# Patient Record
Sex: Male | Born: 1960
Health system: Southern US, Community
[De-identification: ages and names within clinical notes are randomized; demographics above are authoritative.]

## PROBLEM LIST (undated history)

## (undated) DIAGNOSIS — K2270 Barrett's esophagus without dysplasia: Secondary | ICD-10-CM

## (undated) DIAGNOSIS — K219 Gastro-esophageal reflux disease without esophagitis: Secondary | ICD-10-CM

## (undated) DIAGNOSIS — M1990 Unspecified osteoarthritis, unspecified site: Secondary | ICD-10-CM

## (undated) DIAGNOSIS — F329 Major depressive disorder, single episode, unspecified: Secondary | ICD-10-CM

## (undated) DIAGNOSIS — E039 Hypothyroidism, unspecified: Secondary | ICD-10-CM

## (undated) DIAGNOSIS — F32A Depression, unspecified: Secondary | ICD-10-CM

## (undated) DIAGNOSIS — I712 Thoracic aortic aneurysm, without rupture: Secondary | ICD-10-CM

## (undated) DIAGNOSIS — I10 Essential (primary) hypertension: Secondary | ICD-10-CM

## (undated) DIAGNOSIS — J449 Chronic obstructive pulmonary disease, unspecified: Secondary | ICD-10-CM

## (undated) HISTORY — DX: Barrett's esophagus without dysplasia: K22.70

## (undated) HISTORY — DX: Thoracic aortic aneurysm, without rupture: I71.2

## (undated) HISTORY — PX: HEMORRHOID SURGERY: SHX153

---

## 1999-02-03 ENCOUNTER — Encounter: Payer: Self-pay | Admitting: Gastroenterology

## 1999-02-03 ENCOUNTER — Ambulatory Visit (HOSPITAL_COMMUNITY): Admission: RE | Admit: 1999-02-03 | Discharge: 1999-02-03 | Payer: Self-pay | Admitting: Gastroenterology

## 1999-02-19 ENCOUNTER — Ambulatory Visit: Admission: RE | Admit: 1999-02-19 | Discharge: 1999-02-19 | Payer: Self-pay | Admitting: Gastroenterology

## 1999-05-01 ENCOUNTER — Emergency Department (HOSPITAL_COMMUNITY): Admission: EM | Admit: 1999-05-01 | Discharge: 1999-05-01 | Payer: Self-pay | Admitting: Emergency Medicine

## 1999-05-02 ENCOUNTER — Encounter: Payer: Self-pay | Admitting: Emergency Medicine

## 1999-09-29 DIAGNOSIS — K2270 Barrett's esophagus without dysplasia: Secondary | ICD-10-CM | POA: Insufficient documentation

## 1999-09-29 HISTORY — DX: Barrett's esophagus without dysplasia: K22.70

## 1999-09-29 HISTORY — PX: ESOPHAGOGASTRODUODENOSCOPY: SHX1529

## 1999-12-01 ENCOUNTER — Other Ambulatory Visit: Admission: RE | Admit: 1999-12-01 | Discharge: 1999-12-01 | Payer: Self-pay | Admitting: Gastroenterology

## 1999-12-01 ENCOUNTER — Encounter (INDEPENDENT_AMBULATORY_CARE_PROVIDER_SITE_OTHER): Payer: Self-pay | Admitting: Specialist

## 2000-12-10 ENCOUNTER — Emergency Department (HOSPITAL_COMMUNITY): Admission: EM | Admit: 2000-12-10 | Discharge: 2000-12-10 | Payer: Self-pay | Admitting: Emergency Medicine

## 2000-12-10 ENCOUNTER — Encounter: Payer: Self-pay | Admitting: Emergency Medicine

## 2002-06-26 ENCOUNTER — Observation Stay (HOSPITAL_COMMUNITY): Admission: EM | Admit: 2002-06-26 | Discharge: 2002-06-27 | Payer: Self-pay | Admitting: *Deleted

## 2002-06-26 ENCOUNTER — Encounter: Payer: Self-pay | Admitting: Internal Medicine

## 2002-12-17 ENCOUNTER — Emergency Department (HOSPITAL_COMMUNITY): Admission: EM | Admit: 2002-12-17 | Discharge: 2002-12-17 | Payer: Self-pay | Admitting: Emergency Medicine

## 2004-10-16 ENCOUNTER — Ambulatory Visit: Payer: Self-pay | Admitting: Gastroenterology

## 2005-02-13 ENCOUNTER — Emergency Department (HOSPITAL_COMMUNITY): Admission: EM | Admit: 2005-02-13 | Discharge: 2005-02-13 | Payer: Self-pay | Admitting: Family Medicine

## 2005-03-19 ENCOUNTER — Emergency Department (HOSPITAL_COMMUNITY): Admission: EM | Admit: 2005-03-19 | Discharge: 2005-03-19 | Payer: Self-pay | Admitting: Emergency Medicine

## 2005-11-09 ENCOUNTER — Emergency Department (HOSPITAL_COMMUNITY): Admission: EM | Admit: 2005-11-09 | Discharge: 2005-11-09 | Payer: Self-pay | Admitting: Emergency Medicine

## 2010-02-18 ENCOUNTER — Emergency Department (HOSPITAL_COMMUNITY): Admission: EM | Admit: 2010-02-18 | Discharge: 2010-02-18 | Payer: Self-pay | Admitting: Emergency Medicine

## 2010-10-28 ENCOUNTER — Emergency Department (HOSPITAL_COMMUNITY)
Admission: EM | Admit: 2010-10-28 | Discharge: 2010-10-28 | Payer: Self-pay | Source: Home / Self Care | Admitting: Emergency Medicine

## 2010-12-15 LAB — CBC
HCT: 40.5 % (ref 39.0–52.0)
Hemoglobin: 13.9 g/dL (ref 13.0–17.0)
MCHC: 34.3 g/dL (ref 30.0–36.0)
MCV: 83.4 fL (ref 78.0–100.0)
RBC: 4.85 MIL/uL (ref 4.22–5.81)
RDW: 12.9 % (ref 11.5–15.5)

## 2010-12-15 LAB — BASIC METABOLIC PANEL
BUN: 14 mg/dL (ref 6–23)
CO2: 26 mEq/L (ref 19–32)
Calcium: 9 mg/dL (ref 8.4–10.5)
Chloride: 104 mEq/L (ref 96–112)
Creatinine, Ser: 0.66 mg/dL (ref 0.4–1.5)
GFR calc Af Amer: >60 mL/min (ref >60)
GFR calc non Af Amer: >60 mL/min (ref >60)
Glucose, Bld: 122 mg/dL — ABNORMAL HIGH (ref 70–99)
Potassium: 3.8 mEq/L (ref 3.5–5.1)
Sodium: 137 mEq/L (ref 135–145)

## 2010-12-15 LAB — DIFFERENTIAL
Basophils Absolute: 0 10*3/uL (ref 0.0–0.1)
Basophils Relative: 1 % (ref 0–1)
Eosinophils Relative: 4 % (ref 0–5)
Monocytes Absolute: 0.5 10*3/uL (ref 0.1–1.0)
Monocytes Relative: 7 % (ref 3–12)

## 2011-11-16 ENCOUNTER — Encounter (HOSPITAL_COMMUNITY): Payer: Self-pay | Admitting: Surgery

## 2011-11-16 ENCOUNTER — Encounter (HOSPITAL_COMMUNITY): Payer: Self-pay | Admitting: Pharmacy Technician

## 2011-11-17 ENCOUNTER — Other Ambulatory Visit: Payer: Self-pay

## 2011-11-17 ENCOUNTER — Other Ambulatory Visit (HOSPITAL_COMMUNITY): Payer: Self-pay

## 2011-11-17 ENCOUNTER — Encounter (HOSPITAL_COMMUNITY): Payer: Self-pay

## 2011-11-17 ENCOUNTER — Ambulatory Visit (HOSPITAL_COMMUNITY)
Admission: RE | Admit: 2011-11-17 | Discharge: 2011-11-17 | Disposition: A | Discharge status: Home / Self Care | Payer: PRIVATE HEALTH INSURANCE | Source: Ambulatory Visit | Attending: Anesthesiology | Admitting: Anesthesiology

## 2011-11-17 ENCOUNTER — Encounter (HOSPITAL_COMMUNITY)
Admission: RE | Admit: 2011-11-17 | Discharge: 2011-11-17 | Disposition: A | Discharge status: Home / Self Care | Payer: PRIVATE HEALTH INSURANCE | Source: Ambulatory Visit | Attending: Oral Surgery | Admitting: Oral Surgery

## 2011-11-17 DIAGNOSIS — J4489 Other specified chronic obstructive pulmonary disease: Secondary | ICD-10-CM | POA: Insufficient documentation

## 2011-11-17 DIAGNOSIS — Z01812 Encounter for preprocedural laboratory examination: Secondary | ICD-10-CM | POA: Insufficient documentation

## 2011-11-17 DIAGNOSIS — Z01810 Encounter for preprocedural cardiovascular examination: Secondary | ICD-10-CM | POA: Insufficient documentation

## 2011-11-17 DIAGNOSIS — Z01818 Encounter for other preprocedural examination: Secondary | ICD-10-CM | POA: Insufficient documentation

## 2011-11-17 DIAGNOSIS — J449 Chronic obstructive pulmonary disease, unspecified: Secondary | ICD-10-CM | POA: Insufficient documentation

## 2011-11-17 DIAGNOSIS — I10 Essential (primary) hypertension: Secondary | ICD-10-CM | POA: Insufficient documentation

## 2011-11-17 LAB — BASIC METABOLIC PANEL
BUN: 17 mg/dL (ref 6–23)
Calcium: 10 mg/dL (ref 8.4–10.5)
Creatinine, Ser: 0.77 mg/dL (ref 0.50–1.35)
GFR calc Af Amer: >90 mL/min (ref >90)

## 2011-11-17 LAB — CBC
HCT: 41.1 % (ref 39.0–52.0)
MCHC: 33.1 g/dL (ref 30.0–36.0)
MCV: 83.4 fL (ref 78.0–100.0)
Platelets: 186 10*3/uL (ref 150–400)
RDW: 12.7 % (ref 11.5–15.5)
WBC: 7 10*3/uL (ref 4.0–10.5)

## 2011-11-17 NOTE — Consult Note (Addendum)
Anesthesia:  Patient is a 51 year old male scheduled for multiple teeth extractions (15) and alveoloplasty on 11/30/11.  History includes hypothyroidism, GERD, former smoker (quit in 2005), COPD, SOB, depression, and questionable for HTN (listed on Dr. Randa Evens notes but patient is not on any meds and his BP is 114/75 today).  His PCP is Dr. Fleet Contras at the [facility] [phone]).   During his PAT appointment today he mentioned an episode of chest pain that occurred 4 days ago while working on the [employer].  He is doing temporary work at [employer] and that particular evening he was having to lift and move heavy boxes.  He developed substernal pain that lasted a few minutes.  He could not characterize his pain for me except to say it was non-radiating and was a 2-3/10.  It went away without specific treatment.  He did not have any associated symptoms such as nausea, palpitations, presyncope, or jaw or arm pain.  He was sweating, but says that was related to work.  He had prior CP in 2003, but reportedly ruled out for MI.  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  He does have a family history (father) of MI X 2, first around age 14.  He denies SOB at rest, but has some DOE with moderate exertion.   He denies LE edema.   Labs are acceptable.  Glucose is 100.  CXR shows no active disease.  EKG shows SB, LAD.  It does not appear significantly changed since May 2011.  Exam shows his heart with a RRR, no murmur noted, lungs clear, no carotid bruits noted.    He initially was medically cleared by Dr. Concepcion Elk, but this was prior to his episode of chest pain.  Due to his recent chest pain with family history of MI before age 19 would recommend further evaluation pre-operatively.  This could either be a Cardiology evaluation with additional testing per their orders or an out-patient stress test arranged by his PCP.  An appointment has been made for him to  see Dr. Antoine Poche on 11/30/11 at 11:15 AM; however this would require him to reschedule his dental procedure.  He is also concerned about the cost.  His primary insurance is [employer] [phone], rep. Olivia Canter).  I contacted Dr. Albertina Parr office and have been told that any appointments need pre-approval, and that Dr. Concepcion Elk could not even see him or order a stress test unless it was approved.  I have left Olivia Canter a message to contact me to clarify what needs to be done for him to get his exertional CP further evaluated.  In the meantime, Mr. Salmons is aware that he should contact EMS for recurrent CP symptoms.  I have updated Anesthesiologist Dr. Ivin Booty and Lyla Son at Dr. Randa Evens office.    I'll follow-up once I hear back from [facility].  Addendum: 11/18/11 1300  I spoke with Olivia Canter earlier today.  They will approve a pre-operative Cardiology evaluation.  She was able to reschedule Mr. Seales appointment to 11/25/11 @ 2:30PM with Dr. Charlton Haws.  I've asked the Short Stay staff to follow-up with his recommendations.  I have notified Mr. Delman and updated Lyla Son at Dr. Randa Evens office.

## 2011-11-17 NOTE — Pre-Procedure Instructions (Signed)
20 Darren [name]  11/17/2011   Your procedure is scheduled on:  Monday  11/30/11   Report to [facility] at 530 AM.  Call this number if you have problems the morning of surgery: [phone]   Remember:   Do not eat food:After Midnight.  May have clear liquids: up to 4 Hours before arrival.  Clear liquids include soda, tea, black coffee, apple or grape juice, broth.  Take these medicines the morning of surgery with A SIP OF WATER:  Albuterol, symbicort, lexapro, pepcid, synthroid   Do not wear jewelry, make-up or nail polish.  Do not wear lotions, powders, or perfumes. You may wear deodorant.  Do not shave 48 hours prior to surgery.  Do not bring valuables to the hospital.  Contacts, dentures or bridgework may not be worn into surgery.  Leave suitcase in the car. After surgery it may be brought to your room.  For patients admitted to the hospital, checkout time is 11:00 AM the day of discharge.   Patients discharged the day of surgery will not be allowed to drive home.  Name and phone number of your driver:   Special Instructions: CHG Shower Use Special Wash: 1/2 bottle night before surgery and 1/2 bottle morning of surgery.   Please read over the following fact sheets that you were given: Pain Booklet, MRSA Information and Surgical Site Infection Prevention

## 2011-11-17 NOTE — Progress Notes (Signed)
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 PATIENT STATES  HE HAS ONLY HAD STRESS TEST X1 YEARS AGO.  PATIENT [facility].

## 2011-11-18 ENCOUNTER — Telehealth: Payer: Self-pay | Admitting: *Deleted

## 2011-11-18 NOTE — Telephone Encounter (Signed)
Pt rescheduled to 2/27 at 2:30 pm with Dr Eden Emms.  Left message for Beth on her voicemail and for pt at home number (per Glynda Jaeger)

## 2011-11-18 NOTE — Telephone Encounter (Signed)
New msg [facility] wanted to talk to you about surgery he has scheduled. Please call

## 2011-11-18 NOTE — Telephone Encounter (Signed)
Per Beth ext [phone] - pt needs surgical clearance before 11/30/2011 which is when he is scheduled to see Dr Antoine Poche.  Glynda Jaeger aware and is working on re-scheduling the pt.

## 2011-11-25 ENCOUNTER — Encounter: Payer: Self-pay | Admitting: Cardiovascular Disease

## 2011-11-25 ENCOUNTER — Ambulatory Visit (INDEPENDENT_AMBULATORY_CARE_PROVIDER_SITE_OTHER): Payer: PRIVATE HEALTH INSURANCE | Admitting: Cardiovascular Disease

## 2011-11-25 DIAGNOSIS — R079 Chest pain, unspecified: Secondary | ICD-10-CM | POA: Insufficient documentation

## 2011-11-25 DIAGNOSIS — J45909 Unspecified asthma, uncomplicated: Secondary | ICD-10-CM | POA: Insufficient documentation

## 2011-11-25 DIAGNOSIS — Z01818 Encounter for other preprocedural examination: Secondary | ICD-10-CM

## 2011-11-25 DIAGNOSIS — K219 Gastro-esophageal reflux disease without esophagitis: Secondary | ICD-10-CM | POA: Insufficient documentation

## 2011-11-25 NOTE — Progress Notes (Signed)
Patient ID: Darren [name], male   DOB: 06-Aug-1961, 51 y.o.   MRN: [id number] 51 yo needs oral surgery.  Had some SSCP when pulling a heavy cart at work.  Had stress test many years ago  Nonsmoker.  Previous hemmoroid surgery in 80's with no complications.  Will have ? General anesthesia for procedure because of the extensive tooth decay.  Mild exertional dyspnea.  Does get occasional pressure when walking his  American bulldog Chance.  No palpitations syncope or edema.    ROS: Denies fever, malais, weight loss, blurry vision, decreased visual acuity, cough, sputum, SOB, hemoptysis, pleuritic pain, palpitaitons, heartburn, abdominal pain, melena, lower extremity edema, claudication, or rash.  All other systems reviewed and negative   General: Affect appropriate Healthy:  appears stated age HEENT: normal Neck supple with no adenopathy JVP normal no bruits no thyromegaly Lungs clear with no wheezing and good diaphragmatic motion Heart:  S1/S2 no murmur,rub, gallop or click PMI normal Abdomen: benighn, BS positve, no tenderness, no AAA no bruit.  No HSM or HJR Distal pulses intact with no bruits No edema Neuro non-focal Skin warm and dry No muscular weakness  Medications Current Outpatient Prescriptions  Medication Sig Dispense Refill  . albuterol (PROVENTIL HFA;VENTOLIN HFA) 108 (90 BASE) MCG/ACT inhaler Inhale 2 puffs into the lungs every 4 (four) hours as needed. For shortness of breath      . budesonide-formoterol (SYMBICORT) 160-4.5 MCG/ACT inhaler Inhale 2 puffs into the lungs 2 (two) times daily.      Marland Kitchen escitalopram (LEXAPRO) 10 MG tablet Take 20 mg by mouth daily.      . famotidine (PEPCID) 20 MG tablet Take 20 mg by mouth daily. For reflux       . levothyroxine (SYNTHROID, LEVOTHROID) 175 MCG tablet Take 175 mcg by mouth daily.      . naproxen sodium (ANAPROX) 220 MG tablet Take 220 mg by mouth 2 (two) times daily with a meal.        Allergies Review of patient's allergies  indicates no known allergies.  Family History: No family history on file.  Social History: History   Social History  . Marital Status: Single    Spouse Name: N/A    Number of Children: N/A  . Years of Education: N/A   Occupational History  . Not on file.   Social History Main Topics  . Smoking status: Former Games developer  . Smokeless tobacco: Not on file  . Alcohol Use: Yes     ON WEEKENDS  . Drug Use: No  . Sexually Active:    Other Topics Concern  . Not on file   Social History Narrative  . No narrative on file    Electrocardiogram:  11/17/11  Normal sinus rhythm  Normal ecg  Assessment and Plan

## 2011-11-25 NOTE — Assessment & Plan Note (Signed)
Continue H2 blocker and low carb diet 

## 2011-11-25 NOTE — Patient Instructions (Signed)
Your physician recommends that you schedule a follow-up appointment in: AS NEEDED Your physician recommends that you continue on your current medications as directed. Please refer to the Current Medication list given to you today Your physician has requested that you have an exercise tolerance test. For further information please visit [email or web address] Please also follow instruction sheet, as given. DX PRE OP  CHEST PAIN

## 2011-11-25 NOTE — Assessment & Plan Note (Signed)
PRN inhaler stopped smoking 8 years ago.  F/U primary

## 2011-11-25 NOTE — Assessment & Plan Note (Signed)
Atypical  F/U exercise treadmill

## 2011-11-26 ENCOUNTER — Encounter: Payer: PRIVATE HEALTH INSURANCE | Admitting: Cardiovascular Disease

## 2011-11-26 ENCOUNTER — Ambulatory Visit (INDEPENDENT_AMBULATORY_CARE_PROVIDER_SITE_OTHER): Payer: PRIVATE HEALTH INSURANCE | Admitting: Cardiovascular Disease

## 2011-11-26 ENCOUNTER — Encounter: Payer: Self-pay | Admitting: *Deleted

## 2011-11-26 DIAGNOSIS — R079 Chest pain, unspecified: Secondary | ICD-10-CM

## 2011-11-26 DIAGNOSIS — Z01818 Encounter for other preprocedural examination: Secondary | ICD-10-CM

## 2011-11-26 NOTE — Progress Notes (Signed)
Exercise Treadmill Test  Pre-Exercise Testing Evaluation Rhythm: normal sinus  Rate: 72   PR:  20 QRS:  .04  QT:  .35 QTc: .36   P axis: +45 degrees  QRS axis:  +60 degrees  ST Segments:  no significant ST changes at rest     Test  Exercise Tolerance Test Ordering MD: Charlton Haws, MD  Interpreting MD:  Charlton Haws, MD  Unique Test No: 1 Treadmill:  1  Indication for ETT: chest pain - rule out ischemia Contraindication to ETT: No   Stress Modality: exercise - treadmill  Cardiac Imaging Performed: non   Protocol: standard [name] - maximal  Max BP:  178/88   Max MPHR (bpm):  170 85% MPR (bpm):  145  MPHR obtained (bpm):  158 % MPHR obtained:  93  Reached 85% MPHR (min:sec):  5:00 Total Exercise Time (min-sec):  6:00  Workload in METS:  7.0 Borg Scale: 16  Reason ETT Terminated:  fatigue    ST Segment Analysis At Rest: normal ST segments - no evidence of significant ST depression With Exercise: no evidence of significant ST depression  Other Information Arrhythmia:  No Angina during ETT:  absent (0) Quality of ETT:  diagnostic  ETT Interpretation:  normal - no evidence of ischemia by ST analysis  Comments: Patient had difficulty negotiating treadmill  Recommendations: Normal ETT with no evidence of ischemia

## 2011-11-26 NOTE — H&P (Signed)
HISTORY AND PHYSICAL  Darren [name] is a 51 y.o. male patient with [id number] teeth.  No diagnosis found.  Past Medical History  Diagnosis Date  . Hypothyroidism     per Medical Clearance form.  Marland Kitchen GERD (gastroesophageal reflux disease)     per Medical clearance form.  . Arthritis     per Medical clearance form.  Marland Kitchen COPD (chronic obstructive pulmonary disease)     per medical clearance form.  . Depression     per Medical Clearance form(09/18/11)  . Hypertension     Per Medical Clearance form.  . Shortness of breath     WITH EXERTION   . Frequency of urination   . Angina     PATIENT STATES HE HAD EPISODE OF PAIN IN CHEST WHEN PULLING AND STRAINING AT WORK    No current facility-administered medications for this encounter.   Current Outpatient Prescriptions  Medication Sig Dispense Refill  . albuterol (PROVENTIL HFA;VENTOLIN HFA) 108 (90 BASE) MCG/ACT inhaler Inhale 2 puffs into the lungs every 4 (four) hours as needed. For shortness of breath      . budesonide-formoterol (SYMBICORT) 160-4.5 MCG/ACT inhaler Inhale 2 puffs into the lungs 2 (two) times daily.      Marland Kitchen escitalopram (LEXAPRO) 10 MG tablet Take 20 mg by mouth daily.      . famotidine (PEPCID) 20 MG tablet Take 20 mg by mouth daily. For reflux       . levothyroxine (SYNTHROID, LEVOTHROID) 175 MCG tablet Take 175 mcg by mouth daily.      . naproxen sodium (ANAPROX) 220 MG tablet Take 220 mg by mouth 2 (two) times daily with a meal.       No Known Allergies Active Problems:  * No active hospital problems. *   Vitals: There were no vitals taken for this visit. Lab results:No results found for this or any previous visit (from the past 24 hour(s)). Radiology Results: No results found. General appearance: alert, cooperative and mildly obese Head: Normocephalic, without obvious abnormality, atraumatic Eyes: conjunctivae/corneas clear. PERRL, EOM's intact. Fundi benign. Ears: normal TM's and external ear canals both  ears Nose: Nares normal. Septum midline. Mucosa normal. No drainage or sinus tenderness. Throat: Gross dental caries teeth #'s 2, 3, 4, 5, 6, 7, 8, 9, 10, 11, 12, 13, 15, 18, 31 Neck: no adenopathy, no carotid bruit, no JVD, supple, symmetrical, trachea midline and thyroid not enlarged, symmetric, no tenderness/mass/nodules Resp: clear to auscultation bilaterally Cardio: regular rate and rhythm, S1, S2 normal, no murmur, click, rub or gallop  Assessment: 50 WM HTN, Depression GERD, Angina with nonrestorable teeth  #'s 2, 3, 4, 5, 6, 7, 8, 9, 10, 11, 12, 13, 15, 18, 31. Plan: Extraction teeth #'s  2, 3, 4, 5, 6, 7, 8, 9, 10, 11, 12, 13, 15, 18, 31 alveoloplasty. General anesthesia. Day surgery.   Georgia Lopes 11/26/2011

## 2011-11-30 ENCOUNTER — Ambulatory Visit (HOSPITAL_COMMUNITY)
Admission: RE | Admit: 2011-11-30 | Discharge: 2011-11-30 | Disposition: A | Discharge status: Home / Self Care | Payer: PRIVATE HEALTH INSURANCE | Source: Ambulatory Visit | Attending: Oral Surgery | Admitting: Oral Surgery

## 2011-11-30 ENCOUNTER — Encounter (HOSPITAL_COMMUNITY): Payer: Self-pay | Admitting: Vascular Surgery

## 2011-11-30 ENCOUNTER — Ambulatory Visit: Payer: PRIVATE HEALTH INSURANCE | Admitting: Cardiology

## 2011-11-30 ENCOUNTER — Encounter (HOSPITAL_COMMUNITY)
Admission: RE | Disposition: A | Discharge status: Home / Self Care | Payer: Self-pay | Source: Ambulatory Visit | Attending: Oral Surgery

## 2011-11-30 ENCOUNTER — Ambulatory Visit (HOSPITAL_COMMUNITY): Payer: PRIVATE HEALTH INSURANCE | Admitting: Vascular Surgery

## 2011-11-30 DIAGNOSIS — J449 Chronic obstructive pulmonary disease, unspecified: Secondary | ICD-10-CM | POA: Insufficient documentation

## 2011-11-30 DIAGNOSIS — F329 Major depressive disorder, single episode, unspecified: Secondary | ICD-10-CM | POA: Insufficient documentation

## 2011-11-30 DIAGNOSIS — Z79899 Other long term (current) drug therapy: Secondary | ICD-10-CM | POA: Insufficient documentation

## 2011-11-30 DIAGNOSIS — F3289 Other specified depressive episodes: Secondary | ICD-10-CM | POA: Insufficient documentation

## 2011-11-30 DIAGNOSIS — E039 Hypothyroidism, unspecified: Secondary | ICD-10-CM | POA: Insufficient documentation

## 2011-11-30 DIAGNOSIS — K029 Dental caries, unspecified: Secondary | ICD-10-CM

## 2011-11-30 DIAGNOSIS — M129 Arthropathy, unspecified: Secondary | ICD-10-CM | POA: Insufficient documentation

## 2011-11-30 DIAGNOSIS — I10 Essential (primary) hypertension: Secondary | ICD-10-CM | POA: Insufficient documentation

## 2011-11-30 DIAGNOSIS — K219 Gastro-esophageal reflux disease without esophagitis: Secondary | ICD-10-CM | POA: Insufficient documentation

## 2011-11-30 DIAGNOSIS — J4489 Other specified chronic obstructive pulmonary disease: Secondary | ICD-10-CM | POA: Insufficient documentation

## 2011-11-30 HISTORY — DX: Essential (primary) hypertension: I10

## 2011-11-30 HISTORY — DX: Unspecified osteoarthritis, unspecified site: M19.90

## 2011-11-30 HISTORY — DX: Chronic obstructive pulmonary disease, unspecified: J44.9

## 2011-11-30 HISTORY — DX: Depression, unspecified: F32.A

## 2011-11-30 HISTORY — DX: Major depressive disorder, single episode, unspecified: F32.9

## 2011-11-30 HISTORY — DX: Gastro-esophageal reflux disease without esophagitis: K21.9

## 2011-11-30 HISTORY — PX: MULTIPLE EXTRACTIONS WITH ALVEOLOPLASTY: SHX5342

## 2011-11-30 HISTORY — DX: Hypothyroidism, unspecified: E03.9

## 2011-11-30 SURGERY — MULTIPLE EXTRACTION WITH ALVEOLOPLASTY
Anesthesia: General | Site: Mouth | Laterality: Bilateral | Wound class: Clean Contaminated

## 2011-11-30 MED ORDER — PROPOFOL 10 MG/ML IV EMUL
INTRAVENOUS | Status: DC | PRN
  Administered 2011-11-30: 180 mg via INTRAVENOUS

## 2011-11-30 MED ORDER — ROCURONIUM BROMIDE 100 MG/10ML IV SOLN
INTRAVENOUS | Status: DC | PRN
  Administered 2011-11-30: 30 mg via INTRAVENOUS

## 2011-11-30 MED ORDER — OXYMETAZOLINE HCL 0.05 % NA SOLN
NASAL | Status: DC | PRN
  Administered 2011-11-30: 2 via NASAL

## 2011-11-30 MED ORDER — OXYCODONE-ACETAMINOPHEN 5-325 MG PO TABS: 1.0000 | ORAL_TABLET | ORAL | Status: AC | PRN

## 2011-11-30 MED ORDER — ONDANSETRON HCL 4 MG/2ML IJ SOLN
INTRAMUSCULAR | Status: DC | PRN
  Administered 2011-11-30: 4 mg via INTRAVENOUS

## 2011-11-30 MED ORDER — SODIUM CHLORIDE 0.9 % IR SOLN
Status: DC | PRN
  Administered 2011-11-30: 1000 mL

## 2011-11-30 MED ORDER — LIDOCAINE-EPINEPHRINE 2 %-1:100000 IJ SOLN
INTRAMUSCULAR | Status: DC | PRN
  Administered 2011-11-30: 16 mL

## 2011-11-30 MED ORDER — FENTANYL CITRATE 0.05 MG/ML IJ SOLN
INTRAMUSCULAR | Status: DC | PRN
  Administered 2011-11-30 (×2): 50 ug via INTRAVENOUS

## 2011-11-30 MED ORDER — LACTATED RINGERS IV SOLN
INTRAVENOUS | Status: DC | PRN
  Administered 2011-11-30 (×2): via INTRAVENOUS

## 2011-11-30 MED ORDER — MIDAZOLAM HCL 5 MG/5ML IJ SOLN
INTRAMUSCULAR | Status: DC | PRN
  Administered 2011-11-30: 1 mg via INTRAVENOUS

## 2011-11-30 MED ORDER — CEFAZOLIN SODIUM 1-5 GM-% IV SOLN
INTRAVENOUS | Status: DC | PRN
  Administered 2011-11-30: 1 g via INTRAVENOUS

## 2011-11-30 MED ORDER — CEFAZOLIN SODIUM 1-5 GM-% IV SOLN
INTRAVENOUS | Status: AC
  Filled 2011-11-30: qty 50

## 2011-11-30 SURGICAL SUPPLY — 28 items
BUR CROSS CUT FISSURE 1.6 (BURR) ×2 IMPLANT
BUR EGG ELITE 4.0 (BURR) ×2 IMPLANT
CANISTER SUCTION 2500CC (MISCELLANEOUS) ×2 IMPLANT
CLOTH BEACON ORANGE TIMEOUT ST (SAFETY) ×2 IMPLANT
COVER SURGICAL LIGHT HANDLE (MISCELLANEOUS) ×2 IMPLANT
CRADLE DONUT ADULT HEAD (MISCELLANEOUS) ×2 IMPLANT
DECANTER SPIKE VIAL GLASS SM (MISCELLANEOUS) ×2 IMPLANT
GAUZE PACKING FOLDED 2  STR (GAUZE/BANDAGES/DRESSINGS) ×1
GAUZE PACKING FOLDED 2 STR (GAUZE/BANDAGES/DRESSINGS) ×1 IMPLANT
GLOVE BIO SURGEON STRL SZ 6.5 (GLOVE) ×2 IMPLANT
GLOVE BIO SURGEON STRL SZ7 (GLOVE) ×1 IMPLANT
GLOVE BIO SURGEON STRL SZ7.5 (GLOVE) ×2 IMPLANT
GLOVE BIOGEL PI IND STRL 7.0 (GLOVE) ×1 IMPLANT
GLOVE BIOGEL PI INDICATOR 7.0 (GLOVE) ×1
GOWN STRL NON-REIN LRG LVL3 (GOWN DISPOSABLE) ×4 IMPLANT
GOWN STRL REIN XL XLG (GOWN DISPOSABLE) ×2 IMPLANT
KIT BASIN OR (CUSTOM PROCEDURE TRAY) ×2 IMPLANT
KIT ROOM TURNOVER OR (KITS) ×2 IMPLANT
NEEDLE 22X1 1/2 (OR ONLY) (NEEDLE) ×2 IMPLANT
NS IRRIG 1000ML POUR BTL (IV SOLUTION) ×2 IMPLANT
PAD ARMBOARD 7.5X6 YLW CONV (MISCELLANEOUS) ×4 IMPLANT
SUT CHROMIC 3 0 PS 2 (SUTURE) ×3 IMPLANT
SYR 50ML SLIP (SYRINGE) IMPLANT
TOWEL OR 17X26 10 PK STRL BLUE (TOWEL DISPOSABLE) ×2 IMPLANT
TRAY ENT MC OR (CUSTOM PROCEDURE TRAY) ×2 IMPLANT
TUBING IRRIGATION (MISCELLANEOUS) ×1 IMPLANT
WATER STERILE IRR 1000ML POUR (IV SOLUTION) IMPLANT
YANKAUER SUCT BULB TIP NO VENT (SUCTIONS) ×2 IMPLANT

## 2011-11-30 NOTE — Transfer of Care (Signed)
Immediate Anesthesia Transfer of Care Note  Patient: Darren [name]  Procedure(s) Performed: Procedure(s) (LRB): MULTIPLE EXTRACION WITH ALVEOLOPLASTY (Bilateral)  Patient Location: PACU  Anesthesia Type: General  Level of Consciousness: awake, alert  and oriented  Airway & Oxygen Therapy: Patient Spontanous Breathing and Patient connected to face mask oxygen  Post-op Assessment: Report given to PACU RN, Post -op Vital signs reviewed and stable and Patient moving all extremities X 4  Post vital signs: Reviewed and stable  Complications: No apparent anesthesia complications

## 2011-11-30 NOTE — H&P (Signed)
H&P documentation  -History and Physical Reviewed  -Patient has been re-examined  -No change in the plan of care  Darren [name]  

## 2011-11-30 NOTE — Anesthesia Postprocedure Evaluation (Signed)
Anesthesia Post Note  Patient: Darren [name]  Procedure(s) Performed: Procedure(s) (LRB): MULTIPLE EXTRACION WITH ALVEOLOPLASTY (Bilateral)  Anesthesia type: General  Patient location: PACU  Post pain: Pain level controlled and Adequate analgesia  Post assessment: Post-op Vital signs reviewed, Patient's Cardiovascular Status Stable, Respiratory Function Stable, Patent Airway and Pain level controlled  Last Vitals:  Filed Vitals:   11/30/11 1019  BP:   Pulse: 82  Temp:   Resp: 15    Post vital signs: Reviewed and stable  Level of consciousness: awake, alert  and oriented  Complications: No apparent anesthesia complications

## 2011-11-30 NOTE — Op Note (Signed)
11/30/2011  9:38 AM  PATIENT:  Darren [name]  51 y.o. male  PRE-OPERATIVE DIAGNOSIS:  NONRESTORABLE TEETH #'s 2, 3, 4, 5, 6, 7, 8, 9, 10, 11, 12, 13, 15, 18, 31  POST-OPERATIVE DIAGNOSIS:  SAME  PROCEDURE:  Procedure(s): MULTIPLE EXTRACTIONNONRESTORABLE TEETH #'s 2, 3, 4, 5, 6, 7, 8, 9, 10, 11, 12, 13, 15, 18, 31  WITH ALVEOLOPLASTY   SURGEON:  Surgeon(s): Georgia Lopes, DDS  ANESTHESIA:   local and general  EBL:  minimal  DRAINS: none   LOCAL MEDICATIONS USED:  LIDOCAINE 16 CC  SPECIMEN:  No Specimen  COUNTS:  YES  PLAN OF CARE: Discharge to home after PACU  PATIENT DISPOSITION:  PACU - hemodynamically stable.   PROCEDURE DETAILS: Dictation # [id number]  Georgia Lopes, DMD 11/30/2011 9:38 AM

## 2011-11-30 NOTE — Anesthesia Preprocedure Evaluation (Addendum)
Anesthesia Evaluation  Patient identified by MRN, date of birth, ID band Patient awake    Reviewed: Allergy & Precautions, H&P , NPO status , Patient's Chart, lab work & pertinent test results  Airway Mallampati: II TM Distance: >3 FB Neck ROM: full    Dental  (+) Poor Dentition, Chipped and Dental Advisory Given   Pulmonary shortness of breath, asthma , COPDformer smoker         Cardiovascular hypertension,     Neuro/Psych PSYCHIATRIC DISORDERS Depression    GI/Hepatic GERD-  ,  Endo/Other  Hypothyroidism   Renal/GU      Musculoskeletal   Abdominal   Peds  Hematology   Anesthesia Other Findings   Reproductive/Obstetrics                          Anesthesia Physical Anesthesia Plan  ASA: II  Anesthesia Plan: General   Post-op Pain Management:    Induction: Intravenous  Airway Management Planned: Oral ETT  Additional Equipment:   Intra-op Plan:   Post-operative Plan: Extubation in OR  Informed Consent: I have reviewed the patients History and Physical, chart, labs and discussed the procedure including the risks, benefits and alternatives for the proposed anesthesia with the patient or authorized representative who has indicated his/her understanding and acceptance.     Plan Discussed with: CRNA and Surgeon  Anesthesia Plan Comments:         Anesthesia Quick Evaluation

## 2011-11-30 NOTE — Op Note (Signed)
NAME:  Darren [name] NO.:  [id number]  MEDICAL RECORD NO.:  [id number]  LOCATION:  [facility]                         FACILITY:  [facility]  PHYSICIAN:  Georgia Lopes, M.D.  DATE OF BIRTH:  May 25, 1961  DATE OF PROCEDURE:  11/30/2011 DATE OF DISCHARGE:  11/30/2011                              OPERATIVE REPORT   PREOPERATIVE DIAGNOSIS:  Nonrestorable teeth numbers 2, 3, 4, 5, 6, 7, 8, 9, 10, 11, 12, 13, 15, 18, 31.  POSTOPERATIVE DIAGNOSIS:  Nonrestorable teeth numbers 2, 3, 4, 5, 6, 7, 8, 9, 10, 11, 12, 13, 15, 18, 31.  PROCEDURE:  Removal of nonrestorable teeth numbers 2, 3, 4, 5, 6, 7, 8, 9, 10, 11, 12, 13, 15, 18, 31.  Alveoplasty, right and left maxilla.  SURGEON:  Georgia Lopes, M.D.  ANESTHESIA:  General, nasal intubation.  INDICATIONS FOR PROCEDURE:  Darren [name] is a 51 year old male with past medical history significant for hypertension, GERD and depression, who is referred by his general dentist for removal of multiple teeth secondary to dental decay and multiple abscesses.  Because of the number of teeth to be removed and the amount of surgery, it was recommended that the patient undergo the procedure while under general anesthesia and with intubation for airway protection.  PROCEDURE IN DETAIL:  The patient was taken to the operating room and placed on the table in supine position.  General anesthesia was administered intravenously and then the nasal endotracheal tube was placed and secured.  The eyes were protected.  The patient was draped for the procedure.  A time-out was performed.  Posterior pharynx was suctioned with Yankauer suction and a throat-pack was placed.  Lidocaine 2% with 1:100,000 epinephrine was infiltrated in an inferior alveolar block on the right and left side and in the maxilla and in buccal and palatal infiltration around the teeth to be removed.  A total of 16 mL was utilized.  A bite-block was placed on the right side of the  mouth. A sweetheart retractor was used to retract the tongue.  A 15 blade was used make a full-thickness incision around tooth number 18 in the mandible on the buccal and lingual aspects and around teeth numbers 15, 13, 12, 11, 10, 9, 8, 7 in the maxilla on the buccal and palatal aspects.  The periosteum was reflected from around these teeth with a periosteal elevator.  Bone was removed with a Stryker handpiece around the posterior teeth in the maxilla and mandible, and then the teeth were elevated with a 301 elevator and removed from the mouth with the upper universal forceps.  The sockets were then irrigated and curetted, and the periosteum was reflected to expose the alveolar bone in the maxilla. An egg-shape bur and bone file were used to perform alveoplasty and then the area was irrigated and closed with 3-0 chromic.  The bite-block and sweetheart were repositioned to the other side of the mouth and a 15 blade was used to make a full-thickness incision around tooth number 31 on the buccal and lingual aspects, and around teeth numbers 2, 3, 4, 5 and 6 in the maxilla on the buccal and palatal aspects.  The periosteum was then reflected with a periosteal elevator and a proximal bone was removed around these teeth with the Stryker handpiece under irrigation. The 301 elevator was used to elevate the teeth and the teeth were removed with the upper universal forceps in the maxilla and the cow-horn forceps in the mandible.  The sockets were then curetted and periosteum was further reflected to expose the alveolar bone in the maxilla.  The egg-shape bur and the bone file were used to perform the alveoplasty in this area.  Then, the areas were irrigated and closed with 3-0 chromic and the oral cavity was inspected and found to have good contour and closure.  The oral cavity was irrigated and suctioned.  Throat pack was removed.  The patient was awakened and taken to the recovery  room, breathing spontaneously, in good condition.  ESTIMATED BLOOD LOSS:  Minimum.  COMPLICATIONS:  None.  SPECIMENS:  None.     Georgia Lopes, M.D.     SMJ/MEDQ  D:  11/30/2011  T:  11/30/2011  Job:  [id number]

## 2011-12-02 ENCOUNTER — Encounter (HOSPITAL_COMMUNITY): Payer: Self-pay | Admitting: Oral Surgery

## 2013-09-26 ENCOUNTER — Telehealth: Payer: Self-pay | Admitting: Emergency Medicine

## 2013-09-26 ENCOUNTER — Other Ambulatory Visit: Payer: Self-pay | Admitting: Emergency Medicine

## 2013-09-26 MED ORDER — LEVOTHYROXINE SODIUM 175 MCG PO TABS: 175.0000 ug | ORAL_TABLET | Freq: Every day | ORAL | Status: DC

## 2013-09-26 NOTE — Telephone Encounter (Signed)
New pt called requesting requesting Levothyroxine refill until first scheduled appt. Script e-scribed and sent to [facility]

## 2013-10-17 ENCOUNTER — Ambulatory Visit: Payer: PRIVATE HEALTH INSURANCE | Attending: Internal Medicine

## 2013-10-21 ENCOUNTER — Encounter (HOSPITAL_COMMUNITY): Payer: Self-pay | Admitting: Emergency Medicine

## 2013-10-21 ENCOUNTER — Emergency Department (INDEPENDENT_AMBULATORY_CARE_PROVIDER_SITE_OTHER)
Admission: EM | Admit: 2013-10-21 | Discharge: 2013-10-21 | Disposition: A | Discharge status: Home / Self Care | Payer: Self-pay | Source: Home / Self Care | Attending: Emergency Medicine | Admitting: Emergency Medicine

## 2013-10-21 DIAGNOSIS — M5412 Radiculopathy, cervical region: Secondary | ICD-10-CM

## 2013-10-21 DIAGNOSIS — H6693 Otitis media, unspecified, bilateral: Secondary | ICD-10-CM

## 2013-10-21 DIAGNOSIS — E039 Hypothyroidism, unspecified: Secondary | ICD-10-CM

## 2013-10-21 DIAGNOSIS — J069 Acute upper respiratory infection, unspecified: Secondary | ICD-10-CM

## 2013-10-21 MED ORDER — TRAMADOL HCL 50 MG PO TABS: 100.0000 mg | ORAL_TABLET | Freq: Three times a day (TID) | ORAL | Status: DC | PRN

## 2013-10-21 MED ORDER — BENZONATATE 200 MG PO CAPS: 200.0000 mg | ORAL_CAPSULE | Freq: Three times a day (TID) | ORAL | Status: DC | PRN

## 2013-10-21 MED ORDER — LEVOTHYROXINE SODIUM 175 MCG PO TABS: 175.0000 ug | ORAL_TABLET | Freq: Every day | ORAL | Status: DC

## 2013-10-21 MED ORDER — AMOXICILLIN 500 MG PO CAPS: 1000.0000 mg | ORAL_CAPSULE | Freq: Three times a day (TID) | ORAL | Status: DC

## 2013-10-21 NOTE — Discharge Instructions (Signed)
Cervical Radiculopathy Cervical radiculopathy happens when a nerve in the neck is pinched or bruised by a slipped (herniated) disk or by arthritic changes in the bones of the cervical spine. This can occur due to an injury or as part of the normal aging process. Pressure on the cervical nerves can cause pain or numbness that runs from your neck all the way down into your arm and fingers. CAUSES  There are many possible causes, including:  Injury.  Muscle tightness in the neck from overuse.  Swollen, painful joints (arthritis).  Breakdown or degeneration in the bones and joints of the spine (spondylosis) due to aging.  Bone spurs that may develop near the cervical nerves. SYMPTOMS  Symptoms include pain, weakness, or numbness in the affected arm and hand. Pain can be severe or irritating. Symptoms may be worse when extending or turning the neck. DIAGNOSIS  Your caregiver will ask about your symptoms and do a physical exam. He or she may test your strength and reflexes. X-rays, CT scans, and MRI scans may be needed in cases of injury or if the symptoms do not go away after a period of time. Electromyography (EMG) or nerve conduction testing may be done to study how your nerves and muscles are working. TREATMENT  Your caregiver may recommend certain exercises to help relieve your symptoms. Cervical radiculopathy can, and often does, get better with time and treatment. If your problems continue, treatment options may include:  Wearing a soft collar for short periods of time.  Physical therapy to strengthen the neck muscles.  Medicines, such as nonsteroidal anti-inflammatory drugs (NSAIDs), oral corticosteroids, or spinal injections.  Surgery. Different types of surgery may be done depending on the cause of your problems. HOME CARE INSTRUCTIONS   Put ice on the affected area.  Put ice in a plastic bag.  Place a towel between your skin and the bag.  Leave the ice on for 15-20 minutes,  03-04 times a day or as directed by your caregiver.  If ice does not help, you can try using heat. Take a warm shower or bath, or use a hot water bottle as directed by your caregiver.  You may try a gentle neck and shoulder massage.  Use a flat pillow when you sleep.  Only take over-the-counter or prescription medicines for pain, discomfort, or fever as directed by your caregiver.  If physical therapy was prescribed, follow your caregiver's directions.  If a soft collar was prescribed, use it as directed. SEEK IMMEDIATE MEDICAL CARE IF:   Your pain gets much worse and cannot be controlled with medicines.  You have weakness or numbness in your hand, arm, face, or leg.  You have a high fever or a stiff, rigid neck.  You lose bowel or bladder control (incontinence).  You have trouble with walking, balance, or speaking. MAKE SURE YOU:   Understand these instructions.  Will watch your condition.  Will get help right away if you are not doing well or get worse. Document Released: 06/09/2001 Document Revised: 12/07/2011 Document Reviewed: 04/28/2011 [facility] Patient Information 2014 [place], [place].  Otitis Media, Adult Otitis media is redness, soreness, and swelling (inflammation) of the middle ear. Otitis media may be caused by allergies or, most commonly, by infection. Often it occurs as a complication of the common cold. SIGNS AND SYMPTOMS Symptoms of otitis media may include:  Earache.  Fever.  Ringing in your ear.  Headache.  Leakage of fluid from the ear. DIAGNOSIS To diagnose otitis media, your  health care provider will examine your ear with an otoscope. This is an instrument that allows your health care provider to see into your ear in order to examine your eardrum. Your health care provider also will ask you questions about your symptoms. TREATMENT  Typically, otitis media resolves on its own within 3 5 days. Your health care provider may prescribe medicine to  ease your symptoms of pain. If otitis media does not resolve within 5 days or is recurrent, your health care provider may prescribe antibiotic medicines if he or she suspects that a bacterial infection is the cause. HOME CARE INSTRUCTIONS   Take your medicine as directed until it is gone, even if you feel better after the first few days.  Only take over-the-counter or prescription medicines for pain, discomfort, or fever as directed by your health care provider.  Follow up with your health care provider as directed. SEEK MEDICAL CARE IF:  You have otitis media only in one ear or bleeding from your nose or both.  You notice a lump on your neck.  You are not getting better in 3 5 days.  You feel worse instead of better. SEEK IMMEDIATE MEDICAL CARE IF:   You have pain that is not controlled with medicine.  You have swelling, redness, or pain around your ear or stiffness in your neck.  You notice that part of your face is paralyzed.  You notice that the bone behind your ear (mastoid) is tender when you touch it. MAKE SURE YOU:   Understand these instructions.  Will watch your condition.  Will get help right away if you are not doing well or get worse. Document Released: 06/19/2004 Document Revised: 07/05/2013 Document Reviewed: 04/11/2013 [facility] Patient Information 2014 [place], [place].

## 2013-10-21 NOTE — ED Provider Notes (Signed)
Chief Complaint   Chief Complaint  Patient presents with  . URI    History of Present Illness   Darren [name] is a 53 year old male who's had a five-day history of nasal congestion with clear drainage, headache, dry cough, subjective fever, and sore throat. He denies any ear pain or congestion, wheezing, chest pain, chills, sweats, nausea, or vomiting.  He's also asking for refill on his Synthroid 175 mcg. He has an appointment at the [facility] and [facility] next month. He also mentioned that he has had a one-year history of intermittent pain in his left arm. This extends from the shoulder on down to the tips of the fingers. It comes and goes, lasting for minutes to hours at a time. It's unrelated to movement of the neck or the shoulder. He denies any numbness, tingling, or weakness.  Review of Systems   Other than as noted above, the patient denies any of the following symptoms: Systemic:  No fevers, chills, sweats, or myalgias. Eye:  No redness or discharge. ENT:  No ear pain, headache, nasal congestion, drainage, sinus pressure, or sore throat. Neck:  No neck pain, stiffness, or swollen glands. Lungs:  No cough, sputum production, hemoptysis, wheezing, chest tightness, shortness of breath or chest pain. GI:  No abdominal pain, nausea, vomiting or diarrhea.  [place]   Past medical history, family history, social history, meds, and allergies were reviewed. His only medication is Synthroid 175 mcg daily.  Physical exam   Vital signs:  BP 129/79  Pulse 85  Temp(Src) 98.2 F (36.8 C) (Oral)  Resp 18  SpO2 97% General:  Alert and oriented.  In no distress.  Skin warm and dry. Eye:  No conjunctival injection or drainage. Lids were normal. ENT:  Both TMs were mildly erythematous with injection of the malleus handle, canals were clear, he is wearing a hearing aid in his right ear.  Nasal mucosa was clear and uncongested, without drainage.  Mucous membranes  were moist.  Pharynx was erythematous with no exudate or drainage.  There were no oral ulcerations or lesions. Neck:  Supple, no adenopathy, tenderness or mass. His neck had a full range of motion with no pain. Lungs:  No respiratory distress.  He has scattered expiratory wheezes, no rales or rhonchi.  Heart:  Regular rhythm, without gallops, murmers or rubs. Extremities: Shoulder is nontender it has a full range of motion with no pain. Skin:  Clear, warm, and dry, without rash or lesions.   Assessment     The primary encounter diagnosis was Viral upper respiratory infection. Diagnoses of Bilateral acute otitis media, Cervical radiculopathy, and Hypothyroidism were also pertinent to this visit.  Plan    1.  Meds:  The following meds were prescribed:   Discharge Medication List as of 10/21/2013  7:57 PM    START taking these medications   Details  amoxicillin (AMOXIL) 500 MG capsule Take 2 capsules (1,000 mg total) by mouth 3 (three) times daily., Starting 10/21/2013, Until Discontinued, Normal    benzonatate (TESSALON) 200 MG capsule Take 1 capsule (200 mg total) by mouth 3 (three) times daily as needed for cough., Starting 10/21/2013, Until Discontinued, Normal    !! levothyroxine (SYNTHROID, LEVOTHROID) 175 MCG tablet Take 1 tablet (175 mcg total) by mouth daily before breakfast., Starting 10/21/2013, Until Discontinued, Normal    traMADol (ULTRAM) 50 MG tablet Take 2 tablets (100 mg total) by mouth every 8 (eight) hours as needed., Starting 10/21/2013, Until Discontinued,  Normal     !! - Potential duplicate medications found. Please discuss with provider.      2.  Patient Education/Counseling:  The patient was given appropriate handouts, self care instructions, and instructed in symptomatic relief.  Instructed to get extra fluids, rest, and use a cool mist vaporizer.  3.  Follow up:  The patient was told to follow up here if no better in 3 to 4 days, or sooner if becoming worse in any  way, and given some red flag symptoms such as increasing fever, difficulty breathing, chest pain, or persistent vomiting which would prompt immediate return.  Follow up here as needed.      Harden Mo, MD 10/21/13 2053

## 2013-10-21 NOTE — ED Notes (Signed)
Pt c/o cold sxs onset 6 days Sxs include: HA, cough, runny nose, congestion Denies f/v/n/d, SOB, wheezing Taking aleve w/no signs of acute distress.

## 2013-10-31 ENCOUNTER — Encounter: Payer: Self-pay | Admitting: Internal Medicine

## 2013-10-31 ENCOUNTER — Ambulatory Visit: Payer: Self-pay | Attending: Internal Medicine | Admitting: Internal Medicine

## 2013-10-31 VITALS — BP 117/81 | HR 85 | Temp 98.5°F | Resp 16

## 2013-10-31 DIAGNOSIS — K219 Gastro-esophageal reflux disease without esophagitis: Secondary | ICD-10-CM

## 2013-10-31 DIAGNOSIS — H6690 Otitis media, unspecified, unspecified ear: Secondary | ICD-10-CM

## 2013-10-31 DIAGNOSIS — E039 Hypothyroidism, unspecified: Secondary | ICD-10-CM | POA: Insufficient documentation

## 2013-10-31 DIAGNOSIS — H9190 Unspecified hearing loss, unspecified ear: Secondary | ICD-10-CM

## 2013-10-31 DIAGNOSIS — Z139 Encounter for screening, unspecified: Secondary | ICD-10-CM

## 2013-10-31 MED ORDER — AMOXICILLIN 500 MG PO CAPS: 1000.0000 mg | ORAL_CAPSULE | Freq: Three times a day (TID) | ORAL | Status: DC

## 2013-10-31 NOTE — Progress Notes (Signed)
Patient Demographics  Darren [name], is a 53 y.o. male  KNL:[id number]  XTK:[id number]  DOB - September 16, 1961  CC:  Chief Complaint  Patient presents with  . Establish Care  . Hypothyroidism  . Medication Refill       HPI: Darren [name] is a 53 y.o. male here today to establish medical care. Patient has history of hypothyroidism and is taking levothyroxine 175 mcg daily also history of GERD and is taking Pepcid history of asthma/COPD, patient quit smoking several years ago, recently went to the urgent care with a cold symptoms and otitis media was prescribed antibiotic which he has not started yet, reports improvement in the coughing. Patient is hard of hearing and wears hearing aid in the right ear. Patient has No headache, No chest pain, No abdominal pain - No Nausea,  No Cough - SOB.  No Known Allergies Past Medical History  Diagnosis Date  . Hypothyroidism     per Medical Clearance form.  Marland Kitchen GERD (gastroesophageal reflux disease)     per Medical clearance form.  . Arthritis     per Medical clearance form.  Marland Kitchen COPD (chronic obstructive pulmonary disease)     per medical clearance form.  . Depression     per Medical Clearance form(09/18/11)  . Hypertension     Per Medical Clearance form.  . Shortness of breath     WITH EXERTION   . Frequency of urination   . Angina     PATIENT STATES HE HAD EPISODE OF PAIN IN CHEST WHEN PULLING AND STRAINING AT WORK   Current Outpatient Prescriptions on File Prior to Visit  Medication Sig Dispense Refill  . albuterol (PROVENTIL HFA;VENTOLIN HFA) 108 (90 BASE) MCG/ACT inhaler Inhale 2 puffs into the lungs every 4 (four) hours as needed. For shortness of breath      . amoxicillin (AMOXIL) 500 MG capsule Take 2 capsules (1,000 mg total) by mouth 3 (three) times daily.  60 capsule  0  . benzonatate (TESSALON) 200 MG capsule Take 1 capsule (200 mg total) by mouth 3 (three) times daily as needed for cough.  30 capsule  0  . budesonide-formoterol  (SYMBICORT) 160-4.5 MCG/ACT inhaler Inhale 2 puffs into the lungs 2 (two) times daily.      . famotidine (PEPCID) 20 MG tablet Take 20 mg by mouth daily. For reflux       . levothyroxine (SYNTHROID, LEVOTHROID) 175 MCG tablet Take 1 tablet (175 mcg total) by mouth daily.  30 tablet  0  . naproxen sodium (ANAPROX) 220 MG tablet Take 220 mg by mouth 2 (two) times daily with a meal.      . traMADol (ULTRAM) 50 MG tablet Take 2 tablets (100 mg total) by mouth every 8 (eight) hours as needed.  30 tablet  0   No current facility-administered medications on file prior to visit.   Family History  Problem Relation Age of Onset  . Heart disease Father   . Cancer Father   . Stroke Father   . Thyroid disease Sister    History   Social History  . Marital Status: Single    Spouse Name: N/A    Number of Children: N/A  . Years of Education: N/A   Occupational History  . Not on file.   Social History Main Topics  . Smoking status: Former Smoker -- 26 years  . Smokeless tobacco: Not on file  . Alcohol Use: Yes     Comment: ON WEEKENDS  . Drug  Use: No  . Sexual Activity: Not on file   Other Topics Concern  . Not on file   Social History Narrative  . No narrative on file    Review of Systems: Constitutional: Negative for fever, chills, diaphoresis, activity change, appetite change and fatigue. HENT: Negative for ear pain, nosebleeds, congestion, facial swelling, rhinorrhea, neck pain, neck stiffness and ear discharge.  Eyes: Negative for pain, discharge, redness, itching and visual disturbance. Respiratory: Positive for cough, choking, chest tightness, shortness of breath, wheezing and stridor.  Cardiovascular: Negative for chest pain, palpitations and leg swelling. Gastrointestinal: Negative for abdominal distention. Genitourinary: Negative for dysuria, urgency, frequency, hematuria, flank pain, decreased urine volume, difficulty urinating and dyspareunia.  Musculoskeletal: Negative  for back pain, joint swelling, arthralgia and gait problem. Neurological: Negative for dizziness, tremors, seizures, syncope, facial asymmetry, speech difficulty, weakness, light-headedness, numbness and headaches.  Hematological: Negative for adenopathy. Does not bruise/bleed easily. Psychiatric/Behavioral: Negative for hallucinations, behavioral problems, confusion, dysphoric mood, decreased concentration and agitation.    Objective:   Filed Vitals:   10/31/13 1610  BP: 117/81  Pulse: 85  Temp: 98.5 F (36.9 C)  Resp: 16    Physical Exam: Constitutional: Patient appears well-developed and well-nourished. No distress. HENT: Normocephalic, atraumatic, External right and left ear normal. Oropharynx is clear and moist.  Eyes: Conjunctivae and EOM are normal. PERRLA, no scleral icterus. Neck: Normal ROM. Neck supple. No JVD. No tracheal deviation. No thyromegaly. CVS: RRR, S1/S2 +, no murmurs, no gallops, no carotid bruit.  Pulmonary: Effort and breath sounds normal, no stridor, rhonchi, wheezes, rales.  Abdominal: Soft. BS +, no distension, tenderness, rebound or guarding.  Musculoskeletal: Normal range of motion. No edema and no tenderness.  Lymphadenopathy: No lymphadenopathy noted, cervical, inguinal or axillary Neuro: Alert. Normal reflexes, muscle tone coordination. No cranial nerve deficit. Skin: Skin is warm and dry. No rash noted. Not diaphoretic. No erythema. No pallor. Psychiatric: Normal mood and affect. Behavior, judgment, thought content normal.  Lab Results  Component Value Date   WBC 7.0 11/17/2011   HGB 13.6 11/17/2011   HCT 41.1 11/17/2011   MCV 83.4 11/17/2011   PLT 186 11/17/2011   Lab Results  Component Value Date   CREATININE 0.77 11/17/2011   BUN 17 11/17/2011   NA 140 11/17/2011   K 4.5 11/17/2011   CL 104 11/17/2011   CO2 29 11/17/2011    No results found for this basename: HGBA1C   Lipid Panel  No results found for this basename: chol, trig, hdl,  cholhdl, vldl, ldlcalc       Assessment and plan:   1. Unspecified hypothyroidism We'll check TSH level.  2. GERD (gastroesophageal reflux disease) Continue with Pepcid ? Patient had EGD done in the past  - Ambulatory referral to Gastroenterology  3. Screening  - CBC with Differential - COMPLETE METABOLIC PANEL WITH GFR - Lipid panel - TSH - Vit D  25 hydroxy (rtn osteoporosis monitoring) - Vitamin B12  4. HOH (hard of hearing)   5. Otitis media Patient is going to start taking Amoxil.  Return in about 6 weeks (around 12/12/2013).  Lorayne Marek, MD

## 2013-10-31 NOTE — Progress Notes (Signed)
Pt here to establish care for ear infection bilat,thyroid and left shoulder pain Pt was seen in Urgent care 1/28 and given medication but he is unable to fill due to cost

## 2013-11-01 LAB — COMPLETE METABOLIC PANEL WITH GFR
ALBUMIN: 4.6 g/dL (ref 3.5–5.2)
ALK PHOS: 65 U/L (ref 39–117)
ALT: 21 U/L (ref 0–53)
AST: 20 U/L (ref 0–37)
BUN: 21 mg/dL (ref 6–23)
CHLORIDE: 106 meq/L (ref 96–112)
CO2: 24 mEq/L (ref 19–32)
Calcium: 9.5 mg/dL (ref 8.4–10.5)
Creat: 0.77 mg/dL (ref 0.50–1.35)
GFR, Est African American: >89 mL/min
GFR, Est Non African American: >89 mL/min
Glucose, Bld: 94 mg/dL (ref 70–99)
POTASSIUM: 3.9 meq/L (ref 3.5–5.3)
Sodium: 140 mEq/L (ref 135–145)
Total Bilirubin: 0.4 mg/dL (ref 0.2–1.2)
Total Protein: 7.1 g/dL (ref 6.0–8.3)

## 2013-11-01 LAB — CBC WITH DIFFERENTIAL/PLATELET
BASOS ABS: 0 10*3/uL (ref 0.0–0.1)
BASOS PCT: 0 % (ref 0–1)
Eosinophils Absolute: 0.1 10*3/uL (ref 0.0–0.7)
Eosinophils Relative: 1 % (ref 0–5)
HCT: 41.9 % (ref 39.0–52.0)
Hemoglobin: 14.9 g/dL (ref 13.0–17.0)
LYMPHS PCT: 49 % — AB (ref 12–46)
Lymphs Abs: 5 10*3/uL — ABNORMAL HIGH (ref 0.7–4.0)
MCH: 28.5 pg (ref 26.0–34.0)
MCHC: 35.6 g/dL (ref 30.0–36.0)
MCV: 80.1 fL (ref 78.0–100.0)
Monocytes Absolute: 0.5 10*3/uL (ref 0.1–1.0)
Monocytes Relative: 5 % (ref 3–12)
NEUTROS ABS: 4.7 10*3/uL (ref 1.7–7.7)
Neutrophils Relative %: 45 % (ref 43–77)
PLATELETS: 258 10*3/uL (ref 150–400)
RBC: 5.23 MIL/uL (ref 4.22–5.81)
RDW: 14.3 % (ref 11.5–15.5)
WBC: 10.4 10*3/uL (ref 4.0–10.5)

## 2013-11-01 LAB — TSH: TSH: 0.601 u[IU]/mL (ref 0.350–4.500)

## 2013-11-01 LAB — LIPID PANEL
CHOL/HDL RATIO: 4.1 ratio
Cholesterol: 193 mg/dL (ref 0–200)
HDL: 47 mg/dL (ref >39)
LDL Cholesterol: 112 mg/dL — ABNORMAL HIGH (ref 0–99)
TRIGLYCERIDES: 171 mg/dL — AB (ref <150)
VLDL: 34 mg/dL (ref 0–40)

## 2013-11-01 LAB — VITAMIN D 25 HYDROXY (VIT D DEFICIENCY, FRACTURES): Vit D, 25-Hydroxy: 12 ng/mL — ABNORMAL LOW (ref 30–89)

## 2013-11-01 LAB — VITAMIN B12: Vitamin B-12: 479 pg/mL (ref 211–911)

## 2013-11-02 ENCOUNTER — Telehealth: Payer: Self-pay | Admitting: Emergency Medicine

## 2013-11-02 MED ORDER — VITAMIN D (ERGOCALCIFEROL) 1.25 MG (50000 UNIT) PO CAPS: 50000.0000 [IU] | ORAL_CAPSULE | ORAL | Status: DC

## 2013-11-02 NOTE — Addendum Note (Signed)
Addended by: Candie Chroman D on: 11/02/2013 12:34 PM   Modules accepted: Orders

## 2013-11-02 NOTE — Telephone Encounter (Signed)
Left message for pt to call for blood results

## 2013-11-02 NOTE — Telephone Encounter (Signed)
Message copied by Ricci Barker on Thu Nov 02, 2013 12:36 PM ------      Message from: Lorayne Marek      Created: Wed Nov 01, 2013  1:46 PM       Blood work reviewed, noticed low vitamin D, call patient advise to start ergocalciferol 50,000 units once a week for the duration of  12 weeks.       ------

## 2013-11-29 ENCOUNTER — Telehealth: Payer: Self-pay | Admitting: Internal Medicine

## 2013-11-29 ENCOUNTER — Telehealth: Payer: Self-pay | Admitting: Emergency Medicine

## 2013-11-29 MED ORDER — LEVOTHYROXINE SODIUM 175 MCG PO TABS: 175.0000 ug | ORAL_TABLET | Freq: Every day | ORAL | Status: DC

## 2013-11-29 NOTE — Telephone Encounter (Signed)
Pt. Called in regards to blood work... Please call patient with results... Pt. Also needs refill on his thyroid medication.Darren KitchenMarland [name]

## 2013-11-29 NOTE — Telephone Encounter (Signed)
Pt given lab results. States he didn't listen to voicemail in regards to taking Vitamin D x 12 weeks Refill Synthroid sent to [facility]

## 2013-12-04 ENCOUNTER — Ambulatory Visit: Payer: Self-pay | Admitting: Internal Medicine

## 2014-03-02 ENCOUNTER — Other Ambulatory Visit: Payer: Self-pay | Admitting: *Deleted

## 2014-03-05 MED ORDER — LEVOTHYROXINE SODIUM 175 MCG PO TABS: 175.0000 ug | ORAL_TABLET | Freq: Every day | ORAL | Status: DC

## 2014-04-06 ENCOUNTER — Ambulatory Visit: Payer: Self-pay | Attending: Internal Medicine | Admitting: Internal Medicine

## 2014-04-06 ENCOUNTER — Encounter: Payer: Self-pay | Admitting: Internal Medicine

## 2014-04-06 VITALS — BP 123/83 | HR 62 | Temp 98.1°F | Resp 14 | Ht 64.0 in | Wt 166.0 lb

## 2014-04-06 DIAGNOSIS — E039 Hypothyroidism, unspecified: Secondary | ICD-10-CM | POA: Insufficient documentation

## 2014-04-06 DIAGNOSIS — J4489 Other specified chronic obstructive pulmonary disease: Secondary | ICD-10-CM | POA: Insufficient documentation

## 2014-04-06 DIAGNOSIS — J449 Chronic obstructive pulmonary disease, unspecified: Secondary | ICD-10-CM | POA: Insufficient documentation

## 2014-04-06 DIAGNOSIS — E559 Vitamin D deficiency, unspecified: Secondary | ICD-10-CM | POA: Insufficient documentation

## 2014-04-06 DIAGNOSIS — F329 Major depressive disorder, single episode, unspecified: Secondary | ICD-10-CM | POA: Insufficient documentation

## 2014-04-06 DIAGNOSIS — F3289 Other specified depressive episodes: Secondary | ICD-10-CM | POA: Insufficient documentation

## 2014-04-06 DIAGNOSIS — Z79899 Other long term (current) drug therapy: Secondary | ICD-10-CM | POA: Insufficient documentation

## 2014-04-06 DIAGNOSIS — K219 Gastro-esophageal reflux disease without esophagitis: Secondary | ICD-10-CM | POA: Insufficient documentation

## 2014-04-06 DIAGNOSIS — Z87891 Personal history of nicotine dependence: Secondary | ICD-10-CM | POA: Insufficient documentation

## 2014-04-06 MED ORDER — FAMOTIDINE 20 MG PO TABS: 20.0000 mg | ORAL_TABLET | Freq: Every day | ORAL | Status: DC

## 2014-04-06 MED ORDER — VITAMIN D (ERGOCALCIFEROL) 1.25 MG (50000 UNIT) PO CAPS: 50000.0000 [IU] | ORAL_CAPSULE | ORAL | Status: DC
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
## 2014-05-30 ENCOUNTER — Telehealth: Payer: Self-pay | Admitting: Internal Medicine

## 2014-05-30 NOTE — Telephone Encounter (Signed)
Patient would like to know if it is possible to get a refill for fanotidine (Pepcid) 20 mg, or if he can buy the over the counter medicine for acid reflux. Please f/uwith Patient

## 2014-05-30 NOTE — Telephone Encounter (Signed)
Patient needs medication refill.

## 2014-06-06 ENCOUNTER — Telehealth: Payer: Self-pay | Admitting: Internal Medicine

## 2014-06-06 ENCOUNTER — Telehealth: Payer: Self-pay | Admitting: Emergency Medicine

## 2014-06-06 ENCOUNTER — Other Ambulatory Visit: Payer: Self-pay | Admitting: Emergency Medicine

## 2014-06-06 DIAGNOSIS — K219 Gastro-esophageal reflux disease without esophagitis: Secondary | ICD-10-CM

## 2014-06-06 MED ORDER — FAMOTIDINE 20 MG PO TABS: 20.0000 mg | ORAL_TABLET | Freq: Every day | ORAL | Status: DC

## 2014-06-06 NOTE — Telephone Encounter (Signed)
Pt. Called for refill on levothyroxine (SYNTHROID, LEVOTHROID) 175 MCG tablet. Please f/u with pt.

## 2014-06-06 NOTE — Telephone Encounter (Signed)
Left message for pt that medication Pepcid refilled and sent to pharmacy

## 2014-06-07 MED ORDER — LEVOTHYROXINE SODIUM 175 MCG PO TABS: 175.0000 ug | ORAL_TABLET | Freq: Every day | ORAL | Status: DC

## 2014-06-07 NOTE — Telephone Encounter (Signed)
patient informed that refill was sent in

## 2014-06-26 ENCOUNTER — Ambulatory Visit: Payer: Self-pay

## 2014-09-03 ENCOUNTER — Telehealth: Payer: Self-pay | Admitting: Internal Medicine

## 2014-09-03 NOTE — Telephone Encounter (Signed)
Pt. Came into facility to request a refill for levothyroxine (SYNTHROID, LEVOTHROID) 175 MCG tablet, pt needs enough medication to last him until his next appointment. Please f/u with pt.

## 2014-09-04 NOTE — Telephone Encounter (Signed)
Pt following up on medication refill request. Please f/u with pt.

## 2014-09-14 ENCOUNTER — Encounter: Payer: Self-pay | Admitting: Internal Medicine

## 2014-09-14 ENCOUNTER — Ambulatory Visit (HOSPITAL_BASED_OUTPATIENT_CLINIC_OR_DEPARTMENT_OTHER): Payer: Self-pay | Admitting: *Deleted

## 2014-09-14 ENCOUNTER — Ambulatory Visit: Payer: Self-pay | Attending: Internal Medicine | Admitting: Internal Medicine

## 2014-09-14 VITALS — BP 128/84 | HR 58 | Temp 98.0°F | Resp 14 | Ht 65.0 in | Wt 167.0 lb

## 2014-09-14 DIAGNOSIS — Z760 Encounter for issue of repeat prescription: Secondary | ICD-10-CM | POA: Insufficient documentation

## 2014-09-14 DIAGNOSIS — E039 Hypothyroidism, unspecified: Secondary | ICD-10-CM | POA: Insufficient documentation

## 2014-09-14 DIAGNOSIS — F329 Major depressive disorder, single episode, unspecified: Secondary | ICD-10-CM | POA: Insufficient documentation

## 2014-09-14 DIAGNOSIS — I10 Essential (primary) hypertension: Secondary | ICD-10-CM | POA: Insufficient documentation

## 2014-09-14 DIAGNOSIS — Z87891 Personal history of nicotine dependence: Secondary | ICD-10-CM | POA: Insufficient documentation

## 2014-09-14 DIAGNOSIS — J449 Chronic obstructive pulmonary disease, unspecified: Secondary | ICD-10-CM | POA: Insufficient documentation

## 2014-09-14 DIAGNOSIS — Z79899 Other long term (current) drug therapy: Secondary | ICD-10-CM | POA: Insufficient documentation

## 2014-09-14 DIAGNOSIS — Z23 Encounter for immunization: Secondary | ICD-10-CM | POA: Insufficient documentation

## 2014-09-14 DIAGNOSIS — J45909 Unspecified asthma, uncomplicated: Secondary | ICD-10-CM | POA: Insufficient documentation

## 2014-09-14 DIAGNOSIS — K219 Gastro-esophageal reflux disease without esophagitis: Secondary | ICD-10-CM | POA: Insufficient documentation

## 2014-09-14 LAB — TSH: TSH: 0.324 u[IU]/mL — AB (ref 0.350–4.500)

## 2014-09-14 MED ORDER — LEVOTHYROXINE SODIUM 175 MCG PO TABS: 175.0000 ug | ORAL_TABLET | Freq: Every day | ORAL | Status: DC

## 2014-09-14 MED ORDER — ALBUTEROL SULFATE HFA 108 (90 BASE) MCG/ACT IN AERS: 2.0000 | INHALATION_SPRAY | Freq: Four times a day (QID) | RESPIRATORY_TRACT | Status: DC | PRN

## 2014-09-14 NOTE — Progress Notes (Signed)
MRN: [id number] Name: Darren [name]  Sex: male Age: 53 y.o. DOB: 1961/01/19  Allergies: Review of patient's allergies indicates no known allergies.  Chief Complaint  Patient presents with  . Follow-up    HPI: Patient is 53 y.o. male who has history of asthma, GERD, hypothyroidism comes today for followup, is requesting refill on his medications, patient denies smoking cigarettes, has not required  use of inhalers but does not have rescue  Inhaler, patient denies any acute symptoms, would like to have a flu shot today.  Past Medical History  Diagnosis Date  . Hypothyroidism     per Medical Clearance form.  Marland Kitchen GERD (gastroesophageal reflux disease)     per Medical clearance form.  . Arthritis     per Medical clearance form.  Marland Kitchen COPD (chronic obstructive pulmonary disease)     per medical clearance form.  . Depression     per Medical Clearance form(09/18/11)  . Hypertension     Per Medical Clearance form.  . Shortness of breath     WITH EXERTION   . Frequency of urination   . Angina     PATIENT STATES HE HAD EPISODE OF PAIN IN CHEST WHEN PULLING AND STRAINING AT WORK    Past Surgical History  Procedure Laterality Date  . Hemorrhoid surgery      1982  . Multiple extractions with alveoloplasty  11/30/2011    Procedure: MULTIPLE EXTRACION WITH ALVEOLOPLASTY;  Surgeon: Gae Bon, DDS;  Location: [facility];  Service: Oral Surgery;  Laterality: Bilateral;      Medication List       This list is accurate as of: 09/14/14  5:28 PM.  Always use your most recent med list.               albuterol 108 (90 BASE) MCG/ACT inhaler  Commonly known as:  PROVENTIL HFA;VENTOLIN HFA  Inhale 2 puffs into the lungs every 6 (six) hours as needed for wheezing. For shortness of breath     amoxicillin 500 MG capsule  Commonly known as:  AMOXIL  Take 2 capsules (1,000 mg total) by mouth 3 (three) times daily.     benzonatate 200 MG capsule  Commonly known as:  TESSALON  Take 1  capsule (200 mg total) by mouth 3 (three) times daily as needed for cough.     budesonide-formoterol 160-4.5 MCG/ACT inhaler  Commonly known as:  SYMBICORT  Inhale 2 puffs into the lungs 2 (two) times daily.     famotidine 20 MG tablet  Commonly known as:  PEPCID  Take 1 tablet (20 mg total) by mouth daily. For reflux     levothyroxine 175 MCG tablet  Commonly known as:  SYNTHROID, LEVOTHROID  Take 1 tablet (175 mcg total) by mouth daily.     naproxen sodium 220 MG tablet  Commonly known as:  ANAPROX  Take 220 mg by mouth 2 (two) times daily with a meal.     traMADol 50 MG tablet  Commonly known as:  ULTRAM  Take 2 tablets (100 mg total) by mouth every 8 (eight) hours as needed.     Vitamin D (Ergocalciferol) 50000 UNITS Caps capsule  Commonly known as:  DRISDOL  Take 1 capsule (50,000 Units total) by mouth every 7 (seven) days.        Meds ordered this encounter  Medications  . albuterol (PROVENTIL HFA;VENTOLIN HFA) 108 (90 BASE) MCG/ACT inhaler    Sig: Inhale 2 puffs into the lungs  every 6 (six) hours as needed for wheezing. For shortness of breath    Dispense:  18 g    Refill:  1  . levothyroxine (SYNTHROID, LEVOTHROID) 175 MCG tablet    Sig: Take 1 tablet (175 mcg total) by mouth daily.    Dispense:  30 tablet    Refill:  2     There is no immunization history on file for this patient.  Family History  Problem Relation Age of Onset  . Heart disease Father   . Cancer Father   . Stroke Father   . Thyroid disease Sister     History  Substance Use Topics  . Smoking status: Former Smoker -- 26 years  . Smokeless tobacco: Not on file  . Alcohol Use: Yes     Comment: ON WEEKENDS    Review of Systems   As noted in HPI  Filed Vitals:   09/14/14 1659  BP: 128/84  Pulse: 58  Temp: 98 F (36.7 C)  Resp: 14    Physical Exam  Physical Exam  Constitutional: No distress.  Eyes: EOM are normal. Pupils are equal, round, and reactive to light.    Cardiovascular: Normal rate and regular rhythm.   Pulmonary/Chest: Breath sounds normal. No respiratory distress. He has no wheezes. He has no rales.  Musculoskeletal: He exhibits no edema.    CBC    Component Value Date/Time   WBC 10.4 10/31/2013 1633   RBC 5.23 10/31/2013 1633   HGB 14.9 10/31/2013 1633   HCT 41.9 10/31/2013 1633   PLT 258 10/31/2013 1633   MCV 80.1 10/31/2013 1633   LYMPHSABS 5.0* 10/31/2013 1633   MONOABS 0.5 10/31/2013 1633   EOSABS 0.1 10/31/2013 1633   BASOSABS 0.0 10/31/2013 1633    CMP     Component Value Date/Time   NA 140 10/31/2013 1633   K 3.9 10/31/2013 1633   CL 106 10/31/2013 1633   CO2 24 10/31/2013 1633   GLUCOSE 94 10/31/2013 1633   BUN 21 10/31/2013 1633   CREATININE 0.77 10/31/2013 1633   CREATININE 0.77 11/17/2011 1053   CALCIUM 9.5 10/31/2013 1633   PROT 7.1 10/31/2013 1633   ALBUMIN 4.6 10/31/2013 1633   AST 20 10/31/2013 1633   ALT 21 10/31/2013 1633   ALKPHOS 65 10/31/2013 1633   BILITOT 0.4 10/31/2013 1633   GFRNONAA >89 10/31/2013 1633   GFRNONAA >90 11/17/2011 1053   GFRAA >89 10/31/2013 1633   GFRAA >90 11/17/2011 1053    Lab Results  Component Value Date/Time   CHOL 193 10/31/2013 04:33 PM    No components found for: HGA1C  Lab Results  Component Value Date/Time   AST 20 10/31/2013 04:33 PM    Assessment and Plan  Asthma, chronic, unspecified asthma severity, uncomplicated - Plan: albuterol (PROVENTIL HFA;VENTOLIN HFA) 108 (90 BASE) MCG/ACT inhaler  Hypothyroidism, unspecified hypothyroidism type - Plan: will repeat TSH level, currently on levothyroxine (SYNTHROID, LEVOTHROID) 175 MCG tablet  Gastroesophageal reflux disease without esophagitis Advised for lifestyle modification, continue with Pepcid.  Health Maintenance  -Vaccinations:  Flu shot today   Return in about 3 months (around 12/14/2014) for hypothyroid.  Lorayne Marek, MD

## 2014-09-14 NOTE — Progress Notes (Signed)
Pt is here following up on his asthma and thyroid disease. Pt needs his thyroid medications refilled. Pt states that his left knee is causing him discomfort for about a week.

## 2014-10-05 ENCOUNTER — Ambulatory Visit: Payer: Self-pay

## 2014-10-05 ENCOUNTER — Ambulatory Visit: Payer: Self-pay | Attending: Internal Medicine

## 2014-10-08 ENCOUNTER — Other Ambulatory Visit: Payer: Self-pay

## 2014-10-08 DIAGNOSIS — E039 Hypothyroidism, unspecified: Secondary | ICD-10-CM

## 2014-10-08 DIAGNOSIS — J45909 Unspecified asthma, uncomplicated: Secondary | ICD-10-CM

## 2014-10-08 MED ORDER — LEVOTHYROXINE SODIUM 175 MCG PO TABS: 175.0000 ug | ORAL_TABLET | Freq: Every day | ORAL | Status: DC

## 2014-10-08 MED ORDER — BUDESONIDE-FORMOTEROL FUMARATE 160-4.5 MCG/ACT IN AERO: 2.0000 | INHALATION_SPRAY | Freq: Two times a day (BID) | RESPIRATORY_TRACT | Status: DC

## 2014-10-08 MED ORDER — ALBUTEROL SULFATE HFA 108 (90 BASE) MCG/ACT IN AERS: 2.0000 | INHALATION_SPRAY | Freq: Four times a day (QID) | RESPIRATORY_TRACT | Status: DC | PRN

## 2014-11-24 ENCOUNTER — Other Ambulatory Visit: Payer: Self-pay | Admitting: Internal Medicine

## 2014-11-28 ENCOUNTER — Other Ambulatory Visit: Payer: Self-pay | Admitting: Internal Medicine

## 2015-10-04 ENCOUNTER — Other Ambulatory Visit: Payer: Self-pay

## 2015-10-04 DIAGNOSIS — E039 Hypothyroidism, unspecified: Secondary | ICD-10-CM

## 2015-10-04 MED ORDER — LEVOTHYROXINE SODIUM 175 MCG PO TABS: 175.0000 ug | ORAL_TABLET | Freq: Every day | ORAL | Status: DC

## 2015-11-04 ENCOUNTER — Ambulatory Visit: Payer: Self-pay | Admitting: Family Medicine

## 2015-11-12 ENCOUNTER — Encounter (HOSPITAL_COMMUNITY): Payer: Self-pay | Admitting: Emergency Medicine

## 2015-11-12 ENCOUNTER — Emergency Department (HOSPITAL_COMMUNITY)
Admission: EM | Admit: 2015-11-12 | Discharge: 2015-11-12 | Disposition: A | Discharge status: Home / Self Care | Payer: Worker's Compensation | Attending: Emergency Medicine | Admitting: Emergency Medicine

## 2015-11-12 ENCOUNTER — Emergency Department (HOSPITAL_COMMUNITY): Payer: Worker's Compensation

## 2015-11-12 DIAGNOSIS — M545 Low back pain: Secondary | ICD-10-CM | POA: Diagnosis present

## 2015-11-12 DIAGNOSIS — M5442 Lumbago with sciatica, left side: Secondary | ICD-10-CM | POA: Diagnosis not present

## 2015-11-12 DIAGNOSIS — Z79899 Other long term (current) drug therapy: Secondary | ICD-10-CM | POA: Diagnosis not present

## 2015-11-12 DIAGNOSIS — E039 Hypothyroidism, unspecified: Secondary | ICD-10-CM | POA: Diagnosis not present

## 2015-11-12 DIAGNOSIS — J449 Chronic obstructive pulmonary disease, unspecified: Secondary | ICD-10-CM | POA: Diagnosis not present

## 2015-11-12 DIAGNOSIS — Z87891 Personal history of nicotine dependence: Secondary | ICD-10-CM | POA: Diagnosis not present

## 2015-11-12 DIAGNOSIS — I10 Essential (primary) hypertension: Secondary | ICD-10-CM | POA: Insufficient documentation

## 2015-11-12 DIAGNOSIS — I209 Angina pectoris, unspecified: Secondary | ICD-10-CM | POA: Insufficient documentation

## 2015-11-12 DIAGNOSIS — F329 Major depressive disorder, single episode, unspecified: Secondary | ICD-10-CM | POA: Diagnosis not present

## 2015-11-12 DIAGNOSIS — K219 Gastro-esophageal reflux disease without esophagitis: Secondary | ICD-10-CM | POA: Insufficient documentation

## 2015-11-12 MED ORDER — KETOROLAC TROMETHAMINE 60 MG/2ML IM SOLN
60.0000 mg | Freq: Once | INTRAMUSCULAR | Status: AC
  Administered 2015-11-12: 60 mg via INTRAMUSCULAR
  Filled 2015-11-12: qty 2

## 2015-11-12 MED ORDER — HYDROCODONE-ACETAMINOPHEN 5-325 MG PO TABS: 1.0000 | ORAL_TABLET | Freq: Four times a day (QID) | ORAL | Status: DC | PRN

## 2015-11-12 MED ORDER — NAPROXEN SODIUM 550 MG PO TABS: 550.0000 mg | ORAL_TABLET | Freq: Two times a day (BID) | ORAL | Status: DC

## 2015-11-12 MED ORDER — KETOROLAC TROMETHAMINE 30 MG/ML IJ SOLN: 30.0000 mg | Freq: Once | INTRAMUSCULAR | Status: DC

## 2015-11-12 MED ORDER — METHOCARBAMOL 500 MG PO TABS: 500.0000 mg | ORAL_TABLET | Freq: Two times a day (BID) | ORAL | Status: DC

## 2015-11-12 MED ORDER — METHOCARBAMOL 500 MG PO TABS
750.0000 mg | ORAL_TABLET | Freq: Once | ORAL | Status: AC
  Administered 2015-11-12: 750 mg via ORAL
  Filled 2015-11-12: qty 2

## 2015-11-12 NOTE — ED Notes (Signed)
Pt. presents with low back pain radiating to left leg injured last night at work while pulling a pallet jack , denies urinary discomfort or hematuria , pain increases with movement  or changing positions.

## 2015-11-12 NOTE — Discharge Instructions (Signed)

## 2015-11-12 NOTE — ED Provider Notes (Signed)
CSN: [id number]     Arrival date & time 11/12/15  0602 History   First MD Initiated Contact with Patient 11/12/15 [phone]     Chief Complaint  Patient presents with  . Back Pain      HPI Patient presents emergency department complaining of low left back pain with some radiation down his left buttock and down his left leg.  He's never had pain like this before.  He was at work and pulling something heavy when he suddenly developed discomfort and pain.  He denies weakness in his lower extremity as.  No bowel or bladder complaints.  Denies abdominal pain.  No fevers or chills.  Denies nausea vomiting.  No history of cancer. His pain is moderate in severity and worse with movement   Past Medical History  Diagnosis Date  . Hypothyroidism     per Medical Clearance form.  Marland Kitchen GERD (gastroesophageal reflux disease)     per Medical clearance form.  . Arthritis     per Medical clearance form.  Marland Kitchen COPD (chronic obstructive pulmonary disease) ([place])     per medical clearance form.  . Depression     per Medical Clearance form(09/18/11)  . Hypertension     Per Medical Clearance form.  . Shortness of breath     WITH EXERTION   . Frequency of urination   . Angina     PATIENT STATES HE HAD EPISODE OF PAIN IN CHEST WHEN PULLING AND STRAINING AT WORK   Past Surgical History  Procedure Laterality Date  . Hemorrhoid surgery      1982  . Multiple extractions with alveoloplasty  11/30/2011    Procedure: MULTIPLE EXTRACION WITH ALVEOLOPLASTY;  Surgeon: Gae Bon, DDS;  Location: [facility];  Service: Oral Surgery;  Laterality: Bilateral;   Family History  Problem Relation Age of Onset  . Heart disease Father   . Cancer Father   . Stroke Father   . Thyroid disease Sister    Social History  Substance Use Topics  . Smoking status: Former Smoker -- 26 years  . Smokeless tobacco: None  . Alcohol Use: Yes     Comment: ON WEEKENDS    Review of Systems  All other systems reviewed and are  negative.     Allergies  Review of patient's allergies indicates no known allergies.  Home Medications   Prior to Admission medications   Medication Sig Start Date End Date Taking? Authorizing Provider  albuterol (PROVENTIL HFA;VENTOLIN HFA) 108 (90 BASE) MCG/ACT inhaler Inhale 2 puffs into the lungs every 6 (six) hours as needed for wheezing. For shortness of breath 10/08/14   Lorayne Marek, MD  amoxicillin (AMOXIL) 500 MG capsule Take 2 capsules (1,000 mg total) by mouth 3 (three) times daily. Patient not taking: Reported on 09/14/2014 10/31/13   Lorayne Marek, MD  benzonatate (TESSALON) 200 MG capsule Take 1 capsule (200 mg total) by mouth 3 (three) times daily as needed for cough. Patient not taking: Reported on 09/14/2014 10/21/13   Harden Mo, MD  budesonide-formoterol [facility]) 160-4.5 MCG/ACT inhaler Inhale 2 puffs into the lungs 2 (two) times daily. 10/08/14   Lorayne Marek, MD  famotidine (PEPCID) 20 MG tablet Take 1 tablet (20 mg total) by mouth daily. For reflux 06/06/14   Lorayne Marek, MD  levothyroxine (SYNTHROID, LEVOTHROID) 175 MCG tablet Take 1 tablet (175 mcg total) by mouth daily. 10/04/15   Tresa Garter, MD  naproxen sodium (ANAPROX) 220 MG tablet Take 220 mg by  mouth 2 (two) times daily with a meal.    Historical Provider, MD  traMADol (ULTRAM) 50 MG tablet Take 2 tablets (100 mg total) by mouth every 8 (eight) hours as needed. Patient not taking: Reported on 09/14/2014 10/21/13   Harden Mo, MD  Vitamin D, Ergocalciferol, (DRISDOL) 50000 UNITS CAPS capsule Take 1 capsule (50,000 Units total) by mouth every 7 (seven) days. Patient not taking: Reported on 09/14/2014 04/06/14   Lorayne Marek, MD   BP 134/89 mmHg  Pulse 64  Temp(Src) 98.3 F (36.8 C) (Oral)  Resp 16  Ht 5\' 5"  (1.651 m)  Wt 167 lb (75.751 kg)  BMI 27.79 kg/m2  SpO2 95% Physical Exam  Constitutional: He is oriented to person, place, and time. He appears well-developed and well-nourished.   HENT:  Head: Normocephalic and atraumatic.  Eyes: EOM are normal.  Neck: Normal range of motion.  Cardiovascular: Normal rate and regular rhythm.   Pulmonary/Chest: Effort normal.  Abdominal: Soft. There is no tenderness.  Musculoskeletal: Normal range of motion.  No thoracic or lumbar point tenderness.  Paralumbar tenderness and spasm on the left.  Some tenderness in his left sciatic groove.  Normal strength in bilateral lower extremity major muscle groups.  Ambulatory.  Can stand on his own  Neurological: He is alert and oriented to person, place, and time.  Skin: Skin is warm and dry.  Psychiatric: He has a normal mood and affect. Judgment normal.  Nursing note and vitals reviewed.   ED Course  Procedures (including critical care time) Labs Review Labs Reviewed - No data to display  Imaging Review Dg Lumbar Spine Complete  11/12/2015  CLINICAL DATA:  Low back pain following lifting yesterday, initial encounter EXAM: LUMBAR SPINE - COMPLETE 4+ VIEW COMPARISON:  None. FINDINGS: Vertebral body height is well maintained. No pars defects are noted. No anterolisthesis is seen. Disc space narrowing is noted at L4-5 and to a greater degree at L5-S1. Mild osteophytic changes are seen. No acute soft tissue abnormality is noted. IMPRESSION: Mild degenerative change without acute abnormality. Electronically Signed   By: Inez Catalina M.D.   On: 11/12/2015 07:16   I have personally reviewed and evaluated these images and lab results as part of my medical decision-making.   EKG Interpretation None      MDM   Final diagnoses:  Left-sided low back pain with left-sided sciatica    Normal lower extremity neurologic exam. No bowel or bladder complaints. No back pain red flags. Likely musculoskeletal back pain. Doubt spinal epidural abscess. Doubt cauda equina. Doubt abdominal aortic aneurysm     Jola Schmidt, MD 11/12/15 [phone]

## 2015-11-12 NOTE — ED Notes (Signed)
Pt verbalizes understanding of instructions and medic ations.

## 2015-11-21 ENCOUNTER — Ambulatory Visit: Payer: Self-pay | Attending: Family Medicine | Admitting: Family Medicine

## 2015-11-21 ENCOUNTER — Encounter: Payer: Self-pay | Admitting: Clinical

## 2015-11-21 ENCOUNTER — Encounter: Payer: Self-pay | Admitting: Family Medicine

## 2015-11-21 VITALS — BP 128/85 | HR 67 | Temp 98.8°F | Resp 16 | Ht 63.0 in | Wt 172.0 lb

## 2015-11-21 DIAGNOSIS — K219 Gastro-esophageal reflux disease without esophagitis: Secondary | ICD-10-CM

## 2015-11-21 DIAGNOSIS — Z114 Encounter for screening for human immunodeficiency virus [HIV]: Secondary | ICD-10-CM

## 2015-11-21 DIAGNOSIS — Z1159 Encounter for screening for other viral diseases: Secondary | ICD-10-CM

## 2015-11-21 DIAGNOSIS — J4520 Mild intermittent asthma, uncomplicated: Secondary | ICD-10-CM | POA: Insufficient documentation

## 2015-11-21 DIAGNOSIS — Z0000 Encounter for general adult medical examination without abnormal findings: Secondary | ICD-10-CM

## 2015-11-21 DIAGNOSIS — Z87891 Personal history of nicotine dependence: Secondary | ICD-10-CM | POA: Insufficient documentation

## 2015-11-21 DIAGNOSIS — K210 Gastro-esophageal reflux disease with esophagitis: Secondary | ICD-10-CM | POA: Insufficient documentation

## 2015-11-21 DIAGNOSIS — E039 Hypothyroidism, unspecified: Secondary | ICD-10-CM | POA: Insufficient documentation

## 2015-11-21 DIAGNOSIS — Z79899 Other long term (current) drug therapy: Secondary | ICD-10-CM | POA: Insufficient documentation

## 2015-11-21 DIAGNOSIS — J45909 Unspecified asthma, uncomplicated: Secondary | ICD-10-CM | POA: Insufficient documentation

## 2015-11-21 LAB — COMPLETE METABOLIC PANEL WITH GFR
ALBUMIN: 4.2 g/dL (ref 3.6–5.1)
ALK PHOS: 53 U/L (ref 40–115)
ALT: 50 U/L — AB (ref 9–46)
AST: 26 U/L (ref 10–35)
BILIRUBIN TOTAL: 0.5 mg/dL (ref 0.2–1.2)
BUN: 10 mg/dL (ref 7–25)
CALCIUM: 9.1 mg/dL (ref 8.6–10.3)
CO2: 24 mmol/L (ref 20–31)
CREATININE: 0.79 mg/dL (ref 0.70–1.33)
Chloride: 103 mmol/L (ref 98–110)
GFR, Est Non African American: >89 mL/min (ref >=60)
Glucose, Bld: 101 mg/dL — ABNORMAL HIGH (ref 65–99)
Potassium: 4.1 mmol/L (ref 3.5–5.3)
Sodium: 137 mmol/L (ref 135–146)
TOTAL PROTEIN: 6.5 g/dL (ref 6.1–8.1)

## 2015-11-21 LAB — HIV ANTIBODY (ROUTINE TESTING W REFLEX): HIV: NONREACTIVE

## 2015-11-21 LAB — POCT GLYCOSYLATED HEMOGLOBIN (HGB A1C): Hemoglobin A1C: 5

## 2015-11-21 LAB — TSH: TSH: 1.95 mIU/L (ref 0.40–4.50)

## 2015-11-21 MED ORDER — OMEPRAZOLE 40 MG PO CPDR: 40.0000 mg | DELAYED_RELEASE_CAPSULE | Freq: Every day | ORAL | Status: DC

## 2015-11-21 MED FILL — OMEPRAZOLE DR 40 MG CAPSULE: 40 | 30 days supply | Qty: 30 | Fill #0

## 2015-11-21 NOTE — Assessment & Plan Note (Signed)
A: asthma in a former smoker, asymptomatic with rare albuterol use  P: Continue albuterol prn

## 2015-11-21 NOTE — Progress Notes (Signed)
Subjective:  Patient ID: Darren [name], male    DOB: [birth year]  Age: 55 y.o. MRN: [id number]  CC: Hypothyroidism and Gastroesophageal Reflux   HPI Darren [name] presents for    1. Hypothyroidism: taking synthroid. Has trouble swallowing at time. No swelling. No weight change. No fever or chills.   2. GERD: taking pepcid OTC. Has long standing GERD. No fever, chills, weight loss. Intermittent sharp CP. Non smoker. ETOH on weekends. There was a plan for EGD in the past this was never done.   3. Asthma: rare albuterol use. Former smoker, quit 10 years ago. No SOB or cough.    Social History  Substance Use Topics  . Smoking status: Former Smoker -- 26 years  . Smokeless tobacco: Not on file  . Alcohol Use: Yes     Comment: ON WEEKENDS    Outpatient Prescriptions Prior to Visit  Medication Sig Dispense Refill  . albuterol (PROVENTIL HFA;VENTOLIN HFA) 108 (90 BASE) MCG/ACT inhaler Inhale 2 puffs into the lungs every 6 (six) hours as needed for wheezing. For shortness of breath 3 Inhaler 3  . budesonide-formoterol (SYMBICORT) 160-4.5 MCG/ACT inhaler Inhale 2 puffs into the lungs 2 (two) times daily. 3 Inhaler 3  . famotidine (PEPCID) 20 MG tablet Take 1 tablet (20 mg total) by mouth daily. For reflux 30 tablet 3  . HYDROcodone-acetaminophen (NORCO/VICODIN) 5-325 MG tablet Take 1 tablet by mouth every 6 (six) hours as needed for moderate pain. 10 tablet 0  . levothyroxine (SYNTHROID, LEVOTHROID) 175 MCG tablet Take 1 tablet (175 mcg total) by mouth daily. 90 tablet 3  . methocarbamol (ROBAXIN) 500 MG tablet Take 1 tablet (500 mg total) by mouth 2 (two) times daily. 20 tablet 0  . naproxen sodium (ANAPROX) 550 MG tablet Take 1 tablet (550 mg total) by mouth 2 (two) times daily with a meal. 15 tablet 0   No facility-administered medications prior to visit.    ROS Review of Systems  Constitutional: Negative for fever, chills, fatigue and unexpected weight change.  HENT: Positive  for trouble swallowing.   Eyes: Negative for visual disturbance.  Respiratory: Negative for cough and shortness of breath.   Cardiovascular: Positive for chest pain. Negative for palpitations and leg swelling.  Gastrointestinal: Negative for nausea, vomiting, abdominal pain, diarrhea, constipation and blood in stool.  Endocrine: Negative for polydipsia, polyphagia and polyuria.  Musculoskeletal: Positive for back pain. Negative for myalgias, arthralgias, gait problem and neck pain.  Skin: Negative for rash.  Allergic/Immunologic: Negative for immunocompromised state.  Hematological: Negative for adenopathy. Does not bruise/bleed easily.  Psychiatric/Behavioral: Negative for suicidal ideas, sleep disturbance and dysphoric mood. The patient is not nervous/anxious.     Objective:  BP 128/85 mmHg  Pulse 67  Temp(Src) 98.8 F (37.1 C) (Oral)  Resp 16  Ht 5\' 3"  (1.6 m)  Wt 172 lb (78.019 kg)  BMI 30.48 kg/m2  SpO2 98%  BP/Weight 11/21/2015 11/12/2015 [id number]  Systolic BP 0000000 [id number] 0000000  Diastolic BP 85 [id number] 84  Wt. (Lbs) 172 167 167  BMI 30.48 27.79 27.79    Physical Exam  Constitutional: He appears well-developed and well-nourished. No distress.  HENT:  Head: Normocephalic and atraumatic.  Neck: Normal range of motion. Neck supple.  Cardiovascular: Normal rate, regular rhythm, normal heart sounds and intact distal pulses.   Pulmonary/Chest: Effort normal and breath sounds normal.  Musculoskeletal: He exhibits no edema.  Neurological: He is alert.  Skin: Skin is warm and dry. No rash noted.  No erythema.  Psychiatric: He has a normal mood and affect.    Lab Results  Component Value Date   HGBA1C 5 4 11/21/2015    Assessment & Plan:   Darren [name] was seen today for hypothyroidism and gastroesophageal reflux.  Diagnoses and all orders for this visit:  Healthcare maintenance -     POCT glycosylated hemoglobin (Hb A1C)  Hypothyroidism, unspecified hypothyroidism type -      TSH -     COMPLETE METABOLIC PANEL WITH GFR  Screening for HIV (human immunodeficiency virus) -     HIV antibody (with reflex)  Need for hepatitis C screening test -     Hepatitis C antibody, reflex  Gastroesophageal reflux disease, esophagitis presence not specified -     omeprazole (PRILOSEC) 40 MG capsule; Take 1 capsule (40 mg total) by mouth daily before supper.  Asthma, mild intermittent, uncomplicated    No orders of the defined types were placed in this encounter.    Follow-up: No Follow-up on file.   Boykin Nearing MD

## 2015-11-21 NOTE — Assessment & Plan Note (Signed)
A; hypothyroidism, euthyroid on exam  Med: compliant  P: TSH

## 2015-11-21 NOTE — Progress Notes (Signed)
Depression screen [facility] 2/9 11/21/2015  Decreased Interest 0  Down, Depressed, Hopeless 0  PHQ - 2 Score 0  Altered sleeping 2  Tired, decreased energy 1  Change in appetite 0  Feeling bad or failure about yourself  0  Trouble concentrating 0  Moving slowly or fidgety/restless 0  Suicidal thoughts 0  PHQ-9 Score 3    GAD 7 : Generalized Anxiety Score 11/21/2015  Nervous, Anxious, on Edge 0  Control/stop worrying 0  Worry too much - different things 1  Trouble relaxing 0  Restless 0  Easily annoyed or irritable 0  Afraid - awful might happen 0  Total GAD 7 Score 1

## 2015-11-21 NOTE — Assessment & Plan Note (Signed)
A; long standing GERD with intermittent sharp CP P: Change to omeprazole 40 mg 30 minutes before supper

## 2015-11-21 NOTE — Patient Instructions (Addendum)
Darren [name] was seen today for establish care.  Diagnoses and all orders for this visit:  Healthcare maintenance -     POCT glycosylated hemoglobin (Hb A1C)  Hypothyroidism, unspecified hypothyroidism type -     TSH -     COMPLETE METABOLIC PANEL WITH GFR  Screening for HIV (human immunodeficiency virus) -     HIV antibody (with reflex)  Need for hepatitis C screening test -     Hepatitis C antibody, reflex  Gastroesophageal reflux disease, esophagitis presence not specified -     omeprazole (PRILOSEC) 40 MG capsule; Take 1 capsule (40 mg total) by mouth daily before supper.   Take prilosec for GERD which is stronger than pepcid  F/u in 3 months for TSH check   Dr. Adrian Blackwater

## 2015-11-21 NOTE — Progress Notes (Signed)
Establish Care  No pain today  No tobacco user  No suicidal thoughts in the past two weeks

## 2015-11-22 LAB — HEPATITIS C ANTIBODY: HCV AB: NEGATIVE

## 2015-12-04 ENCOUNTER — Telehealth: Payer: Self-pay | Admitting: *Deleted

## 2015-12-04 NOTE — Telephone Encounter (Signed)
Pt stated sleeping now  Call back later

## 2015-12-04 NOTE — Telephone Encounter (Signed)
-----   Message from Boykin Nearing, MD sent at 11/22/2015  8:47 AM EST ----- All labs normal

## 2016-01-14 ENCOUNTER — Other Ambulatory Visit: Payer: Self-pay | Admitting: Internal Medicine

## 2016-01-16 ENCOUNTER — Other Ambulatory Visit: Payer: Self-pay | Admitting: *Deleted

## 2016-01-16 DIAGNOSIS — E039 Hypothyroidism, unspecified: Secondary | ICD-10-CM

## 2016-01-16 MED ORDER — LEVOTHYROXINE SODIUM 175 MCG PO TABS: 175.0000 ug | ORAL_TABLET | Freq: Every day | ORAL | Status: DC

## 2016-01-16 MED FILL — !SYNTHROID 175 MCG TABLET: 175 | 90 days supply | Qty: 90 | Fill #0

## 2016-03-30 DIAGNOSIS — H903 Sensorineural hearing loss, bilateral: Secondary | ICD-10-CM | POA: Insufficient documentation

## 2016-06-28 DIAGNOSIS — I7121 Aneurysm of the ascending aorta, without rupture: Secondary | ICD-10-CM

## 2016-06-28 DIAGNOSIS — I77810 Thoracic aortic ectasia: Secondary | ICD-10-CM

## 2016-06-28 DIAGNOSIS — I712 Thoracic aortic aneurysm, without rupture: Secondary | ICD-10-CM

## 2016-06-28 HISTORY — DX: Aneurysm of the ascending aorta, without rupture: I71.21

## 2016-06-28 HISTORY — DX: Thoracic aortic aneurysm, without rupture: I71.2

## 2016-06-28 HISTORY — DX: Thoracic aortic ectasia: I77.810

## 2016-07-06 ENCOUNTER — Ambulatory Visit: Payer: Self-pay | Attending: Internal Medicine

## 2016-07-09 ENCOUNTER — Ambulatory Visit: Payer: Self-pay | Attending: Family Medicine | Admitting: Family Medicine

## 2016-07-09 ENCOUNTER — Encounter: Payer: Self-pay | Admitting: Family Medicine

## 2016-07-09 VITALS — BP 136/86 | HR 69 | Temp 97.8°F | Ht 63.0 in | Wt 178.0 lb

## 2016-07-09 DIAGNOSIS — M545 Low back pain, unspecified: Secondary | ICD-10-CM | POA: Insufficient documentation

## 2016-07-09 DIAGNOSIS — K219 Gastro-esophageal reflux disease without esophagitis: Secondary | ICD-10-CM | POA: Insufficient documentation

## 2016-07-09 DIAGNOSIS — R1310 Dysphagia, unspecified: Secondary | ICD-10-CM | POA: Insufficient documentation

## 2016-07-09 DIAGNOSIS — Z23 Encounter for immunization: Secondary | ICD-10-CM

## 2016-07-09 DIAGNOSIS — G8929 Other chronic pain: Secondary | ICD-10-CM | POA: Insufficient documentation

## 2016-07-09 DIAGNOSIS — E039 Hypothyroidism, unspecified: Secondary | ICD-10-CM | POA: Insufficient documentation

## 2016-07-09 DIAGNOSIS — Z7951 Long term (current) use of inhaled steroids: Secondary | ICD-10-CM | POA: Insufficient documentation

## 2016-07-09 DIAGNOSIS — R1319 Other dysphagia: Secondary | ICD-10-CM

## 2016-07-09 DIAGNOSIS — J45909 Unspecified asthma, uncomplicated: Secondary | ICD-10-CM | POA: Insufficient documentation

## 2016-07-09 DIAGNOSIS — Z79899 Other long term (current) drug therapy: Secondary | ICD-10-CM | POA: Insufficient documentation

## 2016-07-09 LAB — CBC WITH DIFFERENTIAL/PLATELET
BASOS PCT: 0 %
Basophils Absolute: 0 cells/uL (ref 0–200)
EOS ABS: 336 {cells}/uL (ref 15–500)
Eosinophils Relative: 3 %
HEMATOCRIT: 42.6 % (ref 38.5–50.0)
Hemoglobin: 14.7 g/dL (ref 13.2–17.1)
LYMPHS PCT: 60 %
Lymphs Abs: 6720 cells/uL — ABNORMAL HIGH (ref 850–3900)
MCH: 27.7 pg (ref 27.0–33.0)
MCHC: 34.5 g/dL (ref 32.0–36.0)
MCV: 80.2 fL (ref 80.0–100.0)
MONO ABS: 448 {cells}/uL (ref 200–950)
MONOS PCT: 4 %
MPV: 11.2 fL (ref 7.5–12.5)
NEUTROS PCT: 33 %
Neutro Abs: 3696 cells/uL (ref 1500–7800)
PLATELETS: 176 10*3/uL (ref 140–400)
RBC: 5.31 MIL/uL (ref 4.20–5.80)
RDW: 13.1 % (ref 11.0–15.0)
WBC: 11.2 10*3/uL — AB (ref 3.8–10.8)

## 2016-07-09 LAB — COMPLETE METABOLIC PANEL WITH GFR
ALT: 52 U/L — AB (ref 9–46)
AST: 31 U/L (ref 10–35)
Albumin: 4.2 g/dL (ref 3.6–5.1)
Alkaline Phosphatase: 54 U/L (ref 40–115)
BILIRUBIN TOTAL: 0.6 mg/dL (ref 0.2–1.2)
BUN: 17 mg/dL (ref 7–25)
CALCIUM: 9.1 mg/dL (ref 8.6–10.3)
CHLORIDE: 103 mmol/L (ref 98–110)
CO2: 26 mmol/L (ref 20–31)
CREATININE: 0.79 mg/dL (ref 0.70–1.33)
GFR, Est Non African American: >89 mL/min (ref >=60)
Glucose, Bld: 107 mg/dL — ABNORMAL HIGH (ref 65–99)
Potassium: 4.4 mmol/L (ref 3.5–5.3)
Sodium: 137 mmol/L (ref 135–146)
TOTAL PROTEIN: 6.5 g/dL (ref 6.1–8.1)

## 2016-07-09 LAB — TSH: TSH: 2.43 mIU/L (ref 0.40–4.50)

## 2016-07-09 MED ORDER — LEVOTHYROXINE SODIUM 175 MCG PO TABS: 175.0000 ug | ORAL_TABLET | Freq: Every day | ORAL | 3 refills | Status: DC

## 2016-07-09 MED ORDER — BUDESONIDE-FORMOTEROL FUMARATE 160-4.5 MCG/ACT IN AERO: 2.0000 | INHALATION_SPRAY | Freq: Two times a day (BID) | RESPIRATORY_TRACT | 5 refills | Status: DC

## 2016-07-09 MED ORDER — ALBUTEROL SULFATE HFA 108 (90 BASE) MCG/ACT IN AERS: 2.0000 | INHALATION_SPRAY | Freq: Four times a day (QID) | RESPIRATORY_TRACT | 5 refills | Status: DC | PRN

## 2016-07-09 MED ORDER — PANTOPRAZOLE SODIUM 40 MG PO TBEC: 40.0000 mg | DELAYED_RELEASE_TABLET | Freq: Two times a day (BID) | ORAL | 5 refills | Status: DC

## 2016-07-09 MED ORDER — ACETAMINOPHEN-CODEINE #3 300-30 MG PO TABS: 1.0000 | ORAL_TABLET | Freq: Two times a day (BID) | ORAL | 0 refills | Status: DC | PRN

## 2016-07-09 MED ORDER — GABAPENTIN 100 MG PO CAPS: ORAL_CAPSULE | ORAL | 0 refills | Status: DC

## 2016-07-09 MED FILL — ACETAMINOPHEN/COD #3 TABLET: 300-30 | 30 days supply | Qty: 60 | Fill #0

## 2016-07-09 MED FILL — ?LEVOTHYROXINE 175 MCG TABL: 175 | 30 days supply | Qty: 30 | Fill #0

## 2016-07-09 MED FILL — ?PANTOPRAZOLE SOD DR 40MG: 40 MG | 30 days supply | Qty: 60 | Fill #0

## 2016-07-09 MED FILL — GABAPENTIN 100 MG CAPSULE: 100 | 37 days supply | Qty: 90 | Fill #0

## 2016-07-09 NOTE — Patient Instructions (Addendum)
Darren [name] was seen today for dysphagia.  Diagnoses and all orders for this visit:  Gastroesophageal reflux disease, esophagitis presence not specified -     pantoprazole (PROTONIX) 40 MG tablet; Take 1 tablet (40 mg total) by mouth 2 (two) times daily before a meal.  Asthma, chronic, unspecified asthma severity, uncomplicated -     budesonide-formoterol (SYMBICORT) 160-4.5 MCG/ACT inhaler; Inhale 2 puffs into the lungs 2 (two) times daily. -     albuterol (PROVENTIL HFA;VENTOLIN HFA) 108 (90 Base) MCG/ACT inhaler; Inhale 2 puffs into the lungs every 6 (six) hours as needed for wheezing. For shortness of breath  Hypothyroidism, unspecified type -     levothyroxine (SYNTHROID, LEVOTHROID) 175 MCG tablet; Take 1 tablet (175 mcg total) by mouth daily. -     TSH  Odynophagia -     COMPLETE METABOLIC PANEL WITH GFR -     CBC with Differential -     DG Chest 2 View; Future -     DG Esophagus; Future  Esophageal dysphagia -     COMPLETE METABOLIC PANEL WITH GFR -     CBC with Differential -     DG Chest 2 View; Future -     DG Esophagus; Future  Chronic midline low back pain without sciatica -     gabapentin (NEURONTIN) 100 MG capsule; Take 100 mg nightly for one week, then 200 mg nightly for one week, then 300 mg nightly -     acetaminophen-codeine (TYLENOL #3) 300-30 MG tablet; Take 1 tablet by mouth 2 (two) times daily as needed.  Other orders -     Flu Vaccine QUAD 36+ mos IM   Stop aleve Avoid alcohol Start protonix 40 mg twice daily with meals for GERD    You will be called with lab results  F/u in 2-3 weeks for trouble swallowing  Dr. Adrian Blackwater   Dysphagia Swallowing problems (dysphagia) occur when solids and liquids seem to stick in your throat on the way down to your stomach, or the food takes longer to get to the stomach. Other symptoms include regurgitating food, noises coming from the throat, chest discomfort with swallowing, and a feeling of fullness or the feeling of  something being stuck in your throat when swallowing. When blockage in your throat is complete, it may be associated with drooling. CAUSES  Problems with swallowing may occur because of problems with the muscles. The food cannot be propelled in the usual manner into your stomach. You may have ulcers, scar tissue, or inflammation in the tube down which food travels from your mouth to your stomach (esophagus), which blocks food from passing normally into the stomach. Causes of inflammation include:  Acid reflux from your stomach into your esophagus.  Infection.  Radiation treatment for cancer.  Medicines taken without enough fluids to wash them down into your stomach. You may have nerve problems that prevent signals from being sent to the muscles of your esophagus to contract and move your food down to your stomach. Globus pharyngeus is a relatively common problem in which there is a sense of an obstruction or difficulty in swallowing, without any physical abnormalities of the swallowing passages being found. This problem usually improves over time with reassurance and testing to rule out other causes. DIAGNOSIS Dysphagia can be diagnosed and its cause can be determined by tests in which you swallow a white substance that helps illuminate the inside of your throat (contrast medium) while X-rays are taken. Sometimes a flexible telescope  that is inserted down your throat (endoscopy) to look at your esophagus and stomach is used. TREATMENT   If the dysphagia is caused by acid reflux or infection, medicines may be used.  If the dysphagia is caused by problems with your swallowing muscles, swallowing therapy may be used to help you strengthen your swallowing muscles.  If the dysphagia is caused by a blockage or mass, procedures to remove the blockage may be done. HOME CARE INSTRUCTIONS  Try to eat soft food that is easier to swallow and check your weight on a daily basis to be sure that it is not  decreasing.  Be sure to drink liquids when sitting upright (not lying down). SEEK MEDICAL CARE IF:  You are losing weight because you are unable to swallow.  You are coughing when you drink liquids (aspiration).  You are coughing up partially digested food. SEEK IMMEDIATE MEDICAL CARE IF:  You are unable to swallow your own saliva .  You are having shortness of breath or a fever, or both.  You have a hoarse voice along with difficulty swallowing. MAKE SURE YOU:  Understand these instructions.  Will watch your condition.  Will get help right away if you are not doing well or get worse.   This information is not intended to replace advice given to you by your health care provider. Make sure you discuss any questions you have with your health care provider.   Document Released: 09/11/2000 Document Revised: 10/05/2014 Document Reviewed: 03/03/2013 Elsevier Interactive Patient Education [employer].

## 2016-07-09 NOTE — Assessment & Plan Note (Signed)
Pain and difficulty swallowing studies Patient is a poor historian regarding the timeline but reports some symptoms for hte past year. He has hx of smoking, current ETOH, GERD, current NSAID use. I am concerned for esophageal mass   Plan: protonix 40 mg BID Stop all NSAID Avoid alcohol Chest x-ray and barium swallow ordered, if unrevealing GI referral for EGD

## 2016-07-09 NOTE — Assessment & Plan Note (Signed)
Midline low back pain x 8 months following work injury. No sciatica. Pain is uncontrolled   Plan: Add gabapentin Add tylenol #3 Stop NSAID

## 2016-07-09 NOTE — Progress Notes (Signed)
Pt is getting flu shot and tdap.

## 2016-07-09 NOTE — Progress Notes (Signed)
Subjective:  Patient ID: Darren [name], male    DOB: Jan 14, 1961  Age: 55 y.o. MRN: [id number]  CC: Dysphagia   HPI BRANDOL BRIXEY has hypothyroidism, GERD, back pain he presents for   1. Trouble swallowing: started sometime at the beginning of this year. Former smoker, smoked for about 30 years. Quit in 2005. Use to smoke 1 PPD.  Current alcohol drinker every other week. Drinks beer about 12 back.  Have pain when swallowing solids. Just a little pain with swallowing liquids. Liquids help the food go down. Only pain with eating. Also noticed hoarseness. He has not noticed weight loss. Some weight gain. He takes aleve two a day for back pain. He has no history of radiation to head or neck. He takes pepcid OTC for GERD.   2. Back pain: had back injury at work in 11/12/2015. This was a workman's compensation case. He was treated with PT and injections in his back for pain. Back pain in mid back. He fell out of the bed last week and had repeat back injury. Current pain level in back is 9/10. Pain is in middle of low back. Non radiating. Burning with bending forward or lifting.  He has been taking 2 aleve a day for back pain. No weakness loss. No groin or gluteal numbness. No loss of urine or stool.  He reports that he has and MRI available for review    Social History  Substance Use Topics  . Smoking status: Former Smoker    Years: 26.00  . Smokeless tobacco: Not on file  . Alcohol use Yes     Comment: ON WEEKENDS    Outpatient Medications Prior to Visit  Medication Sig Dispense Refill  . albuterol (PROVENTIL HFA;VENTOLIN HFA) 108 (90 BASE) MCG/ACT inhaler Inhale 2 puffs into the lungs every 6 (six) hours as needed for wheezing. For shortness of breath 3 Inhaler 3  . budesonide-formoterol (SYMBICORT) 160-4.5 MCG/ACT inhaler Inhale 2 puffs into the lungs 2 (two) times daily. 3 Inhaler 3  . famotidine (PEPCID) 20 MG tablet Take 1 tablet (20 mg total) by mouth daily. For reflux 30 tablet 3  .  levothyroxine (SYNTHROID, LEVOTHROID) 175 MCG tablet Take 1 tablet (175 mcg total) by mouth daily. 90 tablet 3  . HYDROcodone-acetaminophen (NORCO/VICODIN) 5-325 MG tablet Take 1 tablet by mouth every 6 (six) hours as needed for moderate pain. (Patient not taking: Reported on 07/09/2016) 10 tablet 0  . methocarbamol (ROBAXIN) 500 MG tablet Take 1 tablet (500 mg total) by mouth 2 (two) times daily. (Patient not taking: Reported on 07/09/2016) 20 tablet 0  . naproxen sodium (ANAPROX) 550 MG tablet Take 1 tablet (550 mg total) by mouth 2 (two) times daily with a meal. (Patient not taking: Reported on 07/09/2016) 15 tablet 0  . omeprazole (PRILOSEC) 40 MG capsule Take 1 capsule (40 mg total) by mouth daily before supper. (Patient not taking: Reported on 07/09/2016) 30 capsule 3   No facility-administered medications prior to visit.     ROS Review of Systems  Constitutional: Negative for chills, fatigue, fever and unexpected weight change.  HENT: Positive for trouble swallowing.   Eyes: Negative for visual disturbance.  Respiratory: Negative for cough and shortness of breath.   Cardiovascular: Negative for chest pain, palpitations and leg swelling.  Gastrointestinal: Negative for abdominal pain, blood in stool, constipation, diarrhea, nausea and vomiting.  Endocrine: Negative for polydipsia, polyphagia and polyuria.  Musculoskeletal: Positive for back pain. Negative for arthralgias, gait problem,  myalgias and neck pain.  Skin: Negative for rash.  Allergic/Immunologic: Negative for immunocompromised state.  Hematological: Negative for adenopathy. Does not bruise/bleed easily.  Psychiatric/Behavioral: Negative for dysphoric mood, sleep disturbance and suicidal ideas. The patient is not nervous/anxious.     Objective:  BP 136/86 (BP Location: Right Arm, Patient Position: Sitting, Cuff Size: Small)   Pulse 69   Temp 97.8 F (36.6 C) (Oral)   Ht 5\' 3"  (1.6 m)   Wt 178 lb (80.7 kg)   SpO2 95%    BMI 31.53 kg/m   BP/Weight 07/09/2016 11/21/2015 [id number]  Systolic BP XX123456 [id number]  Diastolic BP 86 85 [id number]  Wt. (Lbs) 178 172 167  BMI 31.53 30.48 27.79   Wt Readings from Last 3 Encounters:  07/09/16 178 lb (80.7 kg)  11/21/15 172 lb (78 kg)  11/12/15 167 lb (75.8 kg)    Physical Exam  Constitutional: He appears well-developed and well-nourished. No distress.  HENT:  Head: Normocephalic and atraumatic.  Right Ear: External ear normal.  Left Ear: External ear normal.  Ears:  Nose: Nose normal.  Mouth/Throat: Oropharynx is clear and moist. No oropharyngeal exudate.  Neck: Normal range of motion. Neck supple. No thyromegaly present.  Cardiovascular: Normal rate, regular rhythm, normal heart sounds and intact distal pulses.   Pulmonary/Chest: Effort normal and breath sounds normal.  Musculoskeletal: He exhibits no edema.       Lumbar back: He exhibits tenderness (midline lumbar ).  Back Exam: Back: Normal Curvature, no deformities or CVA tenderness  Paraspinal Tenderness: absent   LE Strength 5/5  LE Sensation: in tact  LE Reflexes 2+ and symmetric  Straight leg raise: negative   Lymphadenopathy:    He has no cervical adenopathy.  Neurological: He is alert.  Skin: Skin is warm and dry. No rash noted. No erythema.  Psychiatric: He has a normal mood and affect.    Assessment & Plan:  Cloid was seen today for dysphagia.  Diagnoses and all orders for this visit:  Gastroesophageal reflux disease, esophagitis presence not specified -     pantoprazole (PROTONIX) 40 MG tablet; Take 1 tablet (40 mg total) by mouth 2 (two) times daily before a meal.  Asthma, chronic, unspecified asthma severity, uncomplicated -     budesonide-formoterol (SYMBICORT) 160-4.5 MCG/ACT inhaler; Inhale 2 puffs into the lungs 2 (two) times daily. -     albuterol (PROVENTIL HFA;VENTOLIN HFA) 108 (90 Base) MCG/ACT inhaler; Inhale 2 puffs into the lungs every 6 (six) hours as needed for wheezing. For  shortness of breath  Hypothyroidism, unspecified type -     levothyroxine (SYNTHROID, LEVOTHROID) 175 MCG tablet; Take 1 tablet (175 mcg total) by mouth daily. -     TSH  Odynophagia -     COMPLETE METABOLIC PANEL WITH GFR -     CBC with Differential -     DG Chest 2 View; Future -     DG Esophagus; Future  Esophageal dysphagia -     COMPLETE METABOLIC PANEL WITH GFR -     CBC with Differential -     DG Chest 2 View; Future -     DG Esophagus; Future  Chronic midline low back pain without sciatica -     gabapentin (NEURONTIN) 100 MG capsule; Take 100 mg nightly for one week, then 200 mg nightly for one week, then 300 mg nightly -     acetaminophen-codeine (TYLENOL #3) 300-30 MG tablet; Take 1 tablet by mouth 2 (two) times  daily as needed.  Other orders -     Flu Vaccine QUAD 36+ mos IM -     Tdap vaccine greater than or equal to 7yo IM   There are no diagnoses linked to this encounter.  No orders of the defined types were placed in this encounter.   Follow-up: Return in about 3 weeks (around 07/30/2016) for dysphagia .   Boykin Nearing MD

## 2016-07-10 LAB — PATHOLOGIST SMEAR REVIEW

## 2016-07-13 ENCOUNTER — Ambulatory Visit: Payer: PRIVATE HEALTH INSURANCE | Attending: Family Medicine | Admitting: Family Medicine

## 2016-07-13 ENCOUNTER — Encounter: Payer: Self-pay | Admitting: Family Medicine

## 2016-07-13 VITALS — BP 136/92 | HR 77 | Temp 97.8°F | Ht 63.0 in | Wt 179.2 lb

## 2016-07-13 DIAGNOSIS — Z87891 Personal history of nicotine dependence: Secondary | ICD-10-CM | POA: Insufficient documentation

## 2016-07-13 DIAGNOSIS — Z79899 Other long term (current) drug therapy: Secondary | ICD-10-CM | POA: Insufficient documentation

## 2016-07-13 DIAGNOSIS — E039 Hypothyroidism, unspecified: Secondary | ICD-10-CM | POA: Insufficient documentation

## 2016-07-13 DIAGNOSIS — Z7951 Long term (current) use of inhaled steroids: Secondary | ICD-10-CM | POA: Insufficient documentation

## 2016-07-13 DIAGNOSIS — K219 Gastro-esophageal reflux disease without esophagitis: Secondary | ICD-10-CM | POA: Insufficient documentation

## 2016-07-13 DIAGNOSIS — M25572 Pain in left ankle and joints of left foot: Secondary | ICD-10-CM | POA: Insufficient documentation

## 2016-07-13 DIAGNOSIS — M545 Low back pain: Secondary | ICD-10-CM | POA: Insufficient documentation

## 2016-07-13 DIAGNOSIS — M79605 Pain in left leg: Secondary | ICD-10-CM | POA: Insufficient documentation

## 2016-07-13 MED ORDER — KETOROLAC TROMETHAMINE 60 MG/2ML IM SOLN
60.0000 mg | Freq: Once | INTRAMUSCULAR | Status: AC
  Administered 2016-07-13: 60 mg via INTRAMUSCULAR

## 2016-07-13 NOTE — Progress Notes (Signed)
Subjective:  Patient ID: Darren [name], male    DOB: 1961/06/09  Age: 55 y.o. MRN: [id number]  CC: Leg Pain   HPI Darren [name] has GERD, hypothyroidism, chronic low back pain, recent swallowing difficulty he presents for   1.R leg pain: started 2 days ago. Woke up with pain in his R leg and R ankle. Mild ankle swelling. No injury. No redness. Also with pain in R calf. Pain is worse with walking. No fever or chills.    Social History  Substance Use Topics  . Smoking status: Former Smoker    Years: 26.00  . Smokeless tobacco: Not on file  . Alcohol use Yes     Comment: ON WEEKENDS   Outpatient Medications Prior to Visit  Medication Sig Dispense Refill  . acetaminophen-codeine (TYLENOL #3) 300-30 MG tablet Take 1 tablet by mouth 2 (two) times daily as needed. 60 tablet 0  . gabapentin (NEURONTIN) 100 MG capsule Take 100 mg nightly for one week, then 200 mg nightly for one week, then 300 mg nightly 90 capsule 0  . levothyroxine (SYNTHROID, LEVOTHROID) 175 MCG tablet Take 1 tablet (175 mcg total) by mouth daily. 90 tablet 3  . pantoprazole (PROTONIX) 40 MG tablet Take 1 tablet (40 mg total) by mouth 2 (two) times daily before a meal. 60 tablet 5  . albuterol (PROVENTIL HFA;VENTOLIN HFA) 108 (90 Base) MCG/ACT inhaler Inhale 2 puffs into the lungs every 6 (six) hours as needed for wheezing. For shortness of breath (Patient not taking: Reported on 07/13/2016) 3 Inhaler 5  . budesonide-formoterol (SYMBICORT) 160-4.5 MCG/ACT inhaler Inhale 2 puffs into the lungs 2 (two) times daily. (Patient not taking: Reported on 07/13/2016) 3 Inhaler 5   No facility-administered medications prior to visit.     ROS Review of Systems  Constitutional: Negative for chills, fatigue, fever and unexpected weight change.  HENT: Positive for trouble swallowing.   Eyes: Negative for visual disturbance.  Respiratory: Negative for cough and shortness of breath.   Cardiovascular: Negative for chest pain,  palpitations and leg swelling.  Gastrointestinal: Negative for abdominal pain, blood in stool, constipation, diarrhea, nausea and vomiting.  Endocrine: Negative for polydipsia, polyphagia and polyuria.  Musculoskeletal: Positive for arthralgias (L ankle ) and back pain. Negative for gait problem, myalgias and neck pain.  Skin: Negative for rash.  Allergic/Immunologic: Negative for immunocompromised state.  Hematological: Negative for adenopathy. Does not bruise/bleed easily.  Psychiatric/Behavioral: Negative for dysphoric mood, sleep disturbance and suicidal ideas. The patient is not nervous/anxious.     Objective:  BP (!) 136/92 (Patient Position: Sitting, Cuff Size: Small)   Pulse 77   Temp 97.8 F (36.6 C) (Oral)   Ht 5\' 3"  (1.6 m)   Wt 179 lb 3.2 oz (81.3 kg)   SpO2 98%   BMI 31.74 kg/m   BP/Weight 07/13/2016 07/09/2016 [id number]  Systolic BP XX123456 [id number] 0000000  Diastolic BP 92 86 85  Wt. (Lbs) 179.2 178 172  BMI 31.74 31.53 30.48    Physical Exam  Constitutional: He appears well-developed and well-nourished. No distress.  HENT:  Head: Normocephalic and atraumatic.  Right Ear: External ear normal.  Left Ear: External ear normal.  Ears:  Nose: Nose normal.  Mouth/Throat: Oropharynx is clear and moist. No oropharyngeal exudate.  Neck: Normal range of motion. Neck supple. No thyromegaly present.  Cardiovascular: Normal rate, regular rhythm, normal heart sounds and intact distal pulses.   Pulmonary/Chest: Effort normal and breath sounds normal.  Musculoskeletal: He exhibits  no edema.       Right ankle: He exhibits swelling (mild lateral ankle swellign ). Tenderness. Lateral malleolus and medial malleolus tenderness found.       Lumbar back: He exhibits tenderness (midline lumbar ).  Back Exam: Back: Normal Curvature, no deformities or CVA tenderness  Paraspinal Tenderness: absent   LE Strength 5/5  LE Sensation: in tact  LE Reflexes 2+ and symmetric  Straight leg raise:  negative   Lymphadenopathy:    He has no cervical adenopathy.  Neurological: He is alert.  Skin: Skin is warm and dry. No rash noted. No erythema.  Psychiatric: He has a normal mood and affect.     Assessment & Plan:  Darren [name] was seen today for leg pain.  Diagnoses and all orders for this visit:  Left leg pain -     Uric Acid -     D-dimer, quantitative (not at [facility]) -     ketorolac (TORADOL) injection 60 mg; Inject 2 mLs (60 mg total) into the muscle once.  Acute left ankle pain -     Cancel: DG Ankle Complete Left; Future -     DG Ankle 2 Views Right; Future  There are no diagnoses linked to this encounter.  No orders of the defined types were placed in this encounter.   Follow-up: Return in about 1 week (around 07/20/2016) for L leg pain, swallow study review .   Boykin Nearing MD

## 2016-07-13 NOTE — Patient Instructions (Addendum)
Darren [name] was seen today for leg pain.  Diagnoses and all orders for this visit:  Left leg pain -     Uric Acid -     D-dimer, quantitative (not at [facility]) -     ketorolac (TORADOL) injection 60 mg; Inject 2 mLs (60 mg total) into the muscle once.  Acute left ankle pain -     DG Ankle Complete Left; Future   F/u in 1 week for Left ankle pain and to review your swallow study   Dr. Adrian Blackwater

## 2016-07-13 NOTE — Progress Notes (Signed)
Pt is in the office today for leg pain

## 2016-07-14 ENCOUNTER — Ambulatory Visit (HOSPITAL_COMMUNITY)
Admission: RE | Admit: 2016-07-14 | Discharge: 2016-07-14 | Disposition: A | Discharge status: Home / Self Care | Payer: PRIVATE HEALTH INSURANCE | Source: Ambulatory Visit | Attending: Family Medicine | Admitting: Family Medicine

## 2016-07-14 ENCOUNTER — Emergency Department (HOSPITAL_COMMUNITY)
Admission: EM | Admit: 2016-07-14 | Discharge: 2016-07-14 | Disposition: A | Discharge status: Home / Self Care | Payer: No Typology Code available for payment source | Attending: Emergency Medicine | Admitting: Emergency Medicine

## 2016-07-14 ENCOUNTER — Emergency Department (HOSPITAL_COMMUNITY): Payer: No Typology Code available for payment source

## 2016-07-14 DIAGNOSIS — J449 Chronic obstructive pulmonary disease, unspecified: Secondary | ICD-10-CM | POA: Diagnosis not present

## 2016-07-14 DIAGNOSIS — M25572 Pain in left ankle and joints of left foot: Secondary | ICD-10-CM | POA: Insufficient documentation

## 2016-07-14 DIAGNOSIS — R1310 Dysphagia, unspecified: Secondary | ICD-10-CM

## 2016-07-14 DIAGNOSIS — E039 Hypothyroidism, unspecified: Secondary | ICD-10-CM | POA: Diagnosis not present

## 2016-07-14 DIAGNOSIS — R1319 Other dysphagia: Secondary | ICD-10-CM

## 2016-07-14 DIAGNOSIS — M545 Low back pain, unspecified: Secondary | ICD-10-CM

## 2016-07-14 DIAGNOSIS — R0789 Other chest pain: Secondary | ICD-10-CM

## 2016-07-14 DIAGNOSIS — Z87891 Personal history of nicotine dependence: Secondary | ICD-10-CM | POA: Diagnosis not present

## 2016-07-14 DIAGNOSIS — S299XXA Unspecified injury of thorax, initial encounter: Secondary | ICD-10-CM | POA: Diagnosis not present

## 2016-07-14 DIAGNOSIS — Y999 Unspecified external cause status: Secondary | ICD-10-CM | POA: Diagnosis not present

## 2016-07-14 DIAGNOSIS — Y92410 Unspecified street and highway as the place of occurrence of the external cause: Secondary | ICD-10-CM | POA: Insufficient documentation

## 2016-07-14 DIAGNOSIS — Y939 Activity, unspecified: Secondary | ICD-10-CM | POA: Insufficient documentation

## 2016-07-14 DIAGNOSIS — I10 Essential (primary) hypertension: Secondary | ICD-10-CM | POA: Insufficient documentation

## 2016-07-14 DIAGNOSIS — G8929 Other chronic pain: Secondary | ICD-10-CM | POA: Diagnosis not present

## 2016-07-14 DIAGNOSIS — M79605 Pain in left leg: Secondary | ICD-10-CM | POA: Insufficient documentation

## 2016-07-14 DIAGNOSIS — R933 Abnormal findings on diagnostic imaging of other parts of digestive tract: Secondary | ICD-10-CM | POA: Insufficient documentation

## 2016-07-14 LAB — URIC ACID: Uric Acid, Serum: 6.1 mg/dL (ref 4.0–8.0)

## 2016-07-14 LAB — D-DIMER, QUANTITATIVE (NOT AT ARMC): D DIMER QUANT: 0.32 ug{FEU}/mL (ref <0.50)

## 2016-07-14 MED ORDER — KETOROLAC TROMETHAMINE 15 MG/ML IJ SOLN: 15.0000 mg | Freq: Once | INTRAMUSCULAR | Status: DC

## 2016-07-14 NOTE — ED Notes (Signed)
Pt provided with d/c instructions at this time. Pt verbalizes understanding of d/c instructions as well as follow up procedure after d/c. No new RX at time of d/c.  Pt in no apparent distress at this time. Pt ambulatory at time of d/c.   
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
## 2016-07-14 NOTE — Assessment & Plan Note (Signed)
Acute pain in L leg and ankle with mild L ankle swelling IM toradol today (patient reports that he does not have money for Rx)  D dimer and uric check Pain is possibly radiculopathy for lumbar area  Close f/u with plan for course of oral steroids if pain persist
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
## 2016-07-14 NOTE — Discharge Instructions (Signed)
Please read attached information. If you experience any new or worsening signs or symptoms please return to the emergency room for evaluation. Please follow-up with your primary care provider or specialist as discussed. Please use medication prescribed only as directed and discontinue taking if you have any concerning signs or symptoms.   °

## 2016-07-15 ENCOUNTER — Telehealth: Payer: Self-pay

## 2016-07-15 ENCOUNTER — Other Ambulatory Visit: Payer: Self-pay | Admitting: Family Medicine

## 2016-07-15 DIAGNOSIS — R1310 Dysphagia, unspecified: Secondary | ICD-10-CM

## 2016-07-15 NOTE — Telephone Encounter (Signed)
Pt was called on 10/18 and informed of results.

## 2016-07-15 NOTE — Progress Notes (Signed)
Pt is schedule for his CT on October 20th 2017 @3pm  pt will need to be there at 245pm @ [place] and can't have anything to eat or drink 4 hours prior pt is aware and doesn't have any questions or concerns

## 2016-07-15 NOTE — Assessment & Plan Note (Signed)
Swallow study reveals that there is compression of the esophagus from something outside of the esophagus (mediastinum)  Swallowing function is preserved CT chest with contrast has been ordered to evaluate further

## 2016-07-17 ENCOUNTER — Telehealth: Payer: Self-pay | Admitting: Family Medicine

## 2016-07-17 ENCOUNTER — Ambulatory Visit (HOSPITAL_COMMUNITY)
Admission: RE | Admit: 2016-07-17 | Discharge: 2016-07-17 | Disposition: A | Discharge status: Home / Self Care | Payer: Self-pay | Source: Ambulatory Visit | Attending: Family Medicine | Admitting: Family Medicine

## 2016-07-17 ENCOUNTER — Encounter (HOSPITAL_COMMUNITY): Payer: Self-pay

## 2016-07-17 DIAGNOSIS — R1310 Dysphagia, unspecified: Secondary | ICD-10-CM | POA: Insufficient documentation

## 2016-07-17 DIAGNOSIS — K2289 Other specified disease of esophagus: Secondary | ICD-10-CM

## 2016-07-17 DIAGNOSIS — I7789 Other specified disorders of arteries and arterioles: Secondary | ICD-10-CM

## 2016-07-17 DIAGNOSIS — K228 Other specified diseases of esophagus: Secondary | ICD-10-CM

## 2016-07-17 DIAGNOSIS — I77819 Aortic ectasia, unspecified site: Secondary | ICD-10-CM | POA: Insufficient documentation

## 2016-07-17 MED ORDER — IOPAMIDOL (ISOVUE-300) INJECTION 61%
INTRAVENOUS | Status: AC
  Administered 2016-07-17: 75 mL
  Filled 2016-07-17: qty 75

## 2016-07-17 NOTE — Telephone Encounter (Signed)
Patient is still concern about right ankle. He is still experiencing pain....  Please follow up
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
## 2016-07-31 ENCOUNTER — Ambulatory Visit: Payer: Self-pay | Attending: Family Medicine | Admitting: Family Medicine

## 2016-07-31 ENCOUNTER — Encounter: Payer: Self-pay | Admitting: Family Medicine

## 2016-07-31 VITALS — BP 130/92 | HR 58 | Temp 98.1°F | Ht 63.0 in | Wt 178.8 lb

## 2016-07-31 DIAGNOSIS — Z87891 Personal history of nicotine dependence: Secondary | ICD-10-CM | POA: Insufficient documentation

## 2016-07-31 DIAGNOSIS — M545 Low back pain, unspecified: Secondary | ICD-10-CM

## 2016-07-31 DIAGNOSIS — I77810 Thoracic aortic ectasia: Secondary | ICD-10-CM

## 2016-07-31 DIAGNOSIS — I10 Essential (primary) hypertension: Secondary | ICD-10-CM

## 2016-07-31 DIAGNOSIS — K219 Gastro-esophageal reflux disease without esophagitis: Secondary | ICD-10-CM | POA: Insufficient documentation

## 2016-07-31 DIAGNOSIS — R1310 Dysphagia, unspecified: Secondary | ICD-10-CM | POA: Insufficient documentation

## 2016-07-31 DIAGNOSIS — R350 Frequency of micturition: Secondary | ICD-10-CM

## 2016-07-31 DIAGNOSIS — E039 Hypothyroidism, unspecified: Secondary | ICD-10-CM | POA: Insufficient documentation

## 2016-07-31 DIAGNOSIS — G8929 Other chronic pain: Secondary | ICD-10-CM

## 2016-07-31 DIAGNOSIS — J439 Emphysema, unspecified: Secondary | ICD-10-CM | POA: Insufficient documentation

## 2016-07-31 DIAGNOSIS — M79604 Pain in right leg: Secondary | ICD-10-CM | POA: Insufficient documentation

## 2016-07-31 LAB — POCT URINALYSIS DIPSTICK
BILIRUBIN UA: NEGATIVE
Glucose, UA: NEGATIVE
KETONES UA: NEGATIVE
LEUKOCYTES UA: NEGATIVE
Nitrite, UA: NEGATIVE
PH UA: 7.5
PROTEIN UA: NEGATIVE
RBC UA: NEGATIVE
SPEC GRAV UA: 1.015
Urobilinogen, UA: 0.2

## 2016-07-31 LAB — HEMOCCULT GUIAC POC 1CARD (OFFICE): Fecal Occult Blood, POC: NEGATIVE

## 2016-07-31 MED ORDER — METOPROLOL SUCCINATE ER 25 MG PO TB24: 25.0000 mg | ORAL_TABLET | Freq: Every day | ORAL | 3 refills | Status: DC

## 2016-07-31 MED ORDER — GABAPENTIN 300 MG PO CAPS: 300.0000 mg | ORAL_CAPSULE | Freq: Every day | ORAL | 2 refills | Status: DC

## 2016-07-31 MED ORDER — LOSARTAN POTASSIUM 25 MG PO TABS
25.0000 mg | ORAL_TABLET | Freq: Every day | ORAL | 2 refills | Status: DC
Start: 2016-07-31 — End: 2016-09-07

## 2016-07-31 MED ORDER — METHOCARBAMOL 500 MG PO TABS
500.0000 mg | ORAL_TABLET | Freq: Every day | ORAL | 0 refills | Status: DC
Start: 2016-07-31 — End: 2016-09-18

## 2016-07-31 MED FILL — METHOCARBAMOL 500 MG TABLET: 500 | 30 days supply | Qty: 30 | Fill #0

## 2016-07-31 MED FILL — ?GABAPENTIN 300 MG CAPSULE: 300 | 30 days supply | Qty: 30 | Fill #0

## 2016-07-31 MED FILL — LOSARTAN POTASSIUM 25 MG TA: 25 | 30 days supply | Qty: 30 | Fill #0

## 2016-07-31 NOTE — Patient Instructions (Addendum)
Darren [name] was seen today for leg pain.  Diagnoses and all orders for this visit:  Chronic midline low back pain without sciatica -     gabapentin (NEURONTIN) 300 MG capsule; Take 1 capsule (300 mg total) by mouth at bedtime. -     methocarbamol (ROBAXIN) 500 MG tablet; Take 1 tablet (500 mg total) by mouth at bedtime. -     Ambulatory referral to Neurosurgery  Urinary frequency -     Cancel: Urinalysis - Glucose/Protein -     Hemoccult - 1 Card (office) -     POCT urinalysis dipstick  Hypertension, unspecified type -     Discontinue: metoprolol succinate (TOPROL-XL) 25 MG 24 hr tablet; Take 1 tablet (25 mg total) by mouth daily. -     losartan (COZAAR) 25 MG tablet; Take 1 tablet (25 mg total) by mouth daily.  Dilation of thoracic aorta (HCC) -     Discontinue: metoprolol succinate (TOPROL-XL) 25 MG 24 hr tablet; Take 1 tablet (25 mg total) by mouth daily.     Increase tylenol #3 to two pills at night  Please drop of records regarding your back for me to review   F/u in 3 week for chronic back pain   Dr. Adrian Blackwater

## 2016-07-31 NOTE — Assessment & Plan Note (Signed)
A: dilation of thoracic aorta with HTN, ex-smoker P: Losartan for BP control  Patient has appt with cardiothoracic surgery
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
## 2016-07-31 NOTE — Assessment & Plan Note (Signed)
A: low back pain due to bulging disc and degenerative arthritis P: Referral to neurosurgery

## 2016-08-03 ENCOUNTER — Encounter: Payer: Self-pay | Admitting: Internal Medicine

## 2016-08-05 ENCOUNTER — Institutional Professional Consult (permissible substitution) (INDEPENDENT_AMBULATORY_CARE_PROVIDER_SITE_OTHER): Payer: Self-pay | Admitting: Surgery

## 2016-08-05 ENCOUNTER — Encounter: Payer: Self-pay | Admitting: Surgery

## 2016-08-05 VITALS — BP 130/90 | HR 64 | Resp 20 | Ht 63.0 in | Wt 178.0 lb

## 2016-08-05 DIAGNOSIS — I712 Thoracic aortic aneurysm, without rupture, unspecified: Secondary | ICD-10-CM
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
## 2016-08-10 ENCOUNTER — Telehealth: Payer: Self-pay | Admitting: Family Medicine

## 2016-08-10 DIAGNOSIS — K219 Gastro-esophageal reflux disease without esophagitis: Secondary | ICD-10-CM

## 2016-08-10 NOTE — Telephone Encounter (Signed)
Pt. Called requesting a refill on Pantoprazole. Please f/u

## 2016-08-11 MED ORDER — PANTOPRAZOLE SODIUM 40 MG PO TBEC: 40.0000 mg | DELAYED_RELEASE_TABLET | Freq: Every day | ORAL | 3 refills | Status: DC

## 2016-08-11 MED FILL — ?PANTOPRAZOLE SOD DR 40MG: 40 MG | 30 days supply | Qty: 30 | Fill #0

## 2016-08-11 NOTE — Telephone Encounter (Signed)
protonix refilled

## 2016-08-12 ENCOUNTER — Telehealth: Payer: Self-pay | Admitting: Internal Medicine

## 2016-08-12 ENCOUNTER — Telehealth: Payer: Self-pay | Admitting: Gastroenterology

## 2016-08-12 NOTE — Telephone Encounter (Signed)
The lady from the [facility] also said that she would call the patient to let him be aware that he has a consultation visit scheduled with [place] prior to scheduling procedure and she would let us know if he chooses not to keep this appointment.

## 2016-08-12 NOTE — Telephone Encounter (Signed)
Someone called from [facility] in regards to the patient. She said that he called them and was confused about coming to see [place] for "an interview". I told her that he was referred to [place] as a new patient and was scheduled a consult visit prior to scheduling his procedure. I told her that he has called our office twice being difficult and she said that she was very aware of his behavior and would speak to him about that. She said that he was concerned about transportation to [place] and would let us know if he chooses to keep his office visit or not.

## 2016-08-12 NOTE — Telephone Encounter (Signed)
[phone]   Patient called very agitated wanting to know why he has to come all the way here for an office visit when there "Is someone across the street" that can do it.  Was told that we are the only ones that take the [facility] financial assistance.  Wants to speak to someone asap because he has a life and cant sit by the phone

## 2016-08-12 NOTE — Telephone Encounter (Signed)
I spoke with the patient and he stated he was frustrated that he had to drive to [place].  He stated he would like to keep his appointment here and he stated that he would like to keep his appointment here.

## 2016-08-12 NOTE — Telephone Encounter (Signed)
Pt called and started fussing as soon as I answered the phone about he has been wasting his time sitting by the phone and no one has called him back. I told him if he had things to do go ahead and do them, but at the moment the office manager wasn't back yet and there's a message for her to call him when she returns. Patient continues to say how he is wasting his time waiting on a phone call, he has a life and things he needed to do and why in the hell does he have to continue waiting on a call. I ended call because there was no reasoning with him.

## 2016-08-13 MED FILL — ?LEVOTHYROXINE 175 MCG TABL: 175 | 30 days supply | Qty: 30 | Fill #1

## 2016-08-14 ENCOUNTER — Telehealth: Payer: Self-pay | Admitting: Family Medicine

## 2016-08-14 NOTE — Telephone Encounter (Signed)
Patient states he has appointment this coming week. RN suggested over-the-counter "Corcidin" and nasal saline spray for congestion. Patient advised that his pharmacist is good resource since they would have a list of all of his medications too.  Patient verbalized understanding. Priscille Heidelberg, RN, BSN

## 2016-08-14 NOTE — Telephone Encounter (Signed)
Patient is stating he is not feeling well, experiencing headache possibly from cold.  He would like to receive advice as to what he might be able to take over the counter for the weekend to help and that might not interfere with his other medications...  Please followup

## 2016-08-17 ENCOUNTER — Encounter: Payer: Self-pay | Admitting: Family Medicine

## 2016-08-17 ENCOUNTER — Ambulatory Visit: Payer: Self-pay | Attending: Family Medicine | Admitting: Family Medicine

## 2016-08-17 VITALS — BP 132/84 | HR 66 | Temp 97.7°F | Resp 20 | Ht 63.0 in | Wt 178.0 lb

## 2016-08-17 DIAGNOSIS — G8929 Other chronic pain: Secondary | ICD-10-CM | POA: Insufficient documentation

## 2016-08-17 DIAGNOSIS — R05 Cough: Secondary | ICD-10-CM | POA: Insufficient documentation

## 2016-08-17 DIAGNOSIS — Z87891 Personal history of nicotine dependence: Secondary | ICD-10-CM | POA: Insufficient documentation

## 2016-08-17 DIAGNOSIS — Z79899 Other long term (current) drug therapy: Secondary | ICD-10-CM | POA: Insufficient documentation

## 2016-08-17 DIAGNOSIS — K219 Gastro-esophageal reflux disease without esophagitis: Secondary | ICD-10-CM | POA: Insufficient documentation

## 2016-08-17 DIAGNOSIS — M545 Low back pain, unspecified: Secondary | ICD-10-CM

## 2016-08-17 DIAGNOSIS — R1310 Dysphagia, unspecified: Secondary | ICD-10-CM | POA: Insufficient documentation

## 2016-08-17 DIAGNOSIS — B9789 Other viral agents as the cause of diseases classified elsewhere: Secondary | ICD-10-CM

## 2016-08-17 DIAGNOSIS — J069 Acute upper respiratory infection, unspecified: Secondary | ICD-10-CM | POA: Insufficient documentation

## 2016-08-17 DIAGNOSIS — E039 Hypothyroidism, unspecified: Secondary | ICD-10-CM | POA: Insufficient documentation

## 2016-08-17 MED ORDER — METHYLPREDNISOLONE 4 MG PO TBPK: ORAL_TABLET | ORAL | 0 refills | Status: DC

## 2016-08-17 MED FILL — ?METHYLPREDNISOLONE 4 MG TA: 4 | 7 days supply | Qty: 21 | Fill #0

## 2016-08-17 NOTE — Progress Notes (Signed)
F/u back pain. C/o nasal congestion, non-productive cough, sneezing x 5 days.  Been taking Coricidin
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
## 2016-08-17 NOTE — Patient Instructions (Addendum)
Darren [name] was seen today for nasal congestion and cough.  Diagnoses and all orders for this visit:  Chronic midline low back pain without sciatica  Acute left-sided low back pain without sciatica -     methylPREDNISolone (MEDROL DOSEPAK) 4 MG TBPK tablet; Tape per packet insert. Take by mouth with food -     Ambulatory referral to Physical Therapy  Viral URI with cough  You have a viral URI with cough. For this please do the following:  1. Drink plenty of fluids. Hot tea, soup etc will help open your nasal passages. 2. Dextromethorphan 30 mg every 6-8 hrs (plain Robitussin) for cough suppression 3. Tylenol for pain up to 1000 mg three times daily.  4. Nasal saline-OTC nose spray for congestion.   F/u for chest pain, shortness of breath, persistent high fever.   F/u in 2 weeks for back pain   Dr. Adrian Blackwater

## 2016-08-18 DIAGNOSIS — J069 Acute upper respiratory infection, unspecified: Secondary | ICD-10-CM | POA: Insufficient documentation

## 2016-08-18 DIAGNOSIS — M545 Low back pain, unspecified: Secondary | ICD-10-CM | POA: Insufficient documentation

## 2016-08-18 DIAGNOSIS — B9789 Other viral agents as the cause of diseases classified elsewhere: Secondary | ICD-10-CM
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
## 2016-08-18 NOTE — Assessment & Plan Note (Signed)
Viral URI with cough No s/s of pneumonia or bronchitis  Symptom control discussed

## 2016-08-24 ENCOUNTER — Encounter: Payer: Self-pay | Admitting: Gastroenterology

## 2016-08-24 ENCOUNTER — Ambulatory Visit (INDEPENDENT_AMBULATORY_CARE_PROVIDER_SITE_OTHER): Payer: Self-pay | Admitting: Gastroenterology

## 2016-08-24 DIAGNOSIS — R933 Abnormal findings on diagnostic imaging of other parts of digestive tract: Secondary | ICD-10-CM | POA: Insufficient documentation

## 2016-08-24 DIAGNOSIS — K2270 Barrett's esophagus without dysplasia: Secondary | ICD-10-CM

## 2016-08-24 NOTE — Patient Instructions (Signed)
1. Upper endoscopy with possible esophageal dilation by Dr. Gala Romney in the near future. Please see separate instructions.

## 2016-08-24 NOTE — Progress Notes (Signed)
CC'D TO PCP °

## 2016-08-24 NOTE — Assessment & Plan Note (Signed)
55 year old gentleman with one-year history of progressive solid food esophageal dysphagia. History of Barrett's esophagus based on pathology report from 2001. No avail as EGDs performed. Recent barium pill esophagram showed filling defect along the anterior surface of the middle third of the esophagus felt to be related to extrinsic compression, barium tablet passed readily into the stomach. CT of the chest showed mild circumferential wall thickening of the upper esophagus. 2 cm segment of possible wall thickening versus stricture. EGD recommended.  Differential diagnosis includes peptic stricture, would be concerned about esophageal cancer given history of Barrett's esophagus although abnormal findings noted to be in midesophagus. Recommend EGD plus or minus esophageal dilation with Dr. Gala Romney in the OR given polypharmacy and etoh use.  I have discussed the risks, alternatives, benefits with regards to but not limited to the risk of reaction to medication, bleeding, infection, perforation and the patient is agreeable to proceed. Written consent to be obtained.  Based on findings, patient may require further imaging for recent right upper quadrant pain, elevated ALT. Further reformations to follow.

## 2016-08-24 NOTE — Progress Notes (Signed)
Primary Care Physician:  Minerva Ends, MD  Primary Gastroenterologist:  Garfield Cornea, MD   Chief Complaint  Patient presents with  . Dysphagia    HPI:  Darren [name] is a 55 y.o. male here At the request of PCP for further evaluation of dysphagia.   Patient previously seen by Dr. Verl Blalock at [facility] years ago, more than 10 years ago. He states he had an upper endoscopy was supposed to have another one but he was discharged from the practice. According to the patient, staff at [facility] reported that he was verbally threatening them and he was discharged. He denies the incident with regards to verbally threatening them. He did say they had a disagreement. At any rate he was discharged and never followed up. It is notable, endoscopy report is unavailable however pathology report from 2001 indicates Barrett's without dysplasia.  One year of dysphagia, has gotten worse over the past several months. He started getting worried because he had a friend who recently passed away with esophageal cancer. No difficulty swallowing liquids. Solid foods such as bread and meat he has to wash down. Associated pain. No vomiting. Some heartburn. On protonix. Wears dentures. Had self-limiting right upper quadrant pain last week. Bowel movements most days, usually loose. May go a day without a BM. No melena. Occasional bright red blood with wiping which he feels is coming from hemorrhoids.  No prior TCS.  Recently found to have a ascending aortic aneurysm incidentally on chest CT to further evaluate abnormal esophagus on barium pill esophagram. See imaging reports below.  Current Outpatient Prescriptions  Medication Sig Dispense Refill  . Ascorbic Acid (VITAMIN C) 100 MG tablet Take 100 mg by mouth daily. Not sure how much he is taking    . gabapentin (NEURONTIN) 300 MG capsule Take 1 capsule (300 mg total) by mouth at bedtime. 30 capsule 2  . levothyroxine (SYNTHROID, LEVOTHROID) 175 MCG tablet  Take 1 tablet (175 mcg total) by mouth daily. 90 tablet 3  . losartan (COZAAR) 25 MG tablet Take 1 tablet (25 mg total) by mouth daily. 30 tablet 2  . methocarbamol (ROBAXIN) 500 MG tablet Take 1 tablet (500 mg total) by mouth at bedtime. 30 tablet 0  . methylPREDNISolone (MEDROL DOSEPAK) 4 MG TBPK tablet Tape per packet insert. Take by mouth with food 21 tablet 0  . pantoprazole (PROTONIX) 40 MG tablet Take 1 tablet (40 mg total) by mouth daily. 30 tablet 3   No current facility-administered medications for this visit.     Allergies as of 08/24/2016  . (No Known Allergies)    Past Medical History:  Diagnosis Date  . Angina    PATIENT STATES HE HAD EPISODE OF PAIN IN CHEST WHEN PULLING AND STRAINING AT WORK  . Arthritis    per Medical clearance form.  . Ascending aortic aneurysm ([place]) 06/2016   4.2 cm  . Barrett esophagus 2001   path report in EPIC 2001 c/w Barrett's. endoscopy report unavailable.  Marland Kitchen COPD (chronic obstructive pulmonary disease) ([place])    per medical clearance form.  . Depression    per Medical Clearance form(09/18/11)  . Frequency of urination   . GERD (gastroesophageal reflux disease)    per Medical clearance form.  . Hypertension    Per Medical Clearance form.  . Hypothyroidism    per Medical Clearance form.  . Shortness of breath    WITH EXERTION     Past Surgical History:  Procedure Laterality Date  .  ESOPHAGOGASTRODUODENOSCOPY  2001   barrett's per path  . [place]  . MULTIPLE EXTRACTIONS WITH ALVEOLOPLASTY  11/30/2011   Procedure: MULTIPLE EXTRACION WITH ALVEOLOPLASTY;  Surgeon: Gae Bon, DDS;  Location: [place];  Service: Oral Surgery;  Laterality: Bilateral;    Family History  Problem Relation Age of Onset  . Heart disease Father   . Cancer Father     lung  . Stroke Father   . Lung cancer Mother   . Thyroid disease Sister   . Colon cancer Neg Hx   . Esophageal cancer Neg Hx     Social History   Social History   . Marital status: Single    Spouse name: N/A  . Number of children: N/A  . Years of education: N/A   Occupational History  . Not on file.   Social History Main Topics  . Smoking status: Former Smoker    Years: 26.00  . Smokeless tobacco: Never Used     Comment: Quit in 2005  . Alcohol use Yes     Comment: ON WEEKENDS  . Drug use: No  . Sexual activity: Not on file   Other Topics Concern  . Not on file   Social History Narrative  . No narrative on file      ROS:  General: Negative for anorexia, weight loss, fever, chills, fatigue, weakness. Eyes: Negative for vision changes.  ENT: Negative for hoarseness, , nasal congestion.See history of present illness CV: Negative for chest pain, angina, palpitations, dyspnea on exertion, peripheral edema.  Respiratory: Negative for dyspnea at rest, dyspnea on exertion, cough, sputum, wheezing. Recovering from recent viral upper respiratory infection. GI: See history of present illness. GU:  Negative for dysuria, hematuria, urinary incontinence, urinary frequency, nocturnal urination.  MS: Negative for joint pain, positive for chronic low back pain.  Derm: Negative for rash or itching.  Neuro: Negative for weakness, abnormal sensation, seizure, frequent headaches, memory loss, confusion.  Psych: Negative for anxiety, depression, suicidal ideation, hallucinations.  Endo: Negative for unusual weight change.  Heme: Negative for bruising or bleeding. Allergy: Negative for rash or hives.    Physical Examination:  BP 137/85   Pulse (!) 57   Temp 97.9 F (36.6 C) (Oral)   Ht 5\' 4"  (1.626 m)   Wt 178 lb 12.8 oz (81.1 kg)   BMI 30.69 kg/m    General: Well-nourished, well-developed in no acute distress.  Head: Normocephalic, atraumatic.   Eyes: Conjunctiva pink, no icterus. Mouth: Oropharyngeal mucosa moist and pink , no lesions erythema or exudate. Neck: Supple without thyromegaly, masses, or lymphadenopathy.  Lungs: Clear to  auscultation bilaterally.  Heart: Regular rate and rhythm, no murmurs rubs or gallops.  Abdomen: Bowel sounds are normal, nontender, nondistended, no hepatosplenomegaly or masses, no abdominal bruits or    hernia , no rebound or guarding.   Rectal: Not performed Extremities: No lower extremity edema. No clubbing or deformities.  Neuro: Alert and oriented x 4 , grossly normal neurologically.  Skin: Warm and dry, no rash or jaundice.   Psych: Alert and cooperative, normal mood and affect.  Labs: Heme negative recently.  Lab Results  Component Value Date   CREATININE 0.79 07/09/2016   BUN 17 07/09/2016   NA 137 07/09/2016   K 4.4 07/09/2016   CL 103 07/09/2016   CO2 26 07/09/2016   Lab Results  Component Value Date   ALT 52 (H) 07/09/2016   AST 31 07/09/2016  ALKPHOS 54 07/09/2016   BILITOT 0.6 07/09/2016   Lab Results  Component Value Date   WBC 11.2 (H) 07/09/2016   HGB 14.7 07/09/2016   HCT 42.6 07/09/2016   MCV 80.2 07/09/2016   PLT 176 07/09/2016   No results found for: LIPASE Lab Results  Component Value Date   TSH 2.43 07/09/2016   No results found for: INR, PROTIME  HCV Ab negative 10/2015  Imaging Studies: No results found. Study Result   CLINICAL DATA:  Difficulty swallowing for 1 year. History of COPD and GERD. Former smoker. Shortness of breath.  EXAM: CT CHEST WITH CONTRAST  TECHNIQUE: Multidetector CT imaging of the chest was performed during intravenous contrast administration.  CONTRAST:  55mL ISOVUE-300 IOPAMIDOL (ISOVUE-300) INJECTION 61%  COMPARISON:  Chest x-ray 07/14/2016  FINDINGS: Cardiovascular: Heart is normal size. Minimal calcified plaque at the takeoff of the right coronary artery. Mild ectasia of the ascending thoracic aorta measuring 4.2 cm in AP diameter.  Mediastinum/Nodes: Evidence of hilar or mediastinal adenopathy. Mild circumferential wall thickening of the upper esophagus. Loss of air column within the lumen  of the mid esophagus over a 2 cm segment which may be due to wall thickening/stricture. Remaining mediastinal structures are within normal.  Lungs/Pleura: Lungs are adequately inflated with mild emphysematous disease. 2 mm calcified granuloma right lower lobe. No evidence of focal airspace consolidation or effusion. Airways are within normal.  Upper Abdomen: Within normal.  Musculoskeletal: Mild degenerate change of the spine.  IMPRESSION: No acute cardiopulmonary disease.  Mild emphysematous disease.  Mild circumferential wall thickening of the upper esophagus which may be due to reflux esophagitis. 2 cm segment of possible wall thickening versus structure over the mid esophagus. Recommend further evaluation with barium swallow versus endoscopic correlation.  Mild ectasia of the ascending thoracic aorta measuring 4.2 cm in AP diameter. Recommend semi-annual imaging followup by CTA or MRA and referral to cardiothoracic surgery if not already obtained. This recommendation follows 2010 ACCF/AHA/AATS/ACR/ASA/SCA/SCAI/SIR/STS/SVM Guidelines for the Diagnosis and Management of Patients With Thoracic Aortic Disease. Circulation. 2010; 121: e266-e36.   Electronically Signed   By: Marin Olp M.D.   On: 07/17/2016 16:20  CLINICAL DATA:  55 year old male with history of hoarseness. Painful swallowing with solids and occasionally with liquids.  EXAM: ESOPHOGRAM / BARIUM SWALLOW / BARIUM TABLET STUDY  TECHNIQUE: Combined double contrast and single contrast examination performed using effervescent crystals, thick barium liquid, and thin barium liquid. The patient was observed with fluoroscopy swallowing a 13 mm barium sulphate tablet.  FLUOROSCOPY TIME:  Fluoroscopy Time:  1 minutes and 30 seconds.  COMPARISON:  No priors.  FINDINGS: Initial double contrast images of the esophagus demonstrated a smoothly marginated mucosal abnormality along the anterior  surface of the middle third of the esophagus, which is favored to represent mass effect from an extrinsic structure to the esophagus in the adjacent mediastinum. The mucosa is otherwise normal. Multiple single swallow attempts were observed, which demonstrated normal esophageal motility. Full column esophagram demonstrated no esophageal mass, stricture or esophageal ring. Small hiatal hernia. Water siphon test demonstrated extensive gastroesophageal reflux into the proximal third of the esophagus. A barium tablet was administered, which passed readily into the stomach.  IMPRESSION: 1. Today's esophogram demonstrates a filling defect along the anterior surface of the middle third of the esophagus, which is favored to be related to extrinsic compression from an adjacent structure in the mediastinum. Differential considerations include a prominent blood vessel such as a pulmonary artery, mediastinal lymph node,  or other mediastinal mass. This is less likely to represent a submucosal lesion associated with the esophagus. Initial evaluation with contrast enhanced chest CT is recommended in the near future to better evaluate this finding. These results will be called to the ordering clinician or representative by the Radiologist Assistant, and communication documented in the PACS or zVision Dashboard.   Electronically Signed   By: Vinnie Langton M.D.   On: 07/14/2016 12:22

## 2016-08-26 ENCOUNTER — Encounter: Payer: Self-pay | Admitting: General Practice

## 2016-08-26 ENCOUNTER — Other Ambulatory Visit: Payer: Self-pay

## 2016-08-26 DIAGNOSIS — K22710 Barrett's esophagus with low grade dysplasia: Secondary | ICD-10-CM

## 2016-08-26 DIAGNOSIS — R1310 Dysphagia, unspecified: Secondary | ICD-10-CM

## 2016-08-27 ENCOUNTER — Ambulatory Visit: Payer: Self-pay | Attending: Family Medicine | Admitting: Physician Assistant

## 2016-08-27 VITALS — BP 124/87 | HR 71 | Temp 98.0°F | Resp 16 | Wt 178.4 lb

## 2016-08-27 DIAGNOSIS — R350 Frequency of micturition: Secondary | ICD-10-CM | POA: Insufficient documentation

## 2016-08-27 DIAGNOSIS — I712 Thoracic aortic aneurysm, without rupture: Secondary | ICD-10-CM | POA: Insufficient documentation

## 2016-08-27 DIAGNOSIS — J069 Acute upper respiratory infection, unspecified: Secondary | ICD-10-CM | POA: Insufficient documentation

## 2016-08-27 DIAGNOSIS — K219 Gastro-esophageal reflux disease without esophagitis: Secondary | ICD-10-CM | POA: Insufficient documentation

## 2016-08-27 DIAGNOSIS — I10 Essential (primary) hypertension: Secondary | ICD-10-CM | POA: Insufficient documentation

## 2016-08-27 DIAGNOSIS — J449 Chronic obstructive pulmonary disease, unspecified: Secondary | ICD-10-CM | POA: Insufficient documentation

## 2016-08-27 DIAGNOSIS — E039 Hypothyroidism, unspecified: Secondary | ICD-10-CM | POA: Insufficient documentation

## 2016-08-27 DIAGNOSIS — F329 Major depressive disorder, single episode, unspecified: Secondary | ICD-10-CM | POA: Insufficient documentation

## 2016-08-27 DIAGNOSIS — K2270 Barrett's esophagus without dysplasia: Secondary | ICD-10-CM | POA: Insufficient documentation

## 2016-08-27 MED ORDER — BENZONATATE 200 MG PO CAPS: 200.0000 mg | ORAL_CAPSULE | Freq: Two times a day (BID) | ORAL | 0 refills | Status: DC | PRN

## 2016-08-27 MED ORDER — FLUTICASONE PROPIONATE 50 MCG/ACT NA SUSP: 2.0000 | Freq: Every day | NASAL | 6 refills | Status: DC

## 2016-08-27 MED FILL — BENZONATATE 100 MG CAPSULE: 100 | 15 days supply | Qty: 60 | Fill #0

## 2016-08-27 MED FILL — FLUTICASONE PROP 50 MCG SPR: 50 | 30 days supply | Qty: 16 | Fill #0

## 2016-08-27 NOTE — Progress Notes (Signed)
Darren [name], is a 55 y.o. male  [id number]  [id number]  DOB - 1961/04/10  Subjective:  Chief Complaint and HPI: Darren [name] is a 55 y.o. male here today to for 1 1/2 week h/o cough and sinus congestion.  Overall, this seems to be improving.  He is not coughing up any discolored mucus.  He does have sinus congestion without sinus pain.  No f/c.  No SOB.  He went for his GI referral and is being set up for upper endoscopy secondary to dysphagia.    ROS:   Constitutional:  No f/c, No night sweats, No unexplained weight loss. EENT:  No vision changes, No blurry vision, No hearing changes. No mouth, throat, or ear problems other than congestion.  Respiratory: +cough, No SOB Cardiac: No CP, no palpitations GI:  No abd pain, No N/V/D. GU: No Urinary s/sx Musculoskeletal: +ongoing back pain Neuro: No headache, no dizziness, no motor weakness.  Skin: No rash Endocrine:  No polydipsia. No polyuria.  Psych: Denies SI/HI  No problems updated.  ALLERGIES: No Known Allergies  PAST MEDICAL HISTORY: Past Medical History:  Diagnosis Date  . Angina    PATIENT STATES HE HAD EPISODE OF PAIN IN CHEST WHEN PULLING AND STRAINING AT WORK  . Arthritis    per Medical clearance form.  . Ascending aortic aneurysm ([place]) 06/2016   4.2 cm  . Barrett esophagus 2001   path report in [facility] 2001 c/w Barrett's. endoscopy report unavailable.  Marland Kitchen COPD (chronic obstructive pulmonary disease) ([place])    per medical clearance form.  . Depression    per Medical Clearance form(09/18/11)  . Frequency of urination   . GERD (gastroesophageal reflux disease)    per Medical clearance form.  . Hypertension    Per Medical Clearance form.  . Hypothyroidism    per Medical Clearance form.  . Shortness of breath    WITH EXERTION     MEDICATIONS AT HOME: Prior to Admission medications   Medication Sig Start Date End Date Taking? Authorizing Provider  Ascorbic Acid (VITAMIN C) 100 MG tablet Take 100 mg  by mouth daily. Not sure how much he is taking    Historical Provider, MD  benzonatate (TESSALON) 200 MG capsule Take 1 capsule (200 mg total) by mouth 2 (two) times daily as needed for cough. 08/27/16   Argentina Donovan, PA-C  fluticasone (FLONASE) 50 MCG/ACT nasal spray Place 2 sprays into both nostrils daily. 08/27/16   Argentina Donovan, PA-C  gabapentin (NEURONTIN) 300 MG capsule Take 1 capsule (300 mg total) by mouth at bedtime. 07/31/16   Josalyn Funches, MD  levothyroxine (SYNTHROID, LEVOTHROID) 175 MCG tablet Take 1 tablet (175 mcg total) by mouth daily. 07/09/16   Josalyn Funches, MD  losartan (COZAAR) 25 MG tablet Take 1 tablet (25 mg total) by mouth daily. 07/31/16   Josalyn Funches, MD  methocarbamol (ROBAXIN) 500 MG tablet Take 1 tablet (500 mg total) by mouth at bedtime. 07/31/16   Josalyn Funches, MD  methylPREDNISolone (MEDROL DOSEPAK) 4 MG TBPK tablet Tape per packet insert. Take by mouth with food 08/17/16   Boykin Nearing, MD  pantoprazole (PROTONIX) 40 MG tablet Take 1 tablet (40 mg total) by mouth daily. 08/11/16   Josalyn Funches, MD     Objective:  EXAM:   Vitals:   08/27/16 1559  BP: 124/87  Pulse: 71  Resp: 16  Temp: 98 F (36.7 C)  TempSrc: Oral  SpO2: 95%  Weight: 178 lb 6.4 oz (80.9  kg)    General appearance : A&OX3. NAD. Non-toxic-appearing HEENT: Atraumatic and Normocephalic.  PERRLA. EOM intact.  TM clear B. Mouth-MMM, post pharynx WNL w/o erythema, No PND.  +nasal congestion Neck: supple, no JVD. No cervical lymphadenopathy. No thyromegaly Chest/Lungs:  Breathing-non-labored, Good air entry bilaterally, breath sounds normal without rales, rhonchi, or wheezing  CVS: S1 S2 regular, no murmurs, gallops, rubs  Extremities: Bilateral Lower Ext shows no edema, both legs are warm to touch with = pulse throughout Neurology:  CN II-XII grossly intact, Non focal.   Psych:  TP linear. J/I WNL. Normal speech. Appropriate eye contact and affect.  Skin:  No  Rash  Data Review Lab Results  Component Value Date   HGBA1C 5 4 11/21/2015     Assessment & Plan   1. Acute upper respiratory infection Seems to be improving at this point and should resolve without antibiotics - benzonatate (TESSALON) 200 MG capsule; Take 1 capsule (200 mg total) by mouth 2 (two) times daily as needed for cough.  Dispense: 30 capsule; Refill: 0 - fluticasone (FLONASE) 50 MCG/ACT nasal spray; Place 2 sprays into both nostrils daily.  Dispense: 16 g; Refill: 6   Patient have been counseled extensively about nutrition and exercise  Return in about 1 week (around 09/03/2016) for f/up with Dr Erling Cruz back problem/injury.  The patient was given clear instructions to go to ER or return to medical center if symptoms don't improve, worsen or new problems develop. The patient verbalized understanding. The patient was told to call to get lab results if they haven't heard anything in the next week.     Freeman Caldron, PA-C [facility] and [facility], [place]   08/27/2016, 4:33 PMPatient ID: Darren [name], male   DOB: [birth year], 55 y.o.   MRN: [id number]

## 2016-09-01 ENCOUNTER — Telehealth: Payer: Self-pay | Admitting: Physical Therapy

## 2016-09-01 NOTE — Telephone Encounter (Signed)
08/24/16 spoke with patient, he is unsure if he wants to start PT

## 2016-09-03 MED FILL — GABAPENTIN 300 MG CAPSULE: 300 | 30 days supply | Qty: 30 | Fill #1

## 2016-09-03 MED FILL — LOSARTAN POTASSIUM 25 MG TA: 25 | 30 days supply | Qty: 30 | Fill #1

## 2016-09-07 ENCOUNTER — Ambulatory Visit: Payer: Self-pay | Attending: Family Medicine | Admitting: Family Medicine

## 2016-09-07 ENCOUNTER — Encounter: Payer: Self-pay | Admitting: Family Medicine

## 2016-09-07 DIAGNOSIS — M545 Low back pain, unspecified: Secondary | ICD-10-CM

## 2016-09-07 DIAGNOSIS — I10 Essential (primary) hypertension: Secondary | ICD-10-CM | POA: Insufficient documentation

## 2016-09-07 DIAGNOSIS — K219 Gastro-esophageal reflux disease without esophagitis: Secondary | ICD-10-CM | POA: Insufficient documentation

## 2016-09-07 DIAGNOSIS — E039 Hypothyroidism, unspecified: Secondary | ICD-10-CM | POA: Insufficient documentation

## 2016-09-07 DIAGNOSIS — Z87891 Personal history of nicotine dependence: Secondary | ICD-10-CM | POA: Insufficient documentation

## 2016-09-07 DIAGNOSIS — G8929 Other chronic pain: Secondary | ICD-10-CM | POA: Insufficient documentation

## 2016-09-07 DIAGNOSIS — M5136 Other intervertebral disc degeneration, lumbar region: Secondary | ICD-10-CM | POA: Insufficient documentation

## 2016-09-07 DIAGNOSIS — R1310 Dysphagia, unspecified: Secondary | ICD-10-CM | POA: Insufficient documentation

## 2016-09-07 MED ORDER — GABAPENTIN 300 MG PO CAPS: 300.0000 mg | ORAL_CAPSULE | Freq: Three times a day (TID) | ORAL | 2 refills | Status: DC

## 2016-09-07 MED ORDER — METHYLPREDNISOLONE 4 MG PO TBPK: ORAL_TABLET | ORAL | 0 refills | Status: DC

## 2016-09-07 MED ORDER — LOSARTAN POTASSIUM 25 MG PO TABS
25.0000 mg | ORAL_TABLET | Freq: Every day | ORAL | 5 refills | Status: DC
Start: 2016-09-07 — End: 2016-12-31

## 2016-09-07 MED FILL — ?METHYLPREDNISOLONE 4 MG TA: 4 | 7 days supply | Qty: 21 | Fill #0

## 2016-09-07 NOTE — Progress Notes (Signed)
Pt is here to follow up on back pain
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
## 2016-09-07 NOTE — Patient Instructions (Addendum)
Darren [name] was seen today for back pain.  Diagnoses and all orders for this visit:  Chronic midline low back pain without sciatica -     gabapentin (NEURONTIN) 300 MG capsule; Take 1 capsule (300 mg total) by mouth 3 (three) times daily.  Hypertension, unspecified type -     losartan (COZAAR) 25 MG tablet; Take 1 tablet (25 mg total) by mouth daily.  Acute left-sided low back pain without sciatica -     methylPREDNISolone (MEDROL DOSEPAK) 4 MG TBPK tablet; Tape per packet insert. Take by mouth with food  increase gabapentin to 300 mg twice daily for one week Then increase to 300 mg three times a day  Day 1: (2) tablets before breakfast, (1) after lunch, (1) after dinner, (2) at bedtime [24-mg]  Day 2: (1) before breakfast, (1) after lunch, (1) after dinner, (2) at bedtime [20-mg] Day 3: (1) before breakfast, (1) after lunch, (1) after dinner, (1) at bedtime [16-mg]  Day 4: (1) before breakfast, (1) after lunch, (1) at bedtime [12-mg]  Day 5: (1) before breakfast, (1) at bedtime [8-mg]  Day 6: (1) before breakfast [4-mg]   F/u in 6 weeks for back pain   Dr. Adrian Blackwater

## 2016-09-09 NOTE — Assessment & Plan Note (Signed)
Acute on chronic pain Depo medrol taper Gabapentin increased

## 2016-09-11 MED FILL — ?PANTOPRAZOLE SOD DR 40MG: 40 MG | 30 days supply | Qty: 30 | Fill #1

## 2016-09-14 ENCOUNTER — Ambulatory Visit: Payer: Self-pay | Attending: Family Medicine

## 2016-09-14 NOTE — Patient Instructions (Signed)
Darren [name]  09/14/2016     @PREFPERIOPPHARMACY @   Your procedure is scheduled on  09/18/2016 .  Report to [facility] at  1000  A.M.  Call this number if you have problems the morning of surgery:  [phone]   Remember:  Do not eat food or drink liquids after midnight.  Take these medicines the morning of surgery with A SIP OF WATER  Levothyroxine, losartan, protonix.   Do not wear jewelry, make-up or nail polish.  Do not wear lotions, powders, or perfumes, or deoderant.  Do not shave 48 hours prior to surgery.  Men may shave face and neck.  Do not bring valuables to the hospital.  [facility] is not responsible for any belongings or valuables.  Contacts, dentures or bridgework may not be worn into surgery.  Leave your suitcase in the car.  After surgery it may be brought to your room.  For patients admitted to the hospital, discharge time will be determined by your treatment team.  Patients discharged the day of surgery will not be allowed to drive home.   Name and phone number of your driver:   family Special instructions: Follow the diet instructions given to you by Dr Roseanne Kaufman office.  Please read over the following fact sheets that you were given. Anesthesia Post-op Instructions and Care and Recovery After Surgery       Esophagogastroduodenoscopy Introduction Esophagogastroduodenoscopy (EGD) is a procedure to examine the lining of the esophagus, stomach, and first part of the small intestine (duodenum). This procedure is done to check for problems such as inflammation, bleeding, ulcers, or growths. During this procedure, a long, flexible, lighted tube with a camera attached (endoscope) is inserted down the throat. Tell a health care provider about:  Any allergies you have.  All medicines you are taking, including vitamins, herbs, eye drops, creams, and over-the-counter medicines.  Any problems you or family members have had with anesthetic  medicines.  Any blood disorders you have.  Any surgeries you have had.  Any medical conditions you have.  Whether you are pregnant or may be pregnant. What are the risks? Generally, this is a safe procedure. However, problems may occur, including:  Infection.  Bleeding.  A tear (perforation) in the esophagus, stomach, or duodenum.  Trouble breathing.  Excessive sweating.  Spasms of the larynx.  A slowed heartbeat.  Low blood pressure. What happens before the procedure?  Follow instructions from your health care provider about eating or drinking restrictions.  Ask your health care provider about:  Changing or stopping your regular medicines. This is especially important if you are taking diabetes medicines or blood thinners.  Taking medicines such as aspirin and ibuprofen. These medicines can thin your blood. Do not take these medicines before your procedure if your health care provider instructs you not to.  Plan to have someone take you home after the procedure.  If you wear dentures, be ready to remove them before the procedure. What happens during the procedure?  To reduce your risk of infection, your health care team will wash or sanitize their hands.  An IV tube will be put in a vein in your hand or arm. You will get medicines and fluids through this tube.  You will be given one or more of the following:  A medicine to help you relax (sedative).  A medicine to numb the area (local anesthetic). This medicine may be sprayed into your throat.  It will make you feel more comfortable and keep you from gagging or coughing during the procedure.  A medicine for pain.  A mouth guard may be placed in your mouth to protect your teeth and to keep you from biting on the endoscope.  You will be asked to lie on your left side.  The endoscope will be lowered down your throat into your esophagus, stomach, and duodenum.  Air will be put into the endoscope. This will help  your health care provider see better.  The lining of your esophagus, stomach, and duodenum will be examined.  Your health care provider may:  Take a tissue sample so it can be looked at in a lab (biopsy).  Remove growths.  Remove objects (foreign bodies) that are stuck.  Treat any bleeding with medicines or other devices that stop tissue from bleeding.  Widen (dilate) or stretch narrowed areas of your esophagus and stomach.  The endoscope will be taken out. The procedure may vary among health care providers and hospitals. What happens after the procedure?  Your blood pressure, heart rate, breathing rate, and blood oxygen level will be monitored often until the medicines you were given have worn off.  Do not eat or drink anything until the numbing medicine has worn off and your gag reflex has returned. This information is not intended to replace advice given to you by your health care provider. Make sure you discuss any questions you have with your health care provider. Document Released: 01/15/2005 Document Revised: 02/20/2016 Document Reviewed: 08/08/2015  2017 Elsevier Esophagogastroduodenoscopy, Care After Introduction Refer to this sheet in the next few weeks. These instructions provide you with information about caring for yourself after your procedure. Your health care provider may also give you more specific instructions. Your treatment has been planned according to current medical practices, but problems sometimes occur. Call your health care provider if you have any problems or questions after your procedure. What can I expect after the procedure? After the procedure, it is common to have:  A sore throat.  Nausea.  Bloating.  Dizziness.  Fatigue. Follow these instructions at home:  Do not eat or drink anything until the numbing medicine (local anesthetic) has worn off and your gag reflex has returned. You will know that the local anesthetic has worn off when you  can swallow comfortably.  Do not drive for 24 hours if you received a medicine to help you relax (sedative).  If your health care provider took a tissue sample for testing during the procedure, make sure to get your test results. This is your responsibility. Ask your health care provider or the department performing the test when your results will be ready.  Keep all follow-up visits as told by your health care provider. This is important. Contact a health care provider if:  You cannot stop coughing.  You are not urinating.  You are urinating less than usual. Get help right away if:  You have trouble swallowing.  You cannot eat or drink.  You have throat or chest pain that gets worse.  You are dizzy or light-headed.  You faint.  You have nausea or vomiting.  You have chills.  You have a fever.  You have severe abdominal pain.  You have black, tarry, or bloody stools. This information is not intended to replace advice given to you by your health care provider. Make sure you discuss any questions you have with your health care provider. Document Released: 08/31/2012 Document Revised: 02/20/2016  Document Reviewed: 08/08/2015  2017 Elsevier  Esophageal Dilatation Esophageal dilatation is a procedure to open a blocked or narrowed part of the esophagus. The esophagus is the long tube in your throat that carries food and liquid from your mouth to your stomach. The procedure is also called esophageal dilation. You may need this procedure if you have a buildup of scar tissue in your esophagus that makes it difficult, painful, or even impossible to swallow. This can be caused by gastroesophageal reflux disease (GERD). In rare cases, people need this procedure because they have cancer of the esophagus or a problem with the way food moves through the esophagus. Sometimes you may need to have another dilatation to enlarge the opening of the esophagus gradually. Tell a health care  provider about:  Any allergies you have.  All medicines you are taking, including vitamins, herbs, eye drops, creams, and over-the-counter medicines.  Any problems you or family members have had with anesthetic medicines.  Any blood disorders you have.  Any surgeries you have had.  Any medical conditions you have.  Any antibiotic medicines you are required to take before dental procedures. What are the risks? Generally, this is a safe procedure. However, problems can occur and include:  Bleeding from a tear in the lining of the esophagus.  A hole (perforation) in the esophagus. What happens before the procedure?  Do not eat or drink anything after midnight on the night before the procedure or as directed by your health care provider.  Ask your health care provider about changing or stopping your regular medicines. This is especially important if you are taking diabetes medicines or blood thinners.  Plan to have someone take you home after the procedure. What happens during the procedure?  You will be given a medicine that makes you relaxed and sleepy (sedative).  A medicine may be sprayed or gargled to numb the back of the throat.  Your health care provider can use various instruments to do an esophageal dilatation. During the procedure, the instrument used will be placed in your mouth and passed down into your esophagus. Options include:  Simple dilators. This instrument is carefully placed in the esophagus to stretch it.  Guided wire bougies. In this method, a flexible tube (endoscope) is used to insert a wire into the esophagus. The dilator is passed over this wire to enlarge the esophagus. Then the wire is removed.  Balloon dilators. An endoscope with a small balloon at the end is passed down into the esophagus. Inflating the balloon gently stretches the esophagus and opens it up. What happens after the procedure?  Your blood pressure, heart rate, breathing rate, and  blood oxygen level will be monitored often until the medicines you were given have worn off.  Your throat may feel slightly sore and will probably still feel numb. This will improve slowly over time.  You will not be allowed to eat or drink until the throat numbness has resolved.  If this is a same-day procedure, you may be allowed to go home once you have been able to drink, urinate, and sit on the edge of the bed without nausea or dizziness.  If this is a same-day procedure, you should have a friend or family member with you for the next 24 hours after the procedure. This information is not intended to replace advice given to you by your health care provider. Make sure you discuss any questions you have with your health care provider. Document Released: 11/05/2005 Document  Revised: 02/20/2016 Document Reviewed: 01/24/2014 Elsevier Interactive Patient Education  2017 Elsevier Inc. PATIENT INSTRUCTIONS POST-ANESTHESIA  IMMEDIATELY FOLLOWING SURGERY:  Do not drive or operate machinery for the first twenty four hours after surgery.  Do not make any important decisions for twenty four hours after surgery or while taking narcotic pain medications or sedatives.  If you develop intractable nausea and vomiting or a severe headache please notify your doctor immediately.  FOLLOW-UP:  Please make an appointment with your surgeon as instructed. You do not need to follow up with anesthesia unless specifically instructed to do so.  WOUND CARE INSTRUCTIONS (if applicable):  Keep a dry clean dressing on the anesthesia/puncture wound site if there is drainage.  Once the wound has quit draining you may leave it open to air.  Generally you should leave the bandage intact for twenty four hours unless there is drainage.  If the epidural site drains for more than 36-48 hours please call the anesthesia department.  QUESTIONS?:  Please feel free to call your physician or the hospital operator if you have any  questions, and they will be happy to assist you.

## 2016-09-15 ENCOUNTER — Encounter (HOSPITAL_COMMUNITY)
Admission: RE | Admit: 2016-09-15 | Discharge: 2016-09-15 | Disposition: A | Discharge status: Home / Self Care | Payer: Self-pay | Source: Ambulatory Visit | Attending: Internal Medicine | Admitting: Internal Medicine

## 2016-09-15 ENCOUNTER — Encounter (HOSPITAL_COMMUNITY): Payer: Self-pay

## 2016-09-15 DIAGNOSIS — I10 Essential (primary) hypertension: Secondary | ICD-10-CM | POA: Insufficient documentation

## 2016-09-15 DIAGNOSIS — E039 Hypothyroidism, unspecified: Secondary | ICD-10-CM | POA: Insufficient documentation

## 2016-09-15 DIAGNOSIS — J449 Chronic obstructive pulmonary disease, unspecified: Secondary | ICD-10-CM | POA: Insufficient documentation

## 2016-09-15 DIAGNOSIS — Z01812 Encounter for preprocedural laboratory examination: Secondary | ICD-10-CM | POA: Insufficient documentation

## 2016-09-15 DIAGNOSIS — G8929 Other chronic pain: Secondary | ICD-10-CM | POA: Insufficient documentation

## 2016-09-15 DIAGNOSIS — I77810 Thoracic aortic ectasia: Secondary | ICD-10-CM | POA: Insufficient documentation

## 2016-09-15 DIAGNOSIS — R1310 Dysphagia, unspecified: Secondary | ICD-10-CM | POA: Insufficient documentation

## 2016-09-15 DIAGNOSIS — I517 Cardiomegaly: Secondary | ICD-10-CM | POA: Insufficient documentation

## 2016-09-15 DIAGNOSIS — M545 Low back pain: Secondary | ICD-10-CM | POA: Insufficient documentation

## 2016-09-15 DIAGNOSIS — K219 Gastro-esophageal reflux disease without esophagitis: Secondary | ICD-10-CM | POA: Insufficient documentation

## 2016-09-15 DIAGNOSIS — Z01810 Encounter for preprocedural cardiovascular examination: Secondary | ICD-10-CM | POA: Insufficient documentation

## 2016-09-15 DIAGNOSIS — I714 Abdominal aortic aneurysm, without rupture: Secondary | ICD-10-CM | POA: Insufficient documentation

## 2016-09-15 DIAGNOSIS — E559 Vitamin D deficiency, unspecified: Secondary | ICD-10-CM | POA: Insufficient documentation

## 2016-09-15 LAB — BASIC METABOLIC PANEL
Anion gap: 6 (ref 5–15)
BUN: 19 mg/dL (ref 6–20)
CALCIUM: 9.1 mg/dL (ref 8.9–10.3)
CO2: 28 mmol/L (ref 22–32)
CREATININE: 0.79 mg/dL (ref 0.61–1.24)
Chloride: 101 mmol/L (ref 101–111)
Glucose, Bld: 182 mg/dL — ABNORMAL HIGH (ref 65–99)
Potassium: 3.8 mmol/L (ref 3.5–5.1)
SODIUM: 135 mmol/L (ref 135–145)

## 2016-09-15 LAB — CBC WITH DIFFERENTIAL/PLATELET
BASOS ABS: 0 10*3/uL (ref 0.0–0.1)
BASOS PCT: 0 %
EOS ABS: 0 10*3/uL (ref 0.0–0.7)
Eosinophils Relative: 0 %
HCT: 44.9 % (ref 39.0–52.0)
Hemoglobin: 15.1 g/dL (ref 13.0–17.0)
LYMPHS PCT: 46 %
Lymphs Abs: 8.3 10*3/uL — ABNORMAL HIGH (ref 0.7–4.0)
MCH: 28.7 pg (ref 26.0–34.0)
MCHC: 33.6 g/dL (ref 30.0–36.0)
MCV: 85.4 fL (ref 78.0–100.0)
MONO ABS: 0.4 10*3/uL (ref 0.1–1.0)
Monocytes Relative: 2 %
NEUTROS ABS: 9.3 10*3/uL — AB (ref 1.7–7.7)
Neutrophils Relative %: 52 %
PLATELETS: 177 10*3/uL (ref 150–400)
RBC: 5.26 MIL/uL (ref 4.22–5.81)
RDW: 13.4 % (ref 11.5–15.5)
WBC: 18 10*3/uL — ABNORMAL HIGH (ref 4.0–10.5)

## 2016-09-15 LAB — PATHOLOGIST SMEAR REVIEW

## 2016-09-16 NOTE — Progress Notes (Signed)
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  Please set up referral.   Per RMR, send copy of labs and EKG to PCP and please touch base with PCP nurse to let them know there is an abnormal EKG that needs to be addressed.

## 2016-09-17 ENCOUNTER — Other Ambulatory Visit: Payer: Self-pay

## 2016-09-17 ENCOUNTER — Ambulatory Visit: Payer: Self-pay | Attending: Family Medicine | Admitting: Physical Therapy

## 2016-09-17 ENCOUNTER — Encounter: Payer: Self-pay | Admitting: Physical Therapy

## 2016-09-17 DIAGNOSIS — D72820 Lymphocytosis (symptomatic): Secondary | ICD-10-CM

## 2016-09-17 DIAGNOSIS — M545 Low back pain, unspecified: Secondary | ICD-10-CM

## 2016-09-17 DIAGNOSIS — M62830 Muscle spasm of back: Secondary | ICD-10-CM | POA: Insufficient documentation
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
## 2016-09-18 ENCOUNTER — Encounter (HOSPITAL_COMMUNITY): Payer: Self-pay | Admitting: *Deleted

## 2016-09-18 ENCOUNTER — Ambulatory Visit (HOSPITAL_COMMUNITY): Payer: Self-pay | Admitting: Anesthesiology

## 2016-09-18 ENCOUNTER — Encounter (HOSPITAL_COMMUNITY)
Admission: RE | Disposition: A | Discharge status: Home / Self Care | Payer: Self-pay | Source: Ambulatory Visit | Attending: Internal Medicine

## 2016-09-18 ENCOUNTER — Ambulatory Visit (HOSPITAL_COMMUNITY)
Admission: RE | Admit: 2016-09-18 | Discharge: 2016-09-18 | Disposition: A | Discharge status: Home / Self Care | Payer: Self-pay | Source: Ambulatory Visit | Attending: Internal Medicine | Admitting: Internal Medicine

## 2016-09-18 ENCOUNTER — Telehealth: Payer: Self-pay | Admitting: Family Medicine

## 2016-09-18 DIAGNOSIS — J449 Chronic obstructive pulmonary disease, unspecified: Secondary | ICD-10-CM | POA: Insufficient documentation

## 2016-09-18 DIAGNOSIS — K219 Gastro-esophageal reflux disease without esophagitis: Secondary | ICD-10-CM | POA: Insufficient documentation

## 2016-09-18 DIAGNOSIS — K222 Esophageal obstruction: Secondary | ICD-10-CM | POA: Insufficient documentation

## 2016-09-18 DIAGNOSIS — K228 Other specified diseases of esophagus: Secondary | ICD-10-CM

## 2016-09-18 DIAGNOSIS — R9431 Abnormal electrocardiogram [ECG] [EKG]: Secondary | ICD-10-CM | POA: Insufficient documentation

## 2016-09-18 DIAGNOSIS — K22710 Barrett's esophagus with low grade dysplasia: Secondary | ICD-10-CM

## 2016-09-18 DIAGNOSIS — Z8719 Personal history of other diseases of the digestive system: Secondary | ICD-10-CM | POA: Insufficient documentation

## 2016-09-18 DIAGNOSIS — Z87891 Personal history of nicotine dependence: Secondary | ICD-10-CM | POA: Insufficient documentation

## 2016-09-18 DIAGNOSIS — R1310 Dysphagia, unspecified: Secondary | ICD-10-CM

## 2016-09-18 DIAGNOSIS — Z79899 Other long term (current) drug therapy: Secondary | ICD-10-CM | POA: Insufficient documentation

## 2016-09-18 DIAGNOSIS — Z7951 Long term (current) use of inhaled steroids: Secondary | ICD-10-CM | POA: Insufficient documentation

## 2016-09-18 DIAGNOSIS — R933 Abnormal findings on diagnostic imaging of other parts of digestive tract: Secondary | ICD-10-CM

## 2016-09-18 DIAGNOSIS — I10 Essential (primary) hypertension: Secondary | ICD-10-CM | POA: Insufficient documentation

## 2016-09-18 DIAGNOSIS — E039 Hypothyroidism, unspecified: Secondary | ICD-10-CM | POA: Insufficient documentation

## 2016-09-18 DIAGNOSIS — M545 Low back pain: Secondary | ICD-10-CM | POA: Insufficient documentation

## 2016-09-18 HISTORY — PX: BIOPSY: SHX5522

## 2016-09-18 HISTORY — PX: ESOPHAGOGASTRODUODENOSCOPY (EGD) WITH PROPOFOL: SHX5813

## 2016-09-18 SURGERY — ESOPHAGOGASTRODUODENOSCOPY (EGD) WITH PROPOFOL
Anesthesia: Monitor Anesthesia Care

## 2016-09-18 MED ORDER — MIDAZOLAM HCL 2 MG/2ML IJ SOLN
INTRAMUSCULAR | Status: AC
  Filled 2016-09-18: qty 2

## 2016-09-18 MED ORDER — CHLORHEXIDINE GLUCONATE CLOTH 2 % EX PADS: 6.0000 | MEDICATED_PAD | Freq: Once | CUTANEOUS | Status: DC

## 2016-09-18 MED ORDER — LIDOCAINE HCL (CARDIAC) 10 MG/ML IV SOLN
INTRAVENOUS | Status: DC | PRN
Start: 2016-09-18 — End: 2016-09-18
  Administered 2016-09-18: 50 mg via INTRAVENOUS

## 2016-09-18 MED ORDER — PROPOFOL 10 MG/ML IV BOLUS
INTRAVENOUS | Status: DC | PRN
Start: 2016-09-18 — End: 2016-09-18
  Administered 2016-09-18: 20 mg via INTRAVENOUS

## 2016-09-18 MED ORDER — FENTANYL CITRATE (PF) 100 MCG/2ML IJ SOLN
INTRAMUSCULAR | Status: DC | PRN
  Administered 2016-09-18 (×2): 25 ug via INTRAVENOUS

## 2016-09-18 MED ORDER — LIDOCAINE VISCOUS 2 % MT SOLN
OROMUCOSAL | Status: DC | PRN
  Administered 2016-09-18: 4 mL via OROMUCOSAL

## 2016-09-18 MED ORDER — FENTANYL CITRATE (PF) 100 MCG/2ML IJ SOLN
INTRAMUSCULAR | Status: AC
  Filled 2016-09-18: qty 2

## 2016-09-18 MED ORDER — MIDAZOLAM HCL 5 MG/5ML IJ SOLN
INTRAMUSCULAR | Status: DC | PRN
  Administered 2016-09-18: 2 mg via INTRAVENOUS

## 2016-09-18 MED ORDER — LACTATED RINGERS IV SOLN
INTRAVENOUS | Status: DC | PRN
  Administered 2016-09-18: 11:00:00 via INTRAVENOUS

## 2016-09-18 MED ORDER — PROPOFOL 500 MG/50ML IV EMUL
INTRAVENOUS | Status: DC | PRN
  Administered 2016-09-18: 75 ug/kg/min via INTRAVENOUS

## 2016-09-18 MED ORDER — PROPOFOL 10 MG/ML IV BOLUS
INTRAVENOUS | Status: AC
  Filled 2016-09-18: qty 20

## 2016-09-18 MED ORDER — LACTATED RINGERS IV SOLN: INTRAVENOUS | Status: DC

## 2016-09-18 MED ORDER — LIDOCAINE VISCOUS 2 % MT SOLN
OROMUCOSAL | Status: AC
  Filled 2016-09-18: qty 15

## 2016-09-18 NOTE — Op Note (Signed)
[facility] Patient Name: Darren [name] Procedure Date: 09/18/2016 12:31 PM MRN: [id number] Date of Birth: 11-19-1960 Attending MD: Norvel Richards , MD CSN: [id number] Age: 55 Admit Type: Outpatient Procedure:                Upper GI endoscopy with esophageal biopsy Indications:              Abnormal CT of the GI tract Providers:                Norvel Richards, MD, Rosina Lowenstein, RN, Sherlyn Lees, Technician Referring MD:              Medicines:                Propofol per Anesthesia Complications:            No immediate complications. Estimated Blood Loss:     Estimated blood loss was minimal. Procedure:                Pre-Anesthesia Assessment:                           - Prior to the procedure, a History and Physical                            was performed, and patient medications and                            allergies were reviewed. The patient's tolerance of                            previous anesthesia was also reviewed. The risks                            and benefits of the procedure and the sedation                            options and risks were discussed with the patient.                            All questions were answered, and informed consent                            was obtained. Prior Anticoagulants: The patient has                            taken no previous anticoagulant or antiplatelet                            agents. ASA Grade Assessment: II - A patient with                            mild systemic disease. After reviewing the risks  and benefits, the patient was deemed in                            satisfactory condition to undergo the procedure.                           After obtaining informed consent, the endoscope was                            passed under direct vision. Throughout the                            procedure, the patient's blood pressure, pulse, and                   oxygen saturations were monitored continuously. The                            [id number] scope was introduced through the                            and advanced to the second part of duodenum. The                            upper GI endoscopy was accomplished without                            difficulty. The patient tolerated the procedure                            well. Scope In: 12:36:31 PM Scope Out: 12:43:39 PM Total Procedure Duration: 0 hours 7 minutes 8 seconds  Findings:      A moderate-sized (3cm) area of extrinsic compression was found in the       middle third of the esophagus. Overlying mucosa appeared normal.       Accentuated undulating Z line versus circumferential barrett's       epithelium within 1-2 cm of the GEJ. Tiny pseudodiverticula present at       this level as well. No esophagitis. No nodularity.      The entire examined stomach was normal. Pylorus patent.      The duodenal bulb and second portion of the duodenum were normal.      Abnormal distal esophagus biopsied with cold forceps for histology.       Estimated blood loss was minimal. No dilation performed. Impression:               - Extrinsic compression in the middle third of the                            esophagus.                           - Normal stomach.                           - Normal duodenal bulb and second portion of the  duodenum.                           - Abnormal distal esophagus. Biopsied. No esophagea                           I discussed my findings along with the previous                            esophagram and chest CT with Dr. Weber Cooks. No                            obvious radiograph explanation to explain                            compression. No mediastinal adenopathy seen as                            implied on the original CT report. No mass or blood                            vessel to implicate. Mass effect may be  arising out                            of the wall of the esophagus. Moderate Sedation:      Moderate (conscious) sedation was personally administered by an       anesthesia professional. The following parameters were monitored: oxygen       saturation, heart rate, blood pressure, respiratory rate, EKG, adequacy       of pulmonary ventilation, and response to care. Total physician       intraservice time was 15 minutes. Recommendation:           - Patient has a contact number available for                            emergencies. The signs and symptoms of potential                            delayed complications were discussed with the                            patient. Return to normal activities tomorrow.                            Written discharge instructions were provided to the                            patient.                           - Resume previous diet - Dysphagia 2 diet.                           - Continue present medications including Protonix  40 mg daily..                           - Refer to Dr. Ardis Hughs for EUS next month. Follow up                            on pathology. Procedure Code(s):        --- Professional ---                           [phone], Esophagogastroduodenoscopy, flexible,                            transoral; with biopsy, single or multiple Diagnosis Code(s):        --- Professional ---                           K22.2, Esophageal obstruction                           K22.8, Other specified diseases of esophagus                           R93.3, Abnormal findings on diagnostic imaging of                            other parts of digestive tract CPT copyright 2016 [employer]. All rights reserved. The codes documented in this report are preliminary and upon coder review may  be revised to meet current compliance requirements. Cristopher Estimable. Lorann Tani, MD Norvel Richards, MD 09/18/2016 1:14:42 PM This report  has been signed electronically. Number of Addenda: 0

## 2016-09-18 NOTE — Telephone Encounter (Signed)
Please call patient I see he is having GI procedure done today so he may not be reached until next week.   His heart tracing was abnormal  He will need repeat tracing and BP check at f/u appt  ECHO has been ordered to evaluate heart size of pumping function. Please schedule   Also cardiology referral placed  He is asked to f/u within the next 2 weeks

## 2016-09-18 NOTE — Telephone Encounter (Signed)
-----   Message from Claudina Lick, LPN sent at [id number]  3:47 PM EST ----- Routing this to Dr.Funches and Priscille Heidelberg RN  Please review abnormal EKG

## 2016-09-18 NOTE — Interval H&P Note (Signed)
History and Physical Interval Note:  09/18/2016 12:25 PM  Darren [name]  has presented today for surgery, with the diagnosis of dysphagia/history of barrett's  The various methods of treatment have been discussed with the patient and family. After consideration of risks, benefits and other options for treatment, the patient has consented to  Procedure(s) with comments: ESOPHAGOGASTRODUODENOSCOPY (EGD) WITH PROPOFOL (N/A) - 1200 MALONEY DILATION (N/A) as a surgical intervention .  The patient's history has been reviewed, patient examined, no change in status, stable for surgery.  I have reviewed the patient's chart and labs.  Questions were answered to the patient's satisfaction.     Darren [name]  No change. EGD/ED/biopsy as feasible appropriate per plan. The risks, benefits, limitations, alternatives and imponderables have been reviewed with the patient. Potential for esophageal dilation, biopsy, etc. have also been reviewed.  Questions have been answered. All parties agreeable.Marland Kitchen

## 2016-09-18 NOTE — Anesthesia Postprocedure Evaluation (Signed)
Anesthesia Post Note  Patient: Darren [name]  Procedure(s) Performed: Procedure(s) (LRB): ESOPHAGOGASTRODUODENOSCOPY (EGD) WITH PROPOFOL (N/A) BIOPSY  Patient location during evaluation: PACU Anesthesia Type: MAC Level of consciousness: awake Pain management: satisfactory to patient Vital Signs Assessment: post-procedure vital signs reviewed and stable Cardiovascular status: stable Anesthetic complications: no     Last Vitals:  Vitals:   09/18/16 1039 09/18/16 1248  BP: 125/87 112/85  Pulse: (!) 55 (!) 59  Resp:  10  Temp: 36.4 C 36.4 C    Last Pain:  Vitals:   09/18/16 1248  TempSrc:   PainSc: 0-No pain                 Hayato Guaman

## 2016-09-18 NOTE — Telephone Encounter (Signed)
Left message requested return call to [facility]:  Attempted to advise of message from Dr Adrian Blackwater: His heart tracing was abnormal  He will need repeat tracing and BP check at f/u appt  ECHO has been ordered to evaluate heart size of pumping function. Please schedule   Also cardiology referral placed  He is asked to f/u within the next 2 weeks

## 2016-09-18 NOTE — Telephone Encounter (Signed)
-----   Message from Claudina Lick, LPN sent at [id number]  5:26 PM EST ----- Routing to Dr.Leonetta Mcgivern

## 2016-09-18 NOTE — Transfer of Care (Signed)
Immediate Anesthesia Transfer of Care Note  Patient: Darren [name]  Procedure(s) Performed: Procedure(s) with comments: ESOPHAGOGASTRODUODENOSCOPY (EGD) WITH PROPOFOL (N/A) - 1200 BIOPSY - biopsy of distal esophagus  Patient Location: PACU  Anesthesia Type:MAC  Level of Consciousness: awake and patient cooperative  Airway & Oxygen Therapy: Patient Spontanous Breathing and Patient connected to nasal cannula oxygen  Post-op Assessment: Report given to RN and Post -op Vital signs reviewed and stable  Post vital signs: Reviewed and stable  Last Vitals:  Vitals:   09/18/16 1039  BP: 125/87  Pulse: (!) 55  Temp: 36.4 C    Last Pain:  Vitals:   09/18/16 1039  TempSrc: Oral      Patients Stated Pain Goal: 5 ([id number])  Complications: No apparent anesthesia complications

## 2016-09-18 NOTE — Anesthesia Preprocedure Evaluation (Signed)
Anesthesia Evaluation  Patient identified by MRN, date of birth, ID band Patient awake    Reviewed: Allergy & Precautions, H&P , NPO status , Patient's Chart, lab work & pertinent test results  Airway Mallampati: II  TM Distance: >3 FB Neck ROM: full    Dental  (+) Poor Dentition, Chipped, Dental Advisory Given   Pulmonary shortness of breath, asthma , COPD, former smoker,           Cardiovascular hypertension,      Neuro/Psych PSYCHIATRIC DISORDERS Depression    GI/Hepatic GERD  ,  Endo/Other  Hypothyroidism   Renal/GU      Musculoskeletal   Abdominal   Peds  Hematology   Anesthesia Other Findings   Reproductive/Obstetrics                             Anesthesia Physical  Anesthesia Plan  ASA: II  Anesthesia Plan: MAC   Post-op Pain Management:    Induction: Intravenous  Airway Management Planned:   Additional Equipment:   Intra-op Plan:   Post-operative Plan:   Informed Consent: I have reviewed the patients History and Physical, chart, labs and discussed the procedure including the risks, benefits and alternatives for the proposed anesthesia with the patient or authorized representative who has indicated his/her understanding and acceptance.     Plan Discussed with: Surgeon  Anesthesia Plan Comments:         Anesthesia Quick Evaluation

## 2016-09-18 NOTE — Discharge Instructions (Addendum)
Dysphagia Diet Level 2, Mechanically Altered The dysphagia level 2 diet includes foods that are blended, chopped, ground, or mashed so they are easier to chew and swallow. The foods are soft, moist, and can be chopped into -inch chunks (such as pancakes, pasta, and bananas). In order to be on this diet, you must be able to chew. This diet helps you transition between the pureed textures of the dysphagia level 1 diet to more solid textures. This diet is helpful for people with mild to moderate swallowing difficulties. It reduces the risk of food getting caught in the windpipe, trachea, or lungs. You may need help or supervision during meals while following this diet so that you eat safely. You will be on this diet until your health care provider advances the texture of your diet. What do I need to know about this diet? Foods  You may eat foods that are soft and moist.  You may need to use a blender, whisk, or masher to soften some of your foods.  You can moisten foods with gravies, sauces, vegetable or fruit juice, milk, half and half, or water when blending, mashing, or grinding your foods to the right consistency.  If you were on the dysphagia level 1 diet, you may still eat any of the foods included in that diet.  Avoid foods that are dry, hard, sticky, chewy, coarse, and crunchy. Also avoid large cuts of food.  Take small bites. Each bite should contain  inch or less of food. Liquids  Avoid liquids with seeds and chunks.  Thicken liquids, if instructed by your health care provider. Your health care provider will tell you the consistency to which you should thicken your liquids for safe swallowing. To thicken a liquid, use a commercial thickener or a thickening food (such as rice cereal or potato flakes). Ask your health care provider for specific recommendations on thickeners. See your dietitian or health care provider regularly for help with your dietary changes. What foods can I  eat? Grains  Store-bought soft breads that do not have nuts or seeds. Pancakes, sweet rolls, [place] pastries, and [place] toast that have been moistened with syrup or sauce to form a slurry when blended. Well-cooked pasta, noodles, and bread dressing. Well-cooked noodles and pasta in sauce. Moist macaroni and cheese. Soft dumplings or spaetzle with gravy or butter. Cooked cereals (including oatmeal). Low-texture dry cereals, such as rice puff, corn, or wheat-flake cereals, with milk (if thin liquids are not allowed, make sure all of the milk is absorbed by the cereal before eating it). Vegetables  Very soft, well-cooked vegetables in pieces less than  inch in size. Cooked potatoes that are moist, not crispy, and with sauce. Fruits  Canned or cooked fruits that are soft or moist and do not have skin or seeds. Fresh, soft bananas. Fruit juices with a small amount of pulp (if thin liquids are allowed). Gelatin or plain gelatin with canned fruit, except pineapple. Meat and Other Protein Sources  Tender, moist meats, poultry, or fish cooked with gravy or sauce and cubed to -inch bites or smaller. Ground meat. Moist meatball or meatloaf. Fish without bones. Moist casseroles without rice. Tuna, egg, or meat salad without chunks or hard-to-chew vegetables, such as celery and onions. Smooth quiche without large chunks. Scrambled, poached, or soft-cooked eggs with butter, margarine, sauce, or gravy. Tofu. Well-cooked, moistened and mashed beans, peas, baked beans, and other legumes. Casseroles without rice (such as tuna noodle casserole or soft moist meat lasagna). Dairy  Cream cheese. Yogurt. Cottage cheese. Ask your health care provider if milk is allowed. Sweets/Desserts  Pudding. Custard. Soft fruit pies with crust on the bottom only. Crisps and cobblers without seeds or nuts and with soft crusts. Soft, moist cakes. Icing. Pre-gelled cookies. Soft, moist cookies dunked in milk, coffee, or another liquid.  Jelly. Soft, smooth chocolate bars that are easily chewed. Jams and preserves without seeds. Ask your health care provider whether you can have frozen desserts. Fats and Oils  Butter. Margarine. Cream for cereal, depending on liquid consistency allowed. Gravy. Cream sauces. Mayonnaise. Salad dressings. Cream cheese. Cheese spreads, plain or with soft fruits or vegetables added. Sour cream. Sour cream dips with soft fruits or vegetables added. Whipped toppings. Other  Sauces and salsas that have soft chunks that are about  inch or smaller. The items listed above may not be a complete list of recommended foods or beverages. Contact your dietitian for more options.  What foods are not recommended? Grains  All breads not listed in the recommended list. Breads that are hard or have nuts or seeds. Coarse cereals. Cereals that have nuts, seeds, dried fruits, or coconut. Rice. Corn. Vegetables  Whole, raw, frozen, or dried vegetables. Tough, fibrous, chewy, or stringy cooked vegetables, such as celery, peas, broccoli, cabbage, Brussels sprouts, and asparagus. Potato skins. Potato and other vegetable chips. Fried or French-fried potatoes. Cooked corn and peas. Fruits  Whole raw, frozen, or dried fruits, including coconut. Pineapple. Fruits with seeds. Meat and Other Protein Sources  Dry, tough meats, such as bacon, sausage, and hot dogs. Cheese slices and cubes. Peanut butter. Hard boiled or fried eggs. Nuts. Seeds. Pizza. Sandwiches. Dry casseroles or casseroles with rice or large chunks. Dairy  Yogurt with nuts, seeds, or large chunks. Sweets/Desserts  Coarse, hard, chewy, or sticky desserts. Any dessert with nuts, seeds, coconut, pineapple, or dried fruit. Ask your health care provider whether you can have frozen desserts. Fats and Oils  Avoid fats with chunky, large textures, such as those with nuts or fruits. Other  Soups and casseroles with large chunks. The items listed above may not be a  complete list of foods and beverages to avoid. Contact your dietitian for more information.  This information is not intended to replace advice given to you by your health care provider. Make sure you discuss any questions you have with your health care provider. Document Released: 09/14/2005 Document Revised: 02/20/2016 Document Reviewed: 08/28/2013 Elsevier Interactive Patient Education  2017 [place].  Gastroesophageal Reflux Disease, Adult Introduction Normally, food travels down the esophagus and stays in the stomach to be digested. If a person has gastroesophageal reflux disease (GERD), food and stomach acid move back up into the esophagus. When this happens, the esophagus becomes sore and swollen (inflamed). Over time, GERD can make small holes (ulcers) in the lining of the esophagus. Follow these instructions at home: Diet Follow a diet as told by your doctor. You may need to avoid foods and drinks such as: Coffee and tea (with or without caffeine). Drinks that contain alcohol. Energy drinks and sports drinks. Carbonated drinks or sodas. Chocolate and cocoa. Peppermint and mint flavorings. Garlic and onions. Horseradish. Spicy and acidic foods, such as peppers, chili powder, curry powder, vinegar, hot sauces, and BBQ sauce. Citrus fruit juices and citrus fruits, such as oranges, lemons, and limes. Tomato-based foods, such as red sauce, chili, salsa, and pizza with red sauce. Fried and fatty foods, such as donuts, french fries, potato chips, and high-fat dressings.  High-fat meats, such as hot dogs, rib eye steak, sausage, ham, and bacon. High-fat dairy items, such as whole milk, butter, and cream cheese. Eat small meals often. Avoid eating large meals. Avoid drinking large amounts of liquid with your meals. Avoid eating meals during the 2-3 hours before bedtime. Avoid lying down right after you eat. Do not exercise right after you eat. General instructions Pay attention to  any changes in your symptoms. Take over-the-counter and prescription medicines only as told by your doctor. Do not take aspirin, ibuprofen, or other NSAIDs unless your doctor says it is okay. Do not use any tobacco products, including cigarettes, chewing tobacco, and e-cigarettes. If you need help quitting, ask your doctor. Wear loose clothes. Do not wear anything tight around your waist. Raise (elevate) the head of your bed about 6 inches (15 cm). Try to lower your stress. If you need help doing this, ask your doctor. If you are overweight, lose an amount of weight that is healthy for you. Ask your doctor about a safe weight loss goal. Keep all follow-up visits as told by your doctor. This is important. Contact a doctor if: You have new symptoms. You lose weight and you do not know why it is happening. You have trouble swallowing, or it hurts to swallow. You have wheezing or a cough that keeps happening. Your symptoms do not get better with treatment. You have a hoarse voice. Get help right away if: You have pain in your arms, neck, jaw, teeth, or back. You feel sweaty, dizzy, or light-headed. You have chest pain or shortness of breath. You throw up (vomit) and your throw up looks like blood or coffee grounds. You pass out (faint). Your poop (stool) is bloody or black. You cannot swallow, drink, or eat. This information is not intended to replace advice given to you by your health care provider. Make sure you discuss any questions you have with your health care provider. Document Released: 03/02/2008 Document Revised: 02/20/2016 Document Reviewed: 01/09/2015  2017 Elsevier  Food Choices for Gastroesophageal Reflux Disease, Adult When you have gastroesophageal reflux disease (GERD), the foods you eat and your eating habits are very important. Choosing the right foods can help ease your discomfort. What guidelines do I need to follow?  Choose fruits, vegetables, whole grains, and  low-fat dairy products.  Choose low-fat meat, fish, and poultry.  Limit fats such as oils, salad dressings, butter, nuts, and avocado.  Keep a food diary. This helps you identify foods that cause symptoms.  Avoid foods that cause symptoms. These may be different for everyone.  Eat small meals often instead of 3 large meals a day.  Eat your meals slowly, in a place where you are relaxed.  Limit fried foods.  Cook foods using methods other than frying.  Avoid drinking alcohol.  Avoid drinking large amounts of liquids with your meals.  Avoid bending over or lying down until 2-3 hours after eating. What foods are not recommended? These are some foods and drinks that may make your symptoms worse: Vegetables  Tomatoes. Tomato juice. Tomato and spaghetti sauce. Chili peppers. Onion and garlic. Horseradish. Fruits  Oranges, grapefruit, and lemon (fruit and juice). Meats  High-fat meats, fish, and poultry. This includes hot dogs, ribs, ham, sausage, salami, and bacon. Dairy  Whole milk and chocolate milk. Sour cream. Cream. Butter. Ice cream. Cream cheese. Drinks  Coffee and tea. Bubbly (carbonated) drinks or energy drinks. Condiments  Hot sauce. Barbecue sauce. Sweets/Desserts  Chocolate and cocoa.  Donuts. Peppermint and spearmint. Fats and Oils  High-fat foods. This includes [place] fries and potato chips. Other  Vinegar. Strong spices. This includes black pepper, white pepper, red pepper, cayenne, curry powder, cloves, ginger, and chili powder. The items listed above may not be a complete list of foods and drinks to avoid. Contact your dietitian for more information.  This information is not intended to replace advice given to you by your health care provider. Make sure you discuss any questions you have with your health care provider. Document Released: 03/15/2012 Document Revised: 02/20/2016 Document Reviewed: 07/19/2013 Elsevier Interactive Patient Education  2017 [place]. EGD Discharge instructions Please read the instructions outlined below and refer to this sheet in the next few weeks. These discharge instructions provide you with general information on caring for yourself after you leave the hospital. Your doctor may also give you specific instructions. While your treatment has been planned according to the most current medical practices available, unavoidable complications occasionally occur. If you have any problems or questions after discharge, please call your doctor. ACTIVITY  You may resume your regular activity but move at a slower pace for the next 24 hours.   Take frequent rest periods for the next 24 hours.   Walking will help expel (get rid of) the air and reduce the bloated feeling in your abdomen.   No driving for 24 hours (because of the anesthesia (medicine) used during the test).   You may shower.   Do not sign any important legal documents or operate any machinery for 24 hours (because of the anesthesia used during the test).  NUTRITION  Drink plenty of fluids.   You may resume your normal diet.   Begin with a light meal and progress to your normal diet.   Avoid alcoholic beverages for 24 hours or as instructed by your caregiver.  MEDICATIONS  You may resume your normal medications unless your caregiver tells you otherwise.  WHAT YOU CAN EXPECT TODAY  You may experience abdominal discomfort such as a feeling of fullness or gas pains.  FOLLOW-UP  Your doctor will discuss the results of your test with you.  SEEK IMMEDIATE MEDICAL ATTENTION IF ANY OF THE FOLLOWING OCCUR:  Excessive nausea (feeling sick to your stomach) and/or vomiting.   Severe abdominal pain and distention (swelling).   Trouble swallowing.   Temperature over 101 F (37.8 C).   Rectal bleeding or vomiting of blood.    Dysphagia 2 diet-information provided  GERD information provided  Continue pantoprazole 40 mg daily  My office will  schedule an endoscopic ultrasound with Dr. Ardis Hughs in January to further evaluate your abnormal esophagus  Further recommendations to follow pending review of pathology report

## 2016-09-18 NOTE — H&P (View-Only) (Signed)
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  Please set up referral.   Per RMR, send copy of labs and EKG to PCP and please touch base with PCP nurse to let them know there is an abnormal EKG that needs to be addressed.

## 2016-09-18 NOTE — Anesthesia Procedure Notes (Signed)
Procedure Name: MAC Date/Time: 09/18/2016 10:27 AM Performed by: Vista Deck Pre-anesthesia Checklist: Patient identified, Emergency Drugs available, Suction available, Timeout performed and Patient being monitored Patient Re-evaluated:Patient Re-evaluated prior to inductionOxygen Delivery Method: Non-rebreather mask

## 2016-09-22 ENCOUNTER — Encounter: Payer: Self-pay | Admitting: Physical Therapy

## 2016-09-22 ENCOUNTER — Ambulatory Visit: Payer: Self-pay | Admitting: Physical Therapy

## 2016-09-22 DIAGNOSIS — M545 Low back pain, unspecified: Secondary | ICD-10-CM

## 2016-09-22 DIAGNOSIS — M62830 Muscle spasm of back: Secondary | ICD-10-CM

## 2016-09-22 NOTE — Therapy (Signed)
[facility] [employer] [place], [place], [address] Phone: [phone]   Fax:  [phone]  Physical Therapy Treatment  Patient Details  Name: Darren [name] MRN: [id number] Date of Birth: [birth year] Referring Provider: Lenna Sciara. Funches  Encounter Date: 09/22/2016      PT End of Session - 09/22/16 [id number]    Visit Number 2   Date for PT Re-Evaluation 11/18/16   PT Start Time 0845   PT Stop Time 0940   PT Time Calculation (min) 55 min   Activity Tolerance Patient limited by pain   Behavior During Therapy [facility] for tasks assessed/performed      Past Medical History:  Diagnosis Date  . Angina    PATIENT STATES HE HAD EPISODE OF PAIN IN CHEST WHEN PULLING AND STRAINING AT WORK  . Arthritis    per Medical clearance form.  . Ascending aortic aneurysm ([place]) 06/2016   4.2 cm  . Barrett esophagus 2001   path report in EPIC 2001 c/w Barrett's. endoscopy report unavailable.  Marland Kitchen COPD (chronic obstructive pulmonary disease) ([place])    per medical clearance form.  . Depression    per Medical Clearance form(09/18/11)  . Frequency of urination   . GERD (gastroesophageal reflux disease)    per Medical clearance form.  . Hypertension    Per Medical Clearance form.  . Hypothyroidism    per Medical Clearance form.  . Shortness of breath    WITH EXERTION     Past Surgical History:  Procedure Laterality Date  . ESOPHAGOGASTRODUODENOSCOPY  2001   barrett's per path  . [place]  . MULTIPLE EXTRACTIONS WITH ALVEOLOPLASTY  11/30/2011   Procedure: MULTIPLE EXTRACION WITH ALVEOLOPLASTY;  Surgeon: Gae Bon, DDS;  Location: [facility];  Service: Oral Surgery;  Laterality: Bilateral;    There were no vitals filed for this visit.      Subjective Assessment - 09/22/16 0843    Subjective "My lower back is hurting"   Currently in Pain? Yes   Pain Score 9    Pain Location Back   Pain Orientation Mid;Lower                          [facility] Adult PT Treatment/Exercise - 09/22/16 0001      Exercises   Exercises Lumbar     Lumbar Exercises: Stretches   Passive Hamstring Stretch 3 reps;10 seconds   Lower Trunk Rotation 3 reps;10 seconds     Lumbar Exercises: Aerobic   Stationary Bike NuStep L 5 x6 min      Lumbar Exercises: Machines for Strengthening   Cybex Knee Extension 5lb 2x10   Cybex Knee Flexion 20lb 2x10   Other Lumbar Machine Exercise Rows and Lats 25lb 2x10     Lumbar Exercises: Standing   Row 10 reps;Theraband  x2    Theraband Level (Row) Level 3 (Green)     Lumbar Exercises: Supine   Bridge 10 reps;2 seconds  x2     Modalities   Modalities Electrical Stimulation;Moist Heat     Moist Heat Therapy   Number Minutes Moist Heat 15 Minutes   Moist Heat Location Lumbar Spine     Electrical Stimulation   Electrical Stimulation Location lumbar    Electrical Stimulation Action IFC   Electrical Stimulation Parameters supine   Electrical Stimulation Goals Pain  PT Education - 09/22/16 [phone]    Education provided Yes   Education Details HEP, Tband Rows, supinr bridges, lower trunk rotations.   Person(s) Educated Patient   Methods Explanation;Demonstration;Tactile cues;Handout   Comprehension Verbalized understanding          PT Short Term Goals - 09/17/16 1505      PT SHORT TERM GOAL #1   Title independent with initial HEP   Time 2   Period Weeks   Status New           PT Long Term Goals - 09/17/16 1506      PT LONG TERM GOAL #1   Title understand posture and body mechanics   Time 8   Period Weeks   Status New     PT LONG TERM GOAL #2   Title increase lumbar ROM 25%   Time 8   Period Weeks   Status New     PT LONG TERM GOAL #3   Title decrease pain 50%   Time 8   Period Weeks   Status New     PT LONG TERM GOAL #4   Title report able to walk and do housework   Time 8   Period Weeks   Status New                Plan - 09/22/16 [id number]    Clinical Impression Statement Pt able to complete all exercises despite reports of LBP. Pt unable to reach full TKE with seated knee extensions. Pt with a slight increase in pain with seated leg curls.    Rehab Potential Good   PT Frequency 2x / week   PT Duration 8 weeks   PT Treatment/Interventions ADLs/Self Care Home Management;Electrical Stimulation;Moist Heat;Traction;Ultrasound;Therapeutic activities;Therapeutic exercise;Patient/family education;Manual techniques   PT Next Visit Plan add exercises and HEP      Patient will benefit from skilled therapeutic intervention in order to improve the following deficits and impairments:  Decreased strength, Decreased range of motion, Difficulty walking, Increased muscle spasms, Impaired flexibility, Improper body mechanics, Pain  Visit Diagnosis: Acute bilateral low back pain without sciatica  Muscle spasm of back     Problem List Patient Active Problem List   Diagnosis Date Noted  . Nonspecific abnormal electrocardiogram (ECG) (EKG) 09/18/2016  . Abnormal CT scan, esophagus 08/24/2016  . Acute left-sided low back pain without sciatica 08/18/2016  . COPD (chronic obstructive pulmonary disease) with emphysema ([place]) 07/31/2016  . HTN (hypertension) 07/31/2016  . 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 Odynophagia 07/09/2016  . Esophageal dysphagia 07/09/2016  . Chronic midline low back pain 07/09/2016  . Vitamin D deficiency 04/06/2014  . Hypothyroidism 10/31/2013  . GERD (gastroesophageal reflux disease) 11/25/2011  . Barrett esophagus 09/29/1999    Scot Jun, PTA 09/22/2016, 9:29 AM  [place], [place], [address] Phone: [phone]   Fax:  [phone]  Name: Darren [name] MRN: [id number] Date of Birth: 10-21-1960

## 2016-09-23 ENCOUNTER — Telehealth: Payer: Self-pay | Admitting: Family Medicine

## 2016-09-23 MED FILL — ?LEVOTHYROXINE 175 MCG TABL: 175 | 30 days supply | Qty: 30 | Fill #2

## 2016-09-23 NOTE — Progress Notes (Signed)
Pt was called and ECHO appointment has been made.

## 2016-09-23 NOTE — Telephone Encounter (Signed)
Patient needs clarification on why he needs to get Electrocardiogram...please follow up

## 2016-09-24 ENCOUNTER — Ambulatory Visit: Payer: Self-pay | Admitting: Physical Therapy

## 2016-09-24 ENCOUNTER — Encounter: Payer: Self-pay | Admitting: Physical Therapy

## 2016-09-24 DIAGNOSIS — M545 Low back pain, unspecified: Secondary | ICD-10-CM

## 2016-09-24 DIAGNOSIS — M62830 Muscle spasm of back: Secondary | ICD-10-CM

## 2016-09-24 NOTE — Therapy (Signed)
[place], [place], [address] Phone: [phone]   Fax:  [phone]  Physical Therapy Treatment  Patient Details  Name: Darren [name] MRN: [id number] Date of Birth: 12-07-1960 Referring Provider: Lenna Sciara. Funches  Encounter Date: 09/24/2016      PT End of Session - 09/24/16 0923    Visit Number 3   Date for PT Re-Evaluation 11/18/16   PT Start Time 0845   PT Stop Time 0935   PT Time Calculation (min) 50 min   Activity Tolerance Patient limited by pain   Behavior During Therapy [facility] for tasks assessed/performed      Past Medical History:  Diagnosis Date  . Angina    PATIENT STATES HE HAD EPISODE OF PAIN IN CHEST WHEN PULLING AND STRAINING AT WORK  . Arthritis    per Medical clearance form.  . Ascending aortic aneurysm ([place]) 06/2016   4.2 cm  . Barrett esophagus 2001   path report in EPIC 2001 c/w Barrett's. endoscopy report unavailable.  Marland Kitchen COPD (chronic obstructive pulmonary disease) ([place])    per medical clearance form.  . Depression    per Medical Clearance form(09/18/11)  . Frequency of urination   . GERD (gastroesophageal reflux disease)    per Medical clearance form.  . Hypertension    Per Medical Clearance form.  . Hypothyroidism    per Medical Clearance form.  . Shortness of breath    WITH EXERTION     Past Surgical History:  Procedure Laterality Date  . ESOPHAGOGASTRODUODENOSCOPY  2001   barrett's per path  . [place]  . MULTIPLE EXTRACTIONS WITH ALVEOLOPLASTY  11/30/2011   Procedure: MULTIPLE EXTRACION WITH ALVEOLOPLASTY;  Surgeon: Gae Bon, DDS;  Location: [facility];  Service: Oral Surgery;  Laterality: Bilateral;    There were no vitals filed for this visit.      Subjective Assessment - 09/24/16 0854    Subjective Pt reports he is not doing very good. He states he is very sore today and reports that the home exercises make his back hurt.    Currently in Pain? Yes   Pain Score 9    Pain Location Back   Pain Orientation Lower                         [facility] Adult PT Treatment/Exercise - 09/24/16 0001      Lumbar Exercises: Aerobic   Stationary Bike NuStep 6 min, lvl 6     Lumbar Exercises: Supine   Other Supine Lumbar Exercises ball squeezes hip adduction 2x10   Other Supine Lumbar Exercises TA activation, x10 hoild 3 secs     Lumbar Exercises: Prone   Other Prone Lumbar Exercises prone on elbows 2 minutes     Modalities   Modalities Electrical Stimulation;Moist Heat     Moist Heat Therapy   Number Minutes Moist Heat 15 Minutes   Moist Heat Location Lumbar Spine     Electrical Stimulation   Electrical Stimulation Location lumbar   Electrical Stimulation Action IFC   Electrical Stimulation Parameters supine   Electrical Stimulation Goals Pain     Manual Therapy   Manual Therapy Passive ROM   Passive ROM HS stretch, SKC stretch                PT Education - 09/24/16 [phone]    Education provided Yes   Education Details  Prone on elbows, HS stretch          PT Short Term Goals - 09/17/16 1505      PT SHORT TERM GOAL #1   Title independent with initial HEP   Time 2   Period Weeks   Status New           PT Long Term Goals - 09/17/16 1506      PT LONG TERM GOAL #1   Title understand posture and body mechanics   Time 8   Period Weeks   Status New     PT LONG TERM GOAL #2   Title increase lumbar ROM 25%   Time 8   Period Weeks   Status New     PT LONG TERM GOAL #3   Title decrease pain 50%   Time 8   Period Weeks   Status New     PT LONG TERM GOAL #4   Title report able to walk and do housework   Time 8   Period Weeks   Status New               Plan - 09/24/16 [id number]    Clinical Impression Statement Pt tolerated treatment fair due to pain provocation with most exercises. Pt did however report some symptom relief with prone on elbows exercise which may  imply posteriorly slipped disc. Progress per pt tolerance.   Rehab Potential Good   PT Frequency 2x / week   PT Duration 8 weeks   PT Treatment/Interventions ADLs/Self Care Home Management;Electrical Stimulation;Moist Heat;Traction;Ultrasound;Therapeutic activities;Therapeutic exercise;Patient/family education;Manual techniques   PT Next Visit Plan Core stability, Pelvic tilt, prone on elbows   PT Home Exercise Plan LE stretches, prone on elbows   Consulted and Agree with Plan of Care Patient      Patient will benefit from skilled therapeutic intervention in order to improve the following deficits and impairments:  Decreased strength, Decreased range of motion, Difficulty walking, Increased muscle spasms, Impaired flexibility, Improper body mechanics, Pain  Visit Diagnosis: Acute bilateral low back pain without sciatica  Muscle spasm of back     Problem List Patient Active Problem List   Diagnosis Date Noted  . Nonspecific abnormal electrocardiogram (ECG) (EKG) 09/18/2016  . Abnormal CT scan, esophagus 08/24/2016  . Acute left-sided low back pain without sciatica 08/18/2016  . COPD (chronic obstructive pulmonary disease) with emphysema ([place]) 07/31/2016  . HTN (hypertension) 07/31/2016  . 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 Odynophagia 07/09/2016  . Esophageal dysphagia 07/09/2016  . Chronic midline low back pain 07/09/2016  . Vitamin D deficiency 04/06/2014  . Hypothyroidism 10/31/2013  . GERD (gastroesophageal reflux disease) 11/25/2011  . Barrett esophagus 09/29/1999    Renford Dills Ward, SPT 09/24/2016, 9:43 AM  [place], [place], [address] Phone: [phone]   Fax:  [phone]  Name: Darren [name] MRN: [id number] Date of Birth: March 31, 1961

## 2016-09-24 NOTE — Telephone Encounter (Signed)
Because his EKG done prior to his EGD was abnormal Concerning for possible coronary artery disease and heart pumping dysfunction

## 2016-09-26 ENCOUNTER — Encounter: Payer: Self-pay | Admitting: Internal Medicine

## 2016-09-28 HISTORY — PX: OTHER SURGICAL HISTORY: SHX169

## 2016-09-28 HISTORY — PX: TRANSTHORACIC ECHOCARDIOGRAM: SHX275

## 2016-09-29 ENCOUNTER — Encounter (HOSPITAL_COMMUNITY): Payer: Self-pay | Admitting: Internal Medicine

## 2016-09-29 ENCOUNTER — Ambulatory Visit: Payer: Self-pay | Attending: Family Medicine | Admitting: Physical Therapy

## 2016-09-29 ENCOUNTER — Telehealth: Payer: Self-pay

## 2016-09-29 DIAGNOSIS — M545 Low back pain, unspecified: Secondary | ICD-10-CM

## 2016-09-29 DIAGNOSIS — M62830 Muscle spasm of back: Secondary | ICD-10-CM | POA: Insufficient documentation

## 2016-09-29 NOTE — Telephone Encounter (Signed)
Per RMR- Send letter to patient.  Send copy of letter with path to referring provider and PCP.   EUS with Dr. Ardis Hughs should be in the works

## 2016-09-29 NOTE — Telephone Encounter (Signed)
Called to see about the EUS and he has been discharged from there

## 2016-09-29 NOTE — Therapy (Signed)
[place], [place], [address] Phone: [phone]   Fax:  [phone]  Physical Therapy Treatment  Patient Details  Name: Darren [name] MRN: [id number] Date of Birth: [birth year] Referring Provider: Lenna Sciara. Funches  Encounter Date: 09/29/2016      PT End of Session - 09/29/16 1337    Visit Number 4   Date for PT Re-Evaluation 11/18/16   PT Start Time [id number]   PT Stop Time 1340   PT Time Calculation (min) 35 min      Past Medical History:  Diagnosis Date  . Angina    PATIENT STATES HE HAD EPISODE OF PAIN IN CHEST WHEN PULLING AND STRAINING AT WORK  . Arthritis    per Medical clearance form.  . Ascending aortic aneurysm ([place]) 06/2016   4.2 cm  . Barrett esophagus 2001   path report in EPIC 2001 c/w Barrett's. endoscopy report unavailable.  Marland Kitchen COPD (chronic obstructive pulmonary disease) ([place])    per medical clearance form.  . Depression    per Medical Clearance form(09/18/11)  . Frequency of urination   . GERD (gastroesophageal reflux disease)    per Medical clearance form.  . Hypertension    Per Medical Clearance form.  . Hypothyroidism    per Medical Clearance form.  . Shortness of breath    WITH EXERTION     Past Surgical History:  Procedure Laterality Date  . BIOPSY  09/18/2016   Procedure: BIOPSY;  Surgeon: Daneil Dolin, MD;  [facility];  Service: Endoscopy;;  biopsy of distal esophagus  . ESOPHAGOGASTRODUODENOSCOPY  2001   barrett's per path  . ESOPHAGOGASTRODUODENOSCOPY (EGD) WITH PROPOFOL N/A 09/18/2016   Procedure: ESOPHAGOGASTRODUODENOSCOPY (EGD) WITH PROPOFOL;  Surgeon: Daneil Dolin, MD;  [facility];  Service: Endoscopy;  Laterality: N/A;  1200  . [place]  . MULTIPLE EXTRACTIONS WITH ALVEOLOPLASTY  11/30/2011   Procedure: MULTIPLE EXTRACION WITH ALVEOLOPLASTY;  Surgeon: Gae Bon, DDS;  Location: [facility];  Service: Oral  Surgery;  Laterality: Bilateral;    There were no vitals filed for this visit.      Subjective Assessment - 09/29/16 1310    Subjective nothing has really helped except maybe leaning back with towel.    Currently in Pain? Yes   Pain Score 9    Pain Location Back                         [facility] Adult PT Treatment/Exercise - 09/29/16 0001      Modalities   Modalities Traction     Moist Heat Therapy   Number Minutes Moist Heat 15 Minutes   Moist Heat Location Lumbar Spine     Traction   Type of Traction Lumbar   Max (lbs) 50   Hold Time 15                PT Education - 09/29/16 1336    Education provided Yes   Education Details prone on elbows, REIS and prone push up   Person(s) Educated Patient   Methods Demonstration;Explanation;Tactile cues;Handout   Comprehension Verbalized understanding;Returned demonstration          PT Short Term Goals - 09/17/16 1505      PT SHORT TERM GOAL #1   Title independent with initial HEP   Time 2   Period Weeks   Status New  PT Long Term Goals - 09/17/16 1506      PT LONG TERM GOAL #1   Title understand posture and body mechanics   Time 8   Period Weeks   Status New     PT LONG TERM GOAL #2   Title increase lumbar ROM 25%   Time 8   Period Weeks   Status New     PT LONG TERM GOAL #3   Title decrease pain 50%   Time 8   Period Weeks   Status New     PT LONG TERM GOAL #4   Title report able to walk and do housework   Time 8   Period Weeks   Status New               Plan - 09/29/16 1337    Clinical Impression Statement pt reports a 20% decrease in pain with traction and ext activities. issued handout and asked pt to be diligent over next 2 days with ext 6-8 times per day since this appears to be only thing that reduces pain.   PT Next Visit Plan assess and progress      Patient will benefit from skilled therapeutic intervention in order to improve the following  deficits and impairments:  Decreased strength, Decreased range of motion, Difficulty walking, Increased muscle spasms, Impaired flexibility, Improper body mechanics, Pain  Visit Diagnosis: Acute bilateral low back pain without sciatica  Muscle spasm of back     Problem List Patient Active Problem List   Diagnosis Date Noted  . Nonspecific abnormal electrocardiogram (ECG) (EKG) 09/18/2016  . Abnormal CT scan, esophagus 08/24/2016  . Acute left-sided low back pain without sciatica 08/18/2016  . COPD (chronic obstructive pulmonary disease) with emphysema ([place]) 07/31/2016  . HTN (hypertension) 07/31/2016  . 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 Odynophagia 07/09/2016  . Esophageal dysphagia 07/09/2016  . Chronic midline low back pain 07/09/2016  . Vitamin D deficiency 04/06/2014  . Hypothyroidism 10/31/2013  . GERD (gastroesophageal reflux disease) 11/25/2011  . Barrett esophagus 09/29/1999    Fleeta Kunde,ANGIE PTA 09/29/2016, 1:40 PM  [place], [place], [address] Phone: [phone]   Fax:  [phone]  Name: Darren [name] MRN: [id number] Date of Birth: 1961/01/09

## 2016-09-29 NOTE — Telephone Encounter (Signed)
Let's redirect towards [facility]

## 2016-09-29 NOTE — Telephone Encounter (Signed)
Routing to [place].  

## 2016-09-29 NOTE — Telephone Encounter (Signed)
Letter mailed to the pt. Please check on EUS.

## 2016-09-30 ENCOUNTER — Ambulatory Visit (HOSPITAL_COMMUNITY)
Admission: RE | Admit: 2016-09-30 | Discharge: 2016-09-30 | Disposition: A | Discharge status: Home / Self Care | Payer: Self-pay | Source: Ambulatory Visit | Attending: Family Medicine | Admitting: Family Medicine

## 2016-09-30 ENCOUNTER — Other Ambulatory Visit: Payer: Self-pay

## 2016-09-30 DIAGNOSIS — I119 Hypertensive heart disease without heart failure: Secondary | ICD-10-CM | POA: Insufficient documentation

## 2016-09-30 DIAGNOSIS — R9431 Abnormal electrocardiogram [ECG] [EKG]: Secondary | ICD-10-CM | POA: Insufficient documentation

## 2016-09-30 DIAGNOSIS — J449 Chronic obstructive pulmonary disease, unspecified: Secondary | ICD-10-CM | POA: Insufficient documentation

## 2016-09-30 DIAGNOSIS — K22719 Barrett's esophagus with dysplasia, unspecified: Secondary | ICD-10-CM

## 2016-09-30 NOTE — Progress Notes (Signed)
  Echocardiogram 2D Echocardiogram has been performed.  Diamond Nickel 09/30/2016, 4:01 PM

## 2016-09-30 NOTE — Telephone Encounter (Signed)
Referral to [facility] has been made

## 2016-10-01 ENCOUNTER — Ambulatory Visit: Payer: Self-pay | Admitting: Physical Therapy

## 2016-10-01 ENCOUNTER — Telehealth: Payer: Self-pay

## 2016-10-01 ENCOUNTER — Encounter: Payer: Self-pay | Admitting: Physical Therapy

## 2016-10-01 DIAGNOSIS — M545 Low back pain, unspecified: Secondary | ICD-10-CM

## 2016-10-01 DIAGNOSIS — M62830 Muscle spasm of back: Secondary | ICD-10-CM

## 2016-10-01 NOTE — Therapy (Addendum)
[facility] [address] [place], [place], [address] Phone: [phone]   Fax:  [phone]  Physical Therapy Treatment  Patient Details  Name: Darren [name] MRN: [id number] Date of Birth: 10/27/1960 Referring Provider: J. Funches  Encounter Date: 10/01/2016      PT End of Session - 10/01/16 0953    Visit Number 5   Date for PT Re-Evaluation 11/18/16   PT Start Time 0915   PT Stop Time 1000   PT Time Calculation (min) 45 min   Activity Tolerance Patient tolerated treatment well;Patient limited by pain   Behavior During Therapy WFL for tasks assessed/performed      Past Medical History:  Diagnosis Date  . Angina    PATIENT STATES HE HAD EPISODE OF PAIN IN CHEST WHEN PULLING AND STRAINING AT WORK  . Arthritis    per Medical clearance form.  . Ascending aortic aneurysm ([facility]) 06/2016   4.2 cm  . Barrett esophagus 2001   path report in EPIC 2001 c/w Barrett's. endoscopy report unavailable.  . COPD (chronic obstructive pulmonary disease) ([facility])    per medical clearance form.  . Depression    per Medical Clearance form(09/18/11)  . Frequency of urination   . GERD (gastroesophageal reflux disease)    per Medical clearance form.  . Hypertension    Per Medical Clearance form.  . Hypothyroidism    per Medical Clearance form.  . Shortness of breath    WITH EXERTION     Past Surgical History:  Procedure Laterality Date  . BIOPSY  09/18/2016   Procedure: BIOPSY;  Surgeon: Robert M Rourk, MD;  [facility];  Service: Endoscopy;;  biopsy of distal esophagus  . ESOPHAGOGASTRODUODENOSCOPY  2001   barrett's per path  . ESOPHAGOGASTRODUODENOSCOPY (EGD) WITH PROPOFOL N/A 09/18/2016   Procedure: ESOPHAGOGASTRODUODENOSCOPY (EGD) WITH PROPOFOL;  Surgeon: Robert M Rourk, MD;  [facility];  Service: Endoscopy;  Laterality: N/A;  1200  . HEMORRHOID SURGERY     1982  . MULTIPLE EXTRACTIONS WITH  ALVEOLOPLASTY  11/30/2011   Procedure: MULTIPLE EXTRACION WITH ALVEOLOPLASTY;  Surgeon: Scott M Jensen, DDS;  [facility];  Service: Oral Surgery;  Laterality: Bilateral;    There were no vitals filed for this visit.      Subjective Assessment - 10/01/16 0918    Subjective Pt states he is sore today however he does report that the extension exercises seem to help. He reports that the mornings are always worse and the pain decreases as the day progresses.   Currently in Pain? Yes   Pain Score 9                          [facility] Adult PT Treatment/Exercise - 10/01/16 0001      Lumbar Exercises: Aerobic   Stationary Bike NuStep 6 min lvl 6     Lumbar Exercises: Standing   Row Strengthening;Both;10 reps;Theraband   Theraband Level (Row) Level 2 (Red)   Other Standing Lumbar Exercises hip extension standing 2x10 each leg   Other Standing Lumbar Exercises lumbar extension standing on wall     Lumbar Exercises: Prone   Other Prone Lumbar Exercises prone on elbows, 4x30 sec holds     Modalities   Modalities Moist Heat     Moist Heat Therapy   Number Minutes Moist Heat 15 Minutes   Moist Heat Location Lumbar Spine       Manual Therapy   Manual Therapy Muscle Energy Technique;Passive ROM   Passive ROM HS stretch   Muscle Energy Technique back extension muscle energy in seated, hip extension in supine                PT Education - 10/01/16 0953    Education provided No          PT Short Term Goals - 09/17/16 1505      PT SHORT TERM GOAL #1   Title independent with initial HEP   Time 2   Period Weeks   Status New           PT Long Term Goals - 09/17/16 1506      PT LONG TERM GOAL #1   Title understand posture and body mechanics   Time 8   Period Weeks   Status New     PT LONG TERM GOAL #2   Title increase lumbar ROM 25%   Time 8   Period Weeks   Status New     PT LONG TERM GOAL #3   Title decrease pain 50%   Time 8   Period  Weeks   Status New     PT LONG TERM GOAL #4   Title report able to walk and do housework   Time 8   Period Weeks   Status New               Plan - 10/01/16 0953    Clinical Impression Statement Pt tolerated treatment fair. He was able to complete all extension exercises however hip extension increased his symptoms. Pt reports decreased pain with lumbar extension muscle energy technique. Pt reports a decrease of pain to 8.5 at the end of treatment.    Rehab Potential Fair   PT Frequency 2x / week   PT Duration 8 weeks   PT Treatment/Interventions ADLs/Self Care Home Management;Electrical Stimulation;Moist Heat;Traction;Ultrasound;Therapeutic activities;Therapeutic exercise;Patient/family education;Manual techniques   PT Next Visit Plan assess and progress   Consulted and Agree with Plan of Care Patient      Patient will benefit from skilled therapeutic intervention in order to improve the following deficits and impairments:  Decreased strength, Decreased range of motion, Difficulty walking, Increased muscle spasms, Impaired flexibility, Improper body mechanics, Pain  Visit Diagnosis: Acute bilateral low back pain without sciatica  Muscle spasm of back     Problem List Patient Active Problem List   Diagnosis Date Noted  . Nonspecific abnormal electrocardiogram (ECG) (EKG) 09/18/2016  . Abnormal CT scan, esophagus 08/24/2016  . Acute left-sided low back pain without sciatica 08/18/2016  . COPD (chronic obstructive pulmonary disease) with emphysema ([facility]) 07/31/2016  . HTN (hypertension) 07/31/2016  . Dilation of thoracic aorta ([facility]) 07/31/2016  . Odynophagia 07/09/2016  . Esophageal dysphagia 07/09/2016  . Chronic midline low back pain 07/09/2016  . Vitamin D deficiency 04/06/2014  . Hypothyroidism 10/31/2013  . GERD (gastroesophageal reflux disease) 11/25/2011  . Barrett esophagus 09/29/1999    Brian Cade Isley, SPT 10/01/2016, 9:55 AM PHYSICAL THERAPY DISCHARGE  SUMMARY  Angie Payseur PTA Plan: Patient agrees to discharge.  Patient goals were not met. Patient is being discharged due to not returning since the last visit.  ?????      [facility] [address] [place], [place], [address] Phone: [phone]   Fax:  [phone]  Name: Rondo R Koerner MRN: [id number] Date of Birth: 06/11/1961    

## 2016-10-01 NOTE — Telephone Encounter (Signed)
Pt was called and informed of ECHO results.

## 2016-10-02 ENCOUNTER — Telehealth: Payer: Self-pay | Admitting: Family Medicine

## 2016-10-02 DIAGNOSIS — J069 Acute upper respiratory infection, unspecified: Secondary | ICD-10-CM

## 2016-10-02 NOTE — Telephone Encounter (Signed)
Pt calling requesting advice on what medication he can take for current symptoms  Pt is experiencing a deep cough that gets worse at night  Pt states cough has been going on for about a week

## 2016-10-05 MED ORDER — AMOXICILLIN 500 MG PO CAPS: 500.0000 mg | ORAL_CAPSULE | Freq: Three times a day (TID) | ORAL | 0 refills | Status: DC

## 2016-10-05 NOTE — Telephone Encounter (Signed)
Patient advised to take robitussin for cough I have ordered amoxicillin which is an antibiotic for upper respiratory infection

## 2016-10-05 NOTE — Telephone Encounter (Signed)
Will route to pcp 

## 2016-10-06 ENCOUNTER — Ambulatory Visit: Payer: Self-pay | Admitting: Physical Therapy

## 2016-10-06 MED FILL — ?AMOXICILLIN 500 MG CAPSULE: 500 | 10 days supply | Qty: 30 | Fill #0

## 2016-10-07 ENCOUNTER — Encounter: Payer: Self-pay | Admitting: Internal Medicine

## 2016-10-07 NOTE — Progress Notes (Signed)
Stacey, please schedule ov.  

## 2016-10-07 NOTE — Progress Notes (Signed)
Patient will need further work up of abnormal LFTs, specifically ALT is mildly elevated. HCV negative.   Would offer OV here in 3 months for follow up of abnormal LFTS and abnormal esophagus. This should give him time to complete EUS first.

## 2016-10-07 NOTE — Telephone Encounter (Signed)
Pt was called and a VM was left informing pt to return phone call. If pt returns call inform him that he can take robitussin for cough and antibiotic was sent to onsite pharmacy.

## 2016-10-07 NOTE — Progress Notes (Signed)
APPT MADE AND LETTER SENT  °

## 2016-10-08 ENCOUNTER — Ambulatory Visit: Payer: Self-pay | Admitting: Physical Therapy

## 2016-10-16 ENCOUNTER — Ambulatory Visit (INDEPENDENT_AMBULATORY_CARE_PROVIDER_SITE_OTHER): Payer: Self-pay | Admitting: Cardiology

## 2016-10-16 ENCOUNTER — Encounter: Payer: Self-pay | Admitting: Cardiology

## 2016-10-16 VITALS — BP 108/70 | HR 56 | Ht 63.0 in | Wt 180.2 lb

## 2016-10-16 DIAGNOSIS — I7789 Other specified disorders of arteries and arterioles: Secondary | ICD-10-CM

## 2016-10-16 DIAGNOSIS — R079 Chest pain, unspecified: Secondary | ICD-10-CM

## 2016-10-16 MED FILL — ?PANTOPRAZOLE SOD DR 40MG: 40 MG | 30 days supply | Qty: 30 | Fill #2

## 2016-10-16 NOTE — Progress Notes (Signed)
Cardiology Office Note   Date:  10/18/2016   ID:  Darren [name], DOB 12-22-1960, MRN [id number]  PCP:  Minerva Ends, MD  Cardiologist:   Minus Breeding, MD  Referring:  Minerva Ends, MD  Chief Complaint  Patient presents with  . Shortness of Breath      History of Present Illness: Darren [name] is a 56 y.o. male who presents for evaluation of an enlarged aorta and chest pain.  He saw Dr. Johnsie Cancel in 2013 preop for dental surgery.  He had a negative POET (Plain Old Exercise Treadmill).  He has a bit of a confusing history. He's had problems swallowing. He's had a GI workup. He's apparently had some esophageal wall thickening noted in the distant past. There was thought to be on a recent evaluation some compression possibly of his esophagus. However, there was nothing seen on CT to explain this. He did have an enlarged aorta at 42 mm. He actually saw Dr. Cyndia Bent in November about this. He's going to have follow-up CT in one year.  He did have an echo in Jan of this year and had mild LVH.  He did not comment on the aorta size. He does have some chest discomfort. This happens when he gets emotionally upset.  He is limited by back pain. He can do some activities. With his activities of daily living he denies being ever bring on his chest discomfort. His discomfort is midsternal. There is no radiation to her jaw or to his arms. He doesn't describe associated symptoms such as nausea vomiting or diaphoresis. Doesn't have palpitations, presyncope or syncope. He has no excessive shortness of breath, PND or orthopnea. Past Medical History:  Diagnosis Date  . Arthritis    per Medical clearance form.  . Ascending aortic aneurysm ([place]) 06/2016   4.2 cm  . Barrett esophagus 2001   path report in EPIC 2001 c/w Barrett's. endoscopy report unavailable.  Marland Kitchen COPD (chronic obstructive pulmonary disease) ([place])    per medical clearance form.  . Depression    per Medical Clearance form(09/18/11)    . GERD (gastroesophageal reflux disease)    per Medical clearance form.  . Hypertension    Per Medical Clearance form.  . Hypothyroidism    per Medical Clearance form.    Past Surgical History:  Procedure Laterality Date  . BIOPSY  09/18/2016   Procedure: BIOPSY;  Surgeon: Daneil Dolin, MD;  [facility];  Service: Endoscopy;;  biopsy of distal esophagus  . ESOPHAGOGASTRODUODENOSCOPY  2001   barrett's per path  . ESOPHAGOGASTRODUODENOSCOPY (EGD) WITH PROPOFOL N/A 09/18/2016   Procedure: ESOPHAGOGASTRODUODENOSCOPY (EGD) WITH PROPOFOL;  Surgeon: Daneil Dolin, MD;  [facility];  Service: Endoscopy;  Laterality: N/A;  1200  . [place]  . MULTIPLE EXTRACTIONS WITH ALVEOLOPLASTY  11/30/2011   Procedure: MULTIPLE EXTRACION WITH ALVEOLOPLASTY;  Surgeon: Gae Bon, DDS;  Location: [facility];  Service: Oral Surgery;  Laterality: Bilateral;     Current Outpatient Prescriptions  Medication Sig Dispense Refill  . Ascorbic Acid (VITAMIN C) 1000 MG tablet Take 1,000 mg by mouth 3 (three) times daily.    . benzonatate (TESSALON) 100 MG capsule Take 100 mg by mouth 2 (two) times daily as needed for cough.  0  . fluticasone (FLONASE) 50 MCG/ACT nasal spray Place 2 sprays into both nostrils daily. (Patient taking differently: Place 2 sprays into both nostrils daily as needed for  allergies. ) 16 g 6  . gabapentin (NEURONTIN) 300 MG capsule Take 1 capsule (300 mg total) by mouth 3 (three) times daily. (Patient taking differently: Take 300 mg by mouth at bedtime. ) 90 capsule 2  . levothyroxine (SYNTHROID, LEVOTHROID) 175 MCG tablet Take 1 tablet (175 mcg total) by mouth daily. 90 tablet 3  . losartan (COZAAR) 25 MG tablet Take 1 tablet (25 mg total) by mouth daily. 30 tablet 5  . pantoprazole (PROTONIX) 40 MG tablet Take 1 tablet (40 mg total) by mouth daily. 30 tablet 3   No current facility-administered medications for this visit.     Allergies:    Patient has no known allergies.    Social History:  The patient  reports that he quit smoking about 12 years ago. His smoking use included Cigarettes. He has a 26.00 pack-year smoking history. He has never used smokeless tobacco. He reports that he drinks alcohol. He reports that he does not use drugs.   Family History:  The patient's family history includes Cancer in his father; Heart attack (age of onset: 25) in his father; Lung cancer in his mother; Stroke in his father; Thyroid disease in his sister.    ROS:  Please see the history of present illness.   Otherwise, review of systems are positive for back pain, decreased hearing, difficulty swallowing.   All other systems are reviewed and negative.    PHYSICAL EXAM: VS:  BP 108/70   Pulse (!) 56   Ht 5\' 3"  (1.6 m)   Wt 180 lb 3.2 oz (81.7 kg)   BMI 31.92 kg/m  , BMI Body mass index is 31.92 kg/m. GENERAL:  Well appearing HEENT:  Pupils equal round and reactive, fundi not visualized, oral mucosa unremarkable NECK:  No jugular venous distention, waveform within normal limits, carotid upstroke brisk and symmetric, no bruits, no thyromegaly LYMPHATICS:  No cervical, inguinal adenopathy LUNGS:  Clear to auscultation bilaterally BACK:  No CVA tenderness CHEST:  Unremarkable HEART:  PMI not displaced or sustained,S1 and S2 within normal limits, no S3, no S4, no clicks, no rubs, no murmurs ABD:  Flat, positive bowel sounds normal in frequency in pitch, no bruits, no rebound, no guarding, no midline pulsatile mass, no hepatomegaly, no splenomegaly EXT:  2 plus pulses throughout, no edema, no cyanosis no clubbing SKIN:  No rashes no nodules NEURO:  Cranial nerves II through XII grossly intact, motor grossly intact throughout PSYCH:  Cognitively intact, oriented to person place and time    EKG:  EKG is ordered today. The ekg ordered today demonstrates sinus rhythm, rate 56, axis within normal limits, intervals within normal limits, no  acute ST-T wave changes.   Recent Labs: 07/09/2016: ALT 52; TSH 2.43 09/15/2016: BUN 19; Creatinine, Ser 0.79; Hemoglobin 15.1; Platelets 177; Potassium 3.8; Sodium 135    Lipid Panel    Component Value Date/Time   CHOL 193 10/31/2013 1633   TRIG 171 (H) 10/31/2013 1633   HDL 47 10/31/2013 1633   CHOLHDL 4.1 10/31/2013 1633   VLDL 34 10/31/2013 1633   LDLCALC 112 (H) 10/31/2013 1633      Wt Readings from Last 3 Encounters:  10/16/16 180 lb 3.2 oz (81.7 kg)  09/15/16 179 lb (81.2 kg)  09/07/16 179 lb 6.4 oz (81.4 kg)      Other studies Reviewed: Additional studies/ records that were reviewed today include: GI records and CT. Review of the above records demonstrates:  Please see elsewhere in the note.  ASSESSMENT AND PLAN:  AORTIC ENLARGEMENT:  This is mild to moderate and will be followed in one year with a follow-up CTA.  CHEST PAIN:  The pretest probability of obstructive coronary disease is somewhat low. I will bring the patient back for a POET (Plain Old Exercise Test). This will allow me to screen for obstructive coronary disease, risk stratify and very importantly provide a prescription for exercise.   Current medicines are reviewed at length with the patient today.  The patient does not have concerns regarding medicines.  The following changes have been made:  no change  Labs/ tests ordered today include:   Orders Placed This Encounter  Procedures  . EXERCISE TOLERANCE TEST  . EKG 12-Lead     Disposition:   FU with me as needed.      Signed, Minus Breeding, MD  10/18/2016 4:24 PM    [facility]

## 2016-10-16 NOTE — Patient Instructions (Signed)

## 2016-10-20 ENCOUNTER — Telehealth (HOSPITAL_COMMUNITY): Payer: Self-pay

## 2016-10-20 NOTE — Telephone Encounter (Signed)
Encounter complete. 

## 2016-10-22 ENCOUNTER — Ambulatory Visit (HOSPITAL_COMMUNITY)
Admission: RE | Admit: 2016-10-22 | Discharge: 2016-10-22 | Disposition: A | Discharge status: Home / Self Care | Payer: No Typology Code available for payment source | Source: Ambulatory Visit | Attending: Cardiology | Admitting: Cardiology

## 2016-10-22 DIAGNOSIS — R079 Chest pain, unspecified: Secondary | ICD-10-CM

## 2016-10-22 LAB — EXERCISE TOLERANCE TEST
CHL CUP MPHR: 165 {beats}/min
CHL CUP RESTING HR STRESS: 60 {beats}/min
CHL RATE OF PERCEIVED EXERTION: 19
Estimated workload: 7.8 METS
Exercise duration (min): 6 min
Exercise duration (sec): 33 s
Peak HR: 117 {beats}/min
Percent HR: 70 %

## 2016-10-23 ENCOUNTER — Ambulatory Visit (HOSPITAL_COMMUNITY): Payer: Self-pay | Admitting: Hematology & Oncology

## 2016-10-26 ENCOUNTER — Ambulatory Visit: Payer: Self-pay | Admitting: Family Medicine

## 2016-10-26 ENCOUNTER — Telehealth: Payer: Self-pay | Admitting: Cardiology

## 2016-10-26 NOTE — Telephone Encounter (Signed)
New Message    Pt returning phone call from [place].Darren KitchenMarland [name]

## 2016-10-26 NOTE — Telephone Encounter (Signed)
routed

## 2016-10-26 NOTE — Telephone Encounter (Signed)
Returned call to patient. LM w/results.

## 2016-10-28 ENCOUNTER — Telehealth: Payer: Self-pay | Admitting: Family Medicine

## 2016-10-28 MED FILL — ?LEVOTHYROXINE 175 MCG TABL: 175 | 30 days supply | Qty: 30 | Fill #3
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
## 2016-10-29 ENCOUNTER — Encounter: Payer: Self-pay | Admitting: Family Medicine

## 2016-10-29 ENCOUNTER — Telehealth: Payer: Self-pay | Admitting: Family Medicine

## 2016-10-29 ENCOUNTER — Ambulatory Visit: Payer: Self-pay | Attending: Family Medicine | Admitting: Family Medicine

## 2016-10-29 VITALS — BP 142/79 | HR 72 | Temp 97.5°F | Ht 63.0 in | Wt 181.8 lb

## 2016-10-29 DIAGNOSIS — R1310 Dysphagia, unspecified: Secondary | ICD-10-CM | POA: Insufficient documentation

## 2016-10-29 DIAGNOSIS — E039 Hypothyroidism, unspecified: Secondary | ICD-10-CM | POA: Insufficient documentation

## 2016-10-29 DIAGNOSIS — K219 Gastro-esophageal reflux disease without esophagitis: Secondary | ICD-10-CM | POA: Insufficient documentation

## 2016-10-29 DIAGNOSIS — M545 Low back pain: Secondary | ICD-10-CM | POA: Insufficient documentation

## 2016-10-29 DIAGNOSIS — Z87891 Personal history of nicotine dependence: Secondary | ICD-10-CM | POA: Insufficient documentation

## 2016-10-29 DIAGNOSIS — G8929 Other chronic pain: Secondary | ICD-10-CM | POA: Insufficient documentation

## 2016-10-29 MED ORDER — ACETAMINOPHEN-CODEINE #3 300-30 MG PO TABS: 1.0000 | ORAL_TABLET | Freq: Three times a day (TID) | ORAL | 0 refills | Status: DC | PRN

## 2016-10-29 MED ORDER — AMITRIPTYLINE HCL 50 MG PO TABS: 50.0000 mg | ORAL_TABLET | Freq: Every day | ORAL | 1 refills | Status: DC

## 2016-10-29 MED FILL — ?AMITRIPTYLINE HCL 50MG TAB: 50 | 30 days supply | Qty: 30 | Fill #0

## 2016-10-29 MED FILL — ACETAMINOPHEN/COD #3 TABLET: 300-30 | 20 days supply | Qty: 60 | Fill #0

## 2016-10-29 NOTE — Assessment & Plan Note (Signed)
Chronic pain without radicular symptoms,severe pain Add elavil 50 mg nightly, taper up to 100 mg nightly as tolerated Tylenol #3 Pain management
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
## 2016-10-29 NOTE — Telephone Encounter (Signed)
Patient would like a letter stating what his limitations are due to his medical conditions..  Please follow up

## 2016-10-29 NOTE — Patient Instructions (Addendum)
Darren [name] was seen today for back pain.  Diagnoses and all orders for this visit:  Chronic midline low back pain without sciatica -     Ambulatory referral to Pain Clinic -     acetaminophen-codeine (TYLENOL #3) 300-30 MG tablet; Take 1 tablet by mouth every 8 (eight) hours as needed for moderate pain. -     amitriptyline (ELAVIL) 50 MG tablet; Take 1 tablet (50 mg total) by mouth at bedtime.  wait to start elavil until Saturday night. If you tolerate it and it helps. Increase to 100 mg (two tabs) nightly after one week.  You can start tylenol #3 for back pain right away.   F/u in 3 weeks for back pain   Dr. Adrian Blackwater

## 2016-10-29 NOTE — Telephone Encounter (Signed)
Patient has OV today

## 2016-10-30 NOTE — Telephone Encounter (Signed)
Will route to PCP 

## 2016-11-02 ENCOUNTER — Ambulatory Visit: Payer: Self-pay | Admitting: Family Medicine

## 2016-11-09 NOTE — Telephone Encounter (Signed)
Please inform patient that limitation letter written and ready for pick up

## 2016-11-09 NOTE — Telephone Encounter (Signed)
Pt following up on letter stating his limitations

## 2016-11-09 NOTE — Telephone Encounter (Signed)
Dr. Adrian Blackwater is working on letter for pt. reviewed on 11/09/16

## 2016-11-09 NOTE — Telephone Encounter (Signed)
Pt was called and informed of paperwork being ready for pick up. Pt states that he would like [facility] to a debra coleman.

## 2016-11-16 ENCOUNTER — Encounter: Payer: Self-pay | Admitting: Physical Medicine & Rehabilitation

## 2016-11-16 ENCOUNTER — Ambulatory Visit: Payer: Self-pay

## 2016-11-16 MED FILL — ?PANTOPRAZOLE SOD DR 40MG: 40 MG | 30 days supply | Qty: 30 | Fill #3

## 2016-11-16 MED FILL — ?LEVOTHYROXINE 175 MCG TABL: 175 | 30 days supply | Qty: 30 | Fill #4

## 2016-11-19 ENCOUNTER — Ambulatory Visit: Payer: Self-pay | Attending: Family Medicine | Admitting: Family Medicine

## 2016-11-19 DIAGNOSIS — R1310 Dysphagia, unspecified: Secondary | ICD-10-CM | POA: Insufficient documentation

## 2016-11-19 DIAGNOSIS — E039 Hypothyroidism, unspecified: Secondary | ICD-10-CM | POA: Insufficient documentation

## 2016-11-19 DIAGNOSIS — M545 Low back pain: Secondary | ICD-10-CM | POA: Insufficient documentation

## 2016-11-19 DIAGNOSIS — G8929 Other chronic pain: Secondary | ICD-10-CM | POA: Insufficient documentation

## 2016-11-19 DIAGNOSIS — Z87891 Personal history of nicotine dependence: Secondary | ICD-10-CM | POA: Insufficient documentation

## 2016-11-19 DIAGNOSIS — K219 Gastro-esophageal reflux disease without esophagitis: Secondary | ICD-10-CM | POA: Insufficient documentation

## 2016-11-19 MED ORDER — PANTOPRAZOLE SODIUM 40 MG PO TBEC: 40.0000 mg | DELAYED_RELEASE_TABLET | Freq: Every day | ORAL | 5 refills | Status: DC

## 2016-11-19 MED ORDER — KETOROLAC TROMETHAMINE 60 MG/2ML IM SOLN
60.0000 mg | Freq: Once | INTRAMUSCULAR | Status: AC
  Administered 2016-11-19: 60 mg via INTRAMUSCULAR

## 2016-11-19 MED ORDER — ACETAMINOPHEN-CODEINE #3 300-30 MG PO TABS: 1.0000 | ORAL_TABLET | Freq: Three times a day (TID) | ORAL | 2 refills | Status: DC | PRN

## 2016-11-19 MED FILL — ?PANTOPRAZOLE SOD DR 40MG: 40 MG | 30 days supply | Qty: 30 | Fill #0

## 2016-11-19 MED FILL — ACETAMINOPHEN/COD #3 TABLET: 300-30 | 20 days supply | Qty: 60 | Fill #0
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
## 2016-11-19 NOTE — Patient Instructions (Addendum)
Darren [name] was seen today for back pain.  Diagnoses and all orders for this visit:  Chronic midline low back pain without sciatica -     acetaminophen-codeine (TYLENOL #3) 300-30 MG tablet; Take 1 tablet by mouth every 8 (eight) hours as needed for moderate pain. -     ketorolac (TORADOL) injection 60 mg; Inject 2 mLs (60 mg total) into the muscle once.  Gastroesophageal reflux disease, esophagitis presence not specified -     pantoprazole (PROTONIX) 40 MG tablet; Take 1 tablet (40 mg total) by mouth daily.    F/u in 6 weeks for chronic low back pain   Dr.  Adrian Blackwater

## 2016-11-22 NOTE — Assessment & Plan Note (Addendum)
Chronic pain with radicular symptoms down R leg  Continue tylenol #3 Toradol shot

## 2016-11-23 ENCOUNTER — Encounter (HOSPITAL_COMMUNITY): Payer: Self-pay

## 2016-11-23 ENCOUNTER — Encounter (HOSPITAL_COMMUNITY): Payer: Self-pay | Attending: Oncology | Admitting: Oncology

## 2016-11-23 DIAGNOSIS — D72820 Lymphocytosis (symptomatic): Secondary | ICD-10-CM | POA: Insufficient documentation

## 2016-11-23 DIAGNOSIS — D72829 Elevated white blood cell count, unspecified: Secondary | ICD-10-CM | POA: Insufficient documentation

## 2016-11-23 LAB — COMPREHENSIVE METABOLIC PANEL
ALT: 61 U/L (ref 17–63)
ANION GAP: 6 (ref 5–15)
AST: 38 U/L (ref 15–41)
Albumin: 4.1 g/dL (ref 3.5–5.0)
Alkaline Phosphatase: 51 U/L (ref 38–126)
BUN: 11 mg/dL (ref 6–20)
CALCIUM: 9.3 mg/dL (ref 8.9–10.3)
CHLORIDE: 104 mmol/L (ref 101–111)
CO2: 26 mmol/L (ref 22–32)
CREATININE: 0.73 mg/dL (ref 0.61–1.24)
Glucose, Bld: 99 mg/dL (ref 65–99)
Potassium: 4.1 mmol/L (ref 3.5–5.1)
SODIUM: 136 mmol/L (ref 135–145)
Total Bilirubin: 0.6 mg/dL (ref 0.3–1.2)
Total Protein: 6.8 g/dL (ref 6.5–8.1)

## 2016-11-23 LAB — CBC WITH DIFFERENTIAL/PLATELET
Basophils Absolute: 0 10*3/uL (ref 0.0–0.1)
Basophils Relative: 0 %
EOS ABS: 0.3 10*3/uL (ref 0.0–0.7)
EOS PCT: 3 %
HCT: 41.2 % (ref 39.0–52.0)
Hemoglobin: 14.3 g/dL (ref 13.0–17.0)
Lymphocytes Relative: 54 %
Lymphs Abs: 5.3 10*3/uL — ABNORMAL HIGH (ref 0.7–4.0)
MCH: 27.8 pg (ref 26.0–34.0)
MCHC: 34.7 g/dL (ref 30.0–36.0)
MCV: 80.2 fL (ref 78.0–100.0)
MONO ABS: 0.7 10*3/uL (ref 0.1–1.0)
MONOS PCT: 7 %
Neutro Abs: 3.6 10*3/uL (ref 1.7–7.7)
Neutrophils Relative %: 36 %
PLATELETS: 176 10*3/uL (ref 150–400)
RBC: 5.14 MIL/uL (ref 4.22–5.81)
RDW: 12.7 % (ref 11.5–15.5)
WBC: 9.9 10*3/uL (ref 4.0–10.5)
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
## 2016-11-23 NOTE — Patient Instructions (Signed)
[place] at [facility] Discharge Instructions  RECOMMENDATIONS MADE BY THE CONSULTANT AND ANY TEST RESULTS WILL BE SENT TO YOUR REFERRING PHYSICIAN.  You were seen today by Dr. Twana First Lab work today and we will call you with the results Follow up in 3 weeks See Darren [name] up front for appointments   Thank you for choosing [place] at [facility] to provide your oncology and hematology care.  To afford each patient quality time with our provider, please arrive at least 15 minutes before your scheduled appointment time.    If you have a lab appointment with the [facility] please come in thru the  Main Entrance and check in at the main information desk  You need to re-schedule your appointment should you arrive 10 or more minutes late.  We strive to give you quality time with our providers, and arriving late affects you and other patients whose appointments are after yours.  Also, if you no show three or more times for appointments you may be dismissed from the clinic at the providers discretion.     Again, thank you for choosing [facility].  Our hope is that these requests will decrease the amount of time that you wait before being seen by our physicians.       _____________________________________________________________  Should you have questions after your visit to [facility], please contact our office at [phone] between the hours of 8:30 a.m. and 4:30 p.m.  Voicemails left after 4:30 p.m. will not be returned until the following business day.  For prescription refill requests, have your pharmacy contact our office.       Resources For Cancer Patients and their Caregivers ? American Cancer Society: Can assist with transportation, wigs, general needs, runs Look Good Feel Better.        (864)729-7648 ? Cancer Care: Provides financial assistance, online support groups, medication/co-pay assistance.   1-800-813-HOPE (509)349-4843) ? [place] Assists [place] cancer patients and their families through emotional , educational and financial support.  850-210-1771 ? [place] DSS Where to apply for food stamps, Medicaid and utility assistance. 813-186-2745 ? RCATS: Transportation to medical appointments. (772)571-4575 ? Social Security Administration: May apply for disability if have a Stage IV cancer. 330-480-9936 (214) 654-7160 ? [employer], Disability and Transit Services: Assists with nutrition, care and transit needs. [place] Support Programs: @10RELATIVEDAYS @ > Cancer Support Group  2nd Tuesday of the month 1pm-2pm, Journey Room  > Creative Journey  3rd Tuesday of the month 1130am-1pm, Journey Room  > Look Good Feel Better  1st Wednesday of the month 10am-12 noon, Journey Room (Call [place] to register [phone])

## 2016-11-29 LAB — BCR-ABL1, CML/ALL, PCR, QUANT

## 2016-12-03 LAB — CALR + JAK2 E12-15 + MPL (REFLEXED)

## 2016-12-03 LAB — JAK2 V617F, W REFLEX TO CALR/E12/MPL

## 2016-12-07 ENCOUNTER — Ambulatory Visit: Payer: Self-pay

## 2016-12-09 MED FILL — LOSARTAN POTASSIUM 25 MG TA: 25 | 30 days supply | Qty: 30 | Fill #2

## 2016-12-14 ENCOUNTER — Telehealth: Payer: Self-pay

## 2016-12-14 ENCOUNTER — Ambulatory Visit: Payer: Self-pay | Attending: Family Medicine

## 2016-12-14 DIAGNOSIS — Z736 Limitation of activities due to disability: Secondary | ICD-10-CM

## 2016-12-14 NOTE — Telephone Encounter (Signed)
If possible, he should keep his appointment with [facility] tomorrow since that appt is to go over labs done from his last appt.   He could discuss transfer of care to [facility] with them, they may be able to facilitate that more effectively.   Let me know what he decides.

## 2016-12-14 NOTE — Telephone Encounter (Signed)
Pt is wanting to try and move his care from [facility] to somewhere in [place] that will take the [facility]. It is hard for him to get to [place] from [place]. Please advise

## 2016-12-15 ENCOUNTER — Encounter (HOSPITAL_COMMUNITY): Payer: Self-pay | Admitting: Hematology

## 2016-12-15 ENCOUNTER — Encounter (HOSPITAL_COMMUNITY): Payer: Self-pay | Attending: Oncology | Admitting: Hematology

## 2016-12-15 VITALS — BP 140/90 | HR 83 | Temp 98.3°F | Resp 18 | Wt 185.5 lb

## 2016-12-15 DIAGNOSIS — D72820 Lymphocytosis (symptomatic): Secondary | ICD-10-CM

## 2016-12-15 NOTE — Patient Instructions (Signed)
[place] at [facility] Discharge Instructions  RECOMMENDATIONS MADE BY THE CONSULTANT AND ANY TEST RESULTS WILL BE SENT TO YOUR REFERRING PHYSICIAN.  You were seen today by Dr. Irene Limbo. Return in 3 months for labs and follow up.   Thank you for choosing [place] at [facility] to provide your oncology and hematology care.  To afford each patient quality time with our provider, please arrive at least 15 minutes before your scheduled appointment time.    If you have a lab appointment with the [facility] please come in thru the  Main Entrance and check in at the main information desk  You need to re-schedule your appointment should you arrive 10 or more minutes late.  We strive to give you quality time with our providers, and arriving late affects you and other patients whose appointments are after yours.  Also, if you no show three or more times for appointments you may be dismissed from the clinic at the providers discretion.     Again, thank you for choosing [facility].  Our hope is that these requests will decrease the amount of time that you wait before being seen by our physicians.       _____________________________________________________________  Should you have questions after your visit to [facility], please contact our office at [phone] between the hours of 8:30 a.m. and 4:30 p.m.  Voicemails left after 4:30 p.m. will not be returned until the following business day.  For prescription refill requests, have your pharmacy contact our office.       Resources For Cancer Patients and their Caregivers ? American Cancer Society: Can assist with transportation, wigs, general needs, runs Look Good Feel Better.        (302)259-0127 ? Cancer Care: Provides financial assistance, online support groups, medication/co-pay assistance.  1-800-813-HOPE 414-651-5883) ? [place] Assists  [place] cancer patients and their families through emotional , educational and financial support.  904 501 9046 ? [place] DSS Where to apply for food stamps, Medicaid and utility assistance. 3125002473 ? RCATS: Transportation to medical appointments. (607)315-0363 ? Social Security Administration: May apply for disability if have a Stage IV cancer. 870-578-0186 843-412-6363 ? [employer], Disability and Transit Services: Assists with nutrition, care and transit needs. [place] Support Programs: @10RELATIVEDAYS @ > Cancer Support Group  2nd Tuesday of the month 1pm-2pm, Journey Room  > Creative Journey  3rd Tuesday of the month 1130am-1pm, Journey Room  > Look Good Feel Better  1st Wednesday of the month 10am-12 noon, Journey Room (Call [place] to register [phone])

## 2016-12-15 NOTE — Telephone Encounter (Signed)
Spoke with Angie at the [facility] and she said that she would let him know.

## 2016-12-17 ENCOUNTER — Telehealth: Payer: Self-pay | Admitting: Hematology

## 2016-12-17 NOTE — Telephone Encounter (Signed)
Confirmed scheduled appt per 3/21 sch message from [facility]. Patient is aware of new appt time and date.
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
## 2016-12-21 ENCOUNTER — Encounter: Payer: Self-pay | Admitting: Physical Medicine & Rehabilitation

## 2016-12-21 ENCOUNTER — Ambulatory Visit (HOSPITAL_BASED_OUTPATIENT_CLINIC_OR_DEPARTMENT_OTHER): Payer: Self-pay | Admitting: Physical Medicine & Rehabilitation

## 2016-12-21 ENCOUNTER — Encounter: Payer: Self-pay | Attending: Physical Medicine & Rehabilitation

## 2016-12-21 VITALS — BP 145/85 | HR 79

## 2016-12-21 DIAGNOSIS — M51369 Other intervertebral disc degeneration, lumbar region without mention of lumbar back pain or lower extremity pain: Secondary | ICD-10-CM | POA: Insufficient documentation

## 2016-12-21 DIAGNOSIS — M5136 Other intervertebral disc degeneration, lumbar region: Secondary | ICD-10-CM | POA: Insufficient documentation

## 2016-12-21 DIAGNOSIS — M47816 Spondylosis without myelopathy or radiculopathy, lumbar region: Secondary | ICD-10-CM

## 2016-12-21 DIAGNOSIS — G8929 Other chronic pain: Secondary | ICD-10-CM | POA: Insufficient documentation

## 2016-12-21 DIAGNOSIS — M47896 Other spondylosis, lumbar region: Secondary | ICD-10-CM | POA: Insufficient documentation

## 2016-12-21 DIAGNOSIS — M259 Joint disorder, unspecified: Secondary | ICD-10-CM | POA: Insufficient documentation

## 2016-12-21 DIAGNOSIS — M545 Low back pain: Secondary | ICD-10-CM | POA: Insufficient documentation

## 2016-12-21 DIAGNOSIS — M2580 Other specified joint disorders, unspecified joint: Secondary | ICD-10-CM | POA: Insufficient documentation
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
## 2016-12-21 NOTE — Patient Instructions (Signed)

## 2016-12-31 ENCOUNTER — Encounter: Payer: Self-pay | Admitting: Family Medicine

## 2016-12-31 ENCOUNTER — Ambulatory Visit: Payer: Self-pay | Attending: Family Medicine | Admitting: Family Medicine

## 2016-12-31 VITALS — BP 121/83 | HR 90 | Temp 97.8°F | Wt 183.8 lb

## 2016-12-31 DIAGNOSIS — K219 Gastro-esophageal reflux disease without esophagitis: Secondary | ICD-10-CM | POA: Insufficient documentation

## 2016-12-31 DIAGNOSIS — R1310 Dysphagia, unspecified: Secondary | ICD-10-CM | POA: Insufficient documentation

## 2016-12-31 DIAGNOSIS — G8929 Other chronic pain: Secondary | ICD-10-CM | POA: Insufficient documentation

## 2016-12-31 DIAGNOSIS — M545 Low back pain, unspecified: Secondary | ICD-10-CM

## 2016-12-31 DIAGNOSIS — S3992XS Unspecified injury of lower back, sequela: Secondary | ICD-10-CM | POA: Insufficient documentation

## 2016-12-31 DIAGNOSIS — F4321 Adjustment disorder with depressed mood: Secondary | ICD-10-CM | POA: Insufficient documentation

## 2016-12-31 DIAGNOSIS — E039 Hypothyroidism, unspecified: Secondary | ICD-10-CM | POA: Insufficient documentation

## 2016-12-31 DIAGNOSIS — I10 Essential (primary) hypertension: Secondary | ICD-10-CM | POA: Insufficient documentation

## 2016-12-31 DIAGNOSIS — Z87891 Personal history of nicotine dependence: Secondary | ICD-10-CM | POA: Insufficient documentation

## 2016-12-31 MED ORDER — DULOXETINE HCL 60 MG PO CPEP: 60.0000 mg | ORAL_CAPSULE | Freq: Every day | ORAL | 2 refills | Status: DC

## 2016-12-31 MED ORDER — LEVOTHYROXINE SODIUM 175 MCG PO TABS: 175.0000 ug | ORAL_TABLET | Freq: Every day | ORAL | 5 refills | Status: DC

## 2016-12-31 MED ORDER — LOSARTAN POTASSIUM 25 MG PO TABS: 25.0000 mg | ORAL_TABLET | Freq: Every day | ORAL | 5 refills | Status: DC

## 2016-12-31 MED FILL — ?LEVOTHYROXINE 175 MCG TABL: 175 | 30 days supply | Qty: 30 | Fill #0

## 2016-12-31 MED FILL — DULoxetine HCL 60 MG CPEP: 60 | 30 days supply | Qty: 30 | Fill #0

## 2016-12-31 NOTE — Patient Instructions (Addendum)
Darren [name] was seen today for back pain.  Diagnoses and all orders for this visit:  Situational depression -     DULoxetine (CYMBALTA) 60 MG capsule; Take 1 capsule (60 mg total) by mouth daily.  Chronic midline low back pain without sciatica -     DULoxetine (CYMBALTA) 60 MG capsule; Take 1 capsule (60 mg total) by mouth daily.  Hypertension, unspecified type -     losartan (COZAAR) 25 MG tablet; Take 1 tablet (25 mg total) by mouth daily.  Hypothyroidism, unspecified type -     levothyroxine (SYNTHROID, LEVOTHROID) 175 MCG tablet; Take 1 tablet (175 mcg total) by mouth daily.   Taper off tylenol #3 decrease to twice daily for one week, then once daily for one week, then every other day for on week then stop.  Keep appointment to have injections done in your low back   f/u in 6 weeks for low back pain  Dr. Adrian Blackwater

## 2016-12-31 NOTE — Assessment & Plan Note (Signed)
Chronic pain Pain remains uncontrolled Plan: Taper off tylenol #3 as it is not helping with pain relief Increase cymabalta from 20 mg BID to 60 mg daily Continue elavil 50 mg nightly Keep f/u with pain management
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
## 2017-01-03 NOTE — Progress Notes (Signed)
Cardiology Office Note   Date:  01/04/2017   ID:  Darren [name], DOB 1961-07-29, MRN [id number]  PCP:  Minerva Ends, MD  Cardiologist:   Minus Breeding, MD     Chief Complaint  Patient presents with  . Chest Pain      History of Present Illness: Darren [name] is a 56 y.o. male who presents for evaluation of an enlarged aorta and chest pain.  He saw Dr. Johnsie Cancel in 2013 preop for dental surgery.  He had a negative POET (Plain Old Exercise Treadmill).  He has a bit of a confusing history. He's had problems swallowing. He's had a GI workup. He's apparently had some esophageal wall thickening noted in the distant past. There was thought to be on a recent evaluation some compression possibly of his esophagus. However, there was nothing seen on CT to explain this. He did have an enlarged aorta at 42 mm. He actually saw Dr. Cyndia Bent in November about this. He's going to have follow-up CT in one year.  He did have an echo in Jan of this year and had mild LVH.     When I saw him recently I sent him for a POET (Plain Old Exercise Treadmill).  This was negative for evidence of ischemia but because of leg pain he only achieved 70% of his target heart rate.  I brought him back today to see if the symptoms were increasing. He describes some vague chest discomfort rarely. However, he says it's very infrequent. It seems to be less than before. He's doing activities such as weeding doesn't necessarily bring this on. He's mostly limited by back pain. He has no jaw or arm discomfort. He's not describing new shortness of breath, PND or orthopnea.  Past Medical History:  Diagnosis Date  . Arthritis    per Medical clearance form.  . Ascending aortic aneurysm ([place]) 06/2016   4.2 cm  . Barrett esophagus 2001   path report in EPIC 2001 c/w Barrett's. endoscopy report unavailable.  Marland Kitchen COPD (chronic obstructive pulmonary disease) ([place])    per medical clearance form.  . Depression    per Medical Clearance  form(09/18/11)  . GERD (gastroesophageal reflux disease)    per Medical clearance form.  . Hypertension    Per Medical Clearance form.  . Hypothyroidism    per Medical Clearance form.    Past Surgical History:  Procedure Laterality Date  . BIOPSY  09/18/2016   Procedure: BIOPSY;  Surgeon: Daneil Dolin, MD;  [facility];  Service: Endoscopy;;  biopsy of distal esophagus  . ESOPHAGOGASTRODUODENOSCOPY  2001   barrett's per path  . ESOPHAGOGASTRODUODENOSCOPY (EGD) WITH PROPOFOL N/A 09/18/2016   Procedure: ESOPHAGOGASTRODUODENOSCOPY (EGD) WITH PROPOFOL;  Surgeon: Daneil Dolin, MD;  [facility];  Service: Endoscopy;  Laterality: N/A;  1200  . [place]  . MULTIPLE EXTRACTIONS WITH ALVEOLOPLASTY  11/30/2011   Procedure: MULTIPLE EXTRACION WITH ALVEOLOPLASTY;  Surgeon: Gae Bon, DDS;  Location: [place];  Service: Oral Surgery;  Laterality: Bilateral;     Current Outpatient Prescriptions  Medication Sig Dispense Refill  . acetaminophen-codeine (TYLENOL #3) 300-30 MG tablet Take 1 tablet by mouth every 8 (eight) hours as needed for moderate pain. 60 tablet 2  . amitriptyline (ELAVIL) 50 MG tablet Take 1 tablet (50 mg total) by mouth at bedtime. 30 tablet 1  . amoxicillin (AMOXIL) 500 MG capsule Take 500 mg by mouth 3 (  three) times daily.    . DULoxetine (CYMBALTA) 60 MG capsule Take 1 capsule (60 mg total) by mouth daily. 30 capsule 2  . fluticasone (FLONASE) 50 MCG/ACT nasal spray Place 2 sprays into both nostrils daily. (Patient taking differently: Place 2 sprays into both nostrils daily as needed for allergies. ) 16 g 6  . gabapentin (NEURONTIN) 300 MG capsule Take 300 mg by mouth.    . hydrOXYzine (ATARAX/VISTARIL) 25 MG tablet Take 25 mg by mouth 2 (two) times daily.    Marland Kitchen levothyroxine (SYNTHROID, LEVOTHROID) 175 MCG tablet Take 1 tablet (175 mcg total) by mouth daily. 30 tablet 5  . losartan (COZAAR) 25 MG tablet Take 1 tablet (25 mg  total) by mouth daily. 30 tablet 5  . pantoprazole (PROTONIX) 40 MG tablet Take 1 tablet (40 mg total) by mouth daily. 30 tablet 5   No current facility-administered medications for this visit.     Allergies:   Patient has no known allergies.     ROS:  Please see the history of present illness.   Otherwise, review of systems are positive for back pain.   All other systems are reviewed and negative.    PHYSICAL EXAM: VS:  BP 120/84   Pulse 85   Ht 5\' 3"  (1.6 m)   Wt 186 lb 3.2 oz (84.5 kg)   BMI 32.98 kg/m  , BMI Body mass index is 32.98 kg/m. GENERAL:  Well appearing and in no distress NECK:  No jugular venous distention, waveform within normal limits, carotid upstroke brisk and symmetric, no bruits, no thyromegaly LUNGS:  Clear to auscultation bilaterally BACK:  No CVA tenderness CHEST:  Unremarkable HEART:  PMI not displaced or sustained,S1 and S2 within normal limits, no S3, no S4, no clicks, no rubs, no murmurs ABD:  Flat, positive bowel sounds normal in frequency in pitch, no bruits, no rebound, no guarding, no midline pulsatile mass, no hepatomegaly, no splenomegaly EXT:  2 plus pulses throughout, no edema, no cyanosis no clubbing    EKG:  EKG is not ordered today.   Recent Labs: 07/09/2016: TSH 2.43 11/23/2016: ALT 61; BUN 11; Creatinine, Ser 0.73; Hemoglobin 14.3; Platelets 176; Potassium 4.1; Sodium 136     Wt Readings from Last 3 Encounters:  01/04/17 186 lb 3.2 oz (84.5 kg)  12/31/16 183 lb 12.8 oz (83.4 kg)  12/15/16 185 lb 8 oz (84.1 kg)      Other studies Reviewed: Additional studies/ records that were reviewed today include: POET (Plain Old Exercise Treadmill) Review of the above records demonstrates:  Please see elsewhere in the note.     ASSESSMENT AND PLAN:  AORTIC ENLARGEMENT:     This is mild to moderate and will be followed in one year with a follow-up CTA.  CHEST PAIN:   He had a negative submaximal POET (Plain Old Exercise Treadmill).  He  is not describing any unstable symptoms.  At this point I don't think that further testing is indicated.  He will let me know if symptoms worsen.  He will need continued primary risk reduction.     Current medicines are reviewed at length with the patient today.  The patient does not have concerns regarding medicines.  The following changes have been made:  None  Labs/ tests ordered today include:  None No orders of the defined types were placed in this encounter.    Disposition:   FU with me as needed..      Signed, Minus Breeding, MD  01/04/2017 11:36 AM    [facility]

## 2017-01-04 ENCOUNTER — Ambulatory Visit (INDEPENDENT_AMBULATORY_CARE_PROVIDER_SITE_OTHER): Payer: Self-pay | Admitting: Cardiology

## 2017-01-04 ENCOUNTER — Encounter: Payer: Self-pay | Admitting: Cardiology

## 2017-01-04 VITALS — BP 120/84 | HR 85 | Ht 63.0 in | Wt 186.2 lb

## 2017-01-04 DIAGNOSIS — I7121 Aneurysm of the ascending aorta, without rupture: Secondary | ICD-10-CM

## 2017-01-04 DIAGNOSIS — I719 Aortic aneurysm of unspecified site, without rupture: Secondary | ICD-10-CM

## 2017-01-04 DIAGNOSIS — R072 Precordial pain: Secondary | ICD-10-CM

## 2017-01-04 NOTE — Patient Instructions (Signed)
Medication Instructions:  Continue current medications  Labwork: None Ordered  Testing/Procedures: None Ordered  Follow-Up: Your physician recommends that you schedule a follow-up appointment in: As Needed   Any Other Special Instructions Will Be Listed Below (If Applicable).   If you need a refill on your cardiac medications before your next appointment, please call your pharmacy.   

## 2017-01-05 ENCOUNTER — Ambulatory Visit: Payer: Self-pay | Admitting: Gastroenterology

## 2017-01-11 MED FILL — PANTOPRAZOLE SOD DR 40 MG T: 40 | 30 days supply | Qty: 30 | Fill #1

## 2017-01-11 MED FILL — LOSARTAN POTASSIUM 25 MG TA: 25 | 30 days supply | Qty: 30 | Fill #0

## 2017-01-12 ENCOUNTER — Ambulatory Visit: Payer: Self-pay | Admitting: Physical Medicine & Rehabilitation

## 2017-01-15 ENCOUNTER — Ambulatory Visit: Payer: Self-pay | Admitting: Physical Medicine & Rehabilitation

## 2017-01-15 ENCOUNTER — Telehealth: Payer: Self-pay | Admitting: Family Medicine

## 2017-01-15 NOTE — Telephone Encounter (Signed)
Will forward to pcp

## 2017-01-15 NOTE — Telephone Encounter (Signed)
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 Please follow up.  Thank you.

## 2017-01-19 NOTE — Telephone Encounter (Signed)
Letter written and picked up by patient

## 2017-01-29 ENCOUNTER — Ambulatory Visit (HOSPITAL_COMMUNITY)
Admission: RE | Admit: 2017-01-29 | Discharge: 2017-01-29 | Disposition: A | Discharge status: Home / Self Care | Payer: No Typology Code available for payment source | Source: Ambulatory Visit | Attending: Physician Assistant | Admitting: Physician Assistant

## 2017-01-29 ENCOUNTER — Ambulatory Visit: Payer: No Typology Code available for payment source | Attending: Physician Assistant | Admitting: Physician Assistant

## 2017-01-29 ENCOUNTER — Encounter: Payer: Self-pay | Admitting: Physician Assistant

## 2017-01-29 VITALS — BP 151/98 | HR 80 | Temp 98.1°F | Resp 16 | Wt 187.6 lb

## 2017-01-29 DIAGNOSIS — Z79899 Other long term (current) drug therapy: Secondary | ICD-10-CM | POA: Insufficient documentation

## 2017-01-29 DIAGNOSIS — F329 Major depressive disorder, single episode, unspecified: Secondary | ICD-10-CM | POA: Insufficient documentation

## 2017-01-29 DIAGNOSIS — I712 Thoracic aortic aneurysm, without rupture: Secondary | ICD-10-CM | POA: Insufficient documentation

## 2017-01-29 DIAGNOSIS — I10 Essential (primary) hypertension: Secondary | ICD-10-CM | POA: Diagnosis not present

## 2017-01-29 DIAGNOSIS — J449 Chronic obstructive pulmonary disease, unspecified: Secondary | ICD-10-CM | POA: Insufficient documentation

## 2017-01-29 DIAGNOSIS — M79641 Pain in right hand: Secondary | ICD-10-CM | POA: Insufficient documentation

## 2017-01-29 DIAGNOSIS — K2270 Barrett's esophagus without dysplasia: Secondary | ICD-10-CM | POA: Diagnosis not present

## 2017-01-29 DIAGNOSIS — E039 Hypothyroidism, unspecified: Secondary | ICD-10-CM | POA: Insufficient documentation

## 2017-01-29 MED ORDER — NAPROXEN 500 MG PO TABS: 500.0000 mg | ORAL_TABLET | Freq: Two times a day (BID) | ORAL | 0 refills | Status: DC

## 2017-01-29 MED FILL — NAPROXEN 500 MG TABLET: 500 | 30 days supply | Qty: 60 | Fill #0

## 2017-01-29 NOTE — Progress Notes (Signed)
Darren [name], is a 56 y.o. male  JOA:[id number]  SWF:[id number]  DOB - [birth year]  Subjective:  Chief Complaint and HPI: Darren [name] is a 56 y.o. male here today w/ complaints of intermittent R hand pain over several years that has been worse over the last month.  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  He doesn't remember xrays ever having been done of his hand.  He has arthritis in his back.  He is R hand dominant.  There was no recent injury to his hand.  Pain radiates up into his R arm at times.  He has used OTC biofreeze without relief.  He says he is no longer taking Tylenol #3.  He follows up with [facility] for depression.  He says that is stable/unchanged currently.  He has been having some trouble with sleep that they are helping him with since recently increasing Cymbalta to 60mg .  ROS:   Constitutional:  No f/c, No night sweats, No unexplained weight loss. EENT:  No vision changes, No blurry vision, No hearing changes. No mouth, throat, or ear problems.  Respiratory: No cough, No SOB Cardiac: No CP, no palpitations GI:  No abd pain, No N/V/D. GU: No Urinary s/sx Musculoskeletal: R hand pain Neuro: No headache, no dizziness, no motor weakness.  Skin: No rash Endocrine:  No polydipsia. No polyuria.  Psych: Denies SI/HI  No problems updated.  ALLERGIES: No Known Allergies  PAST MEDICAL HISTORY: Past Medical History:  Diagnosis Date  . Arthritis    per Medical clearance form.  . Ascending aortic aneurysm ([place]) 06/2016   4.2 cm  . Barrett esophagus 2001   path report in EPIC 2001 c/w Barrett's. endoscopy report unavailable.  Marland Kitchen COPD (chronic obstructive pulmonary disease) ([place])    per medical clearance form.  . Depression    per Medical Clearance form(09/18/11)  . GERD (gastroesophageal reflux disease)    per Medical clearance form.  . Hypertension    Per Medical Clearance form.  . Hypothyroidism    per Medical Clearance  form.    MEDICATIONS AT HOME: Prior to Admission medications   Medication Sig Start Date End Date Taking? Authorizing Provider  amitriptyline (ELAVIL) 50 MG tablet Take 1 tablet (50 mg total) by mouth at bedtime. 10/29/16   Josalyn Funches, MD  DULoxetine (CYMBALTA) 60 MG capsule Take 1 capsule (60 mg total) by mouth daily. 12/31/16   Josalyn Funches, MD  fluticasone (FLONASE) 50 MCG/ACT nasal spray Place 2 sprays into both nostrils daily. Patient taking differently: Place 2 sprays into both nostrils daily as needed for allergies.  08/27/16   Argentina Donovan, PA-C  gabapentin (NEURONTIN) 300 MG capsule Take 300 mg by mouth.    Historical Provider, MD  hydrOXYzine (ATARAX/VISTARIL) 25 MG tablet Take 25 mg by mouth 2 (two) times daily.    Historical Provider, MD  levothyroxine (SYNTHROID, LEVOTHROID) 175 MCG tablet Take 1 tablet (175 mcg total) by mouth daily. 12/31/16   Josalyn Funches, MD  losartan (COZAAR) 25 MG tablet Take 1 tablet (25 mg total) by mouth daily. 12/31/16   Josalyn Funches, MD  naproxen (NAPROSYN) 500 MG tablet Take 1 tablet (500 mg total) by mouth 2 (two) times daily with a meal. X 1 week then Prn pain 01/29/17   Argentina Donovan, PA-C  pantoprazole (PROTONIX) 40 MG tablet Take 1 tablet (40 mg total) by mouth daily. 11/19/16   Boykin Nearing, MD  Objective:  EXAM:   Vitals:   01/29/17 1202  BP: (!) 151/98  Pulse: 80  Resp: 16  Temp: 98.1 F (36.7 C)  TempSrc: Oral  SpO2: 96%  Weight: 187 lb 9.6 oz (85.1 kg)    General appearance : A&OX3. NAD. Non-toxic-appearing HEENT: Atraumatic and Normocephalic.  PERRLA. EOM intact.   Neck: supple, no JVD. No cervical lymphadenopathy. No thyromegaly Chest/Lungs:  Breathing-non-labored, Good air entry bilaterally, breath sounds normal without rales, rhonchi, or wheezing  CVS: S1 S2 regular, no murmurs, gallops, rubs  Extremities: Bilateral Lower Ext shows no edema, both legs are warm to touch with = pulse throughout.  r vs L hand  examined.  Normal grip, S&ROM.  NV intact.  No obvious deformities of the joints.   Neurology:  CN II-XII grossly intact, Non focal.   Psych:  TP linear. J/I WNL. Normal speech. Appropriate eye contact and affect.  Skin:  No Rash  Data Review Lab Results  Component Value Date   HGBA1C 5 4 11/21/2015     Assessment & Plan   1. Right hand pain ?arthritis - naproxen (NAPROSYN) 500 MG tablet; Take 1 tablet (500 mg total) by mouth 2 (two) times daily with a meal. X 1 week then Prn pain  Dispense: 60 tablet; Refill: 0 - DG Hand 2 View Right; Future  2. Hypertension, unspecified type Uncontrolled.  Check BP OOO oand record 3-4 times/week.  Bring to next visit.    Patient have been counseled extensively about nutrition and exercise  Return in about 3 weeks (around 02/19/2017) for Dr Adrian Blackwater; f/up htn/depression/hand pain.  The patient was given clear instructions to go to ER or return to medical center if symptoms don't improve, worsen or new problems develop. The patient verbalized understanding. The patient was told to call to get lab results if they haven't heard anything in the next week.     Freeman Caldron, PA-C [facility] and [facility], [place]   01/29/2017, 12:16 PMPatient ID: Darren [name], male   DOB: July 19, 1961, 56 y.o.   MRN: [id number]

## 2017-02-02 ENCOUNTER — Telehealth: Payer: Self-pay | Admitting: Family Medicine

## 2017-02-02 NOTE — Telephone Encounter (Signed)
Pt. Called requesting x-ray results. Pt. Also stated that his neck is swollen. Pt. States that he has no pain but is worried. Please f/u with pt.

## 2017-02-03 MED FILL — ?LEVOTHYROXINE 175 MCG TABL: 175 | 30 days supply | Qty: 30 | Fill #1

## 2017-02-03 NOTE — Telephone Encounter (Signed)
NO RESULTS ARE INB FOR THIS PATIENT YET. PATIENT WILL BE CALLED ONCE RESULTS COME IN.

## 2017-02-05 ENCOUNTER — Telehealth: Payer: Self-pay

## 2017-02-05 ENCOUNTER — Ambulatory Visit: Payer: No Typology Code available for payment source | Attending: Physician Assistant | Admitting: Physician Assistant

## 2017-02-05 VITALS — BP 144/90 | HR 85 | Temp 98.0°F | Resp 16 | Wt 187.6 lb

## 2017-02-05 DIAGNOSIS — E039 Hypothyroidism, unspecified: Secondary | ICD-10-CM | POA: Insufficient documentation

## 2017-02-05 DIAGNOSIS — R221 Localized swelling, mass and lump, neck: Secondary | ICD-10-CM

## 2017-02-05 DIAGNOSIS — F329 Major depressive disorder, single episode, unspecified: Secondary | ICD-10-CM | POA: Insufficient documentation

## 2017-02-05 DIAGNOSIS — J449 Chronic obstructive pulmonary disease, unspecified: Secondary | ICD-10-CM | POA: Insufficient documentation

## 2017-02-05 DIAGNOSIS — I10 Essential (primary) hypertension: Secondary | ICD-10-CM | POA: Insufficient documentation

## 2017-02-05 DIAGNOSIS — K219 Gastro-esophageal reflux disease without esophagitis: Secondary | ICD-10-CM | POA: Insufficient documentation

## 2017-02-05 DIAGNOSIS — I712 Thoracic aortic aneurysm, without rupture: Secondary | ICD-10-CM | POA: Insufficient documentation

## 2017-02-05 DIAGNOSIS — K2270 Barrett's esophagus without dysplasia: Secondary | ICD-10-CM | POA: Diagnosis not present

## 2017-02-05 NOTE — Telephone Encounter (Signed)
Contacted pt to go over xray results pt is aware and doesn't have any questions or concerns  

## 2017-02-05 NOTE — Progress Notes (Signed)
Darren [name], is a 56 y.o. male  RRN:[id number]  FXO:[id number]  DOB - [birth year]  Subjective:  Chief Complaint and HPI: Darren [name] is a 56 y.o. male here today and feels that his throat is full in the area where he previously had a beard.  He has had a beard for years and shaved it last Friday.  He feels that his chin and neck look fuller than in previous years when he has shaved it.  He denies dysphagia.  There is no pain.  No weight loss.  No appetite change.  He has had EGD within the last year without abnormal findings by history.  ROS:   Constitutional:  No f/c, No night sweats, No unexplained weight loss. EENT:  No vision changes, No blurry vision, No hearing changes.  Respiratory: No cough, No SOB Cardiac: No CP, no palpitations GI:  No abd pain, No N/V/D. GU: No Urinary s/sx Musculoskeletal: No joint pain Neuro: No headache, no dizziness, no motor weakness.  Skin: No rash Endocrine:  No polydipsia. No polyuria.  Psych: Denies SI/HI  No problems updated.  ALLERGIES: No Known Allergies  PAST MEDICAL HISTORY: Past Medical History:  Diagnosis Date  . Arthritis    per Medical clearance form.  . Ascending aortic aneurysm ([place]) 06/2016   4.2 cm  . Barrett esophagus 2001   path report in EPIC 2001 c/w Barrett's. endoscopy report unavailable.  Marland Kitchen COPD (chronic obstructive pulmonary disease) ([place])    per medical clearance form.  . Depression    per Medical Clearance form(09/18/11)  . GERD (gastroesophageal reflux disease)    per Medical clearance form.  . Hypertension    Per Medical Clearance form.  . Hypothyroidism    per Medical Clearance form.    MEDICATIONS AT HOME: Prior to Admission medications   Medication Sig Start Date End Date Taking? Authorizing Provider  amitriptyline (ELAVIL) 50 MG tablet Take 1 tablet (50 mg total) by mouth at bedtime. 10/29/16   Funches, Adriana Mccallum, MD  DULoxetine (CYMBALTA) 60 MG capsule Take 1 capsule (60 mg total) by mouth daily.  12/31/16   Funches, Adriana Mccallum, MD  fluticasone (FLONASE) 50 MCG/ACT nasal spray Place 2 sprays into both nostrils daily. Patient taking differently: Place 2 sprays into both nostrils daily as needed for allergies.  08/27/16   Argentina Donovan, PA-C  gabapentin (NEURONTIN) 300 MG capsule Take 300 mg by mouth.    [provider]  hydrOXYzine (ATARAX/VISTARIL) 25 MG tablet Take 25 mg by mouth 2 (two) times daily.    [provider]  levothyroxine (SYNTHROID, LEVOTHROID) 175 MCG tablet Take 1 tablet (175 mcg total) by mouth daily. 12/31/16   Funches, Adriana Mccallum, MD  losartan (COZAAR) 25 MG tablet Take 1 tablet (25 mg total) by mouth daily. 12/31/16   Funches, Adriana Mccallum, MD  naproxen (NAPROSYN) 500 MG tablet Take 1 tablet (500 mg total) by mouth 2 (two) times daily with a meal. X 1 week then Prn pain 01/29/17   Argentina Donovan, PA-C  pantoprazole (PROTONIX) 40 MG tablet Take 1 tablet (40 mg total) by mouth daily. 11/19/16   Funches, Adriana Mccallum, MD     Objective:  EXAM:   Vitals:   02/05/17 1406  BP: (!) 144/90  Pulse: 85  Resp: 16  Temp: 98 F (36.7 C)  TempSrc: Oral  SpO2: 97%  Weight: 187 lb 9.6 oz (85.1 kg)    General appearance : A&OX3. NAD. Non-toxic-appearing HEENT: Atraumatic and Normocephalic.  PERRLA. EOM intact.  TM  clear B. Mouth-MMM, post pharynx WNL w/o erythema, No PND. Neck: supple, appears normal for being overweight.  No discrete masses or enlargement in neck or chin area.  no JVD. No cervical lymphadenopathy. No thyromegaly.  No supraclavicular nodes. Chest/Lungs:  Breathing-non-labored, Good air entry bilaterally, breath sounds normal without rales, rhonchi, or wheezing  CVS: S1 S2 regular, no murmurs, gallops, rubs  Extremities: Bilateral Lower Ext shows no edema, both legs are warm to touch with = pulse throughout Neurology:  CN II-XII grossly intact, Non focal.   Psych:  TP linear. J/I WNL. Normal speech. Appropriate eye contact and affect.  Skin:  No  Rash  Data Review Lab Results  Component Value Date   HGBA1C 5 4 11/21/2015     Assessment & Plan   1. Hypothyroidism, unspecified type - TSH  2. Lump in throat No discreet nodules or masses.  I believe he is noticing the area to be more prominent due to the fact he has had a beard for a long time and is heavier than in years past when he went without a beard.  He has had extensive imaging and EGD in the recent past-further imaging not indicated based on findings today.  Consider further testing if warranted at f/up.   - TSH  Patient have been counseled extensively about nutrition and exercise  Return for keep f/up appt with Dr Adrian Blackwater.  The patient was given clear instructions to go to ER or return to medical center if symptoms don't improve, worsen or new problems develop. The patient verbalized understanding. The patient was told to call to get lab results if they haven't heard anything in the next week.     Freeman Caldron, PA-C [facility] and [facility], [place]   02/05/2017, 2:33 PMPatient ID: Darren [name], male   DOB: [birth year], 56 y.o.   MRN: [id number]

## 2017-02-06 LAB — TSH: TSH: 0.601 u[IU]/mL (ref 0.450–4.500)

## 2017-02-09 ENCOUNTER — Encounter: Payer: Self-pay | Admitting: Physical Medicine & Rehabilitation

## 2017-02-09 ENCOUNTER — Telehealth: Payer: Self-pay

## 2017-02-09 ENCOUNTER — Ambulatory Visit (HOSPITAL_BASED_OUTPATIENT_CLINIC_OR_DEPARTMENT_OTHER): Payer: Self-pay | Admitting: Physical Medicine & Rehabilitation

## 2017-02-09 ENCOUNTER — Encounter: Payer: Self-pay | Attending: Physical Medicine & Rehabilitation

## 2017-02-09 VITALS — BP 138/92 | HR 69 | Resp 14

## 2017-02-09 DIAGNOSIS — G8929 Other chronic pain: Secondary | ICD-10-CM | POA: Insufficient documentation

## 2017-02-09 DIAGNOSIS — M533 Sacrococcygeal disorders, not elsewhere classified: Secondary | ICD-10-CM

## 2017-02-09 DIAGNOSIS — M2580 Other specified joint disorders, unspecified joint: Secondary | ICD-10-CM | POA: Insufficient documentation

## 2017-02-09 DIAGNOSIS — M47896 Other spondylosis, lumbar region: Secondary | ICD-10-CM | POA: Insufficient documentation

## 2017-02-09 DIAGNOSIS — M545 Low back pain: Secondary | ICD-10-CM | POA: Insufficient documentation

## 2017-02-09 NOTE — Telephone Encounter (Signed)
Contacted pt to go over lab results pt didn't answer lvm asking pt to give me a call at his earliest convienence    If pt calls back please give results: thyroid hormone is within therapeutic range and he should continue his current dose of medication

## 2017-02-09 NOTE — Patient Instructions (Signed)
Sacroiliac injection was performed today. A combination of a naming medicine plus a cortisone medicine was injected. The injection was done under x-ray guidance. This procedure has been performed to help reduce low back and buttocks pain as well as potentially hip pain. The duration of this injection is variable lasting from hours to  Months. It may repeated if needed. 

## 2017-02-09 NOTE — Progress Notes (Signed)
  PROCEDURE RECORD [facility]   Name: Darren [name] DOB:1961-03-24 MRN: [id number]  Date:02/09/2017  Physician: Alysia Penna, MD    Nurse/CMA: Ikeem Cleckler, CMA  Allergies: No Known Allergies  Consent Signed: Yes.    Is patient diabetic? No.  CBG today? n/a   Pregnant: No. LMP: No LMP for male patient. (age 56-55)  Anticoagulants: no Anti-inflammatory: no Antibiotics: no  Procedure: bilateral sacroiliac steroid injection Position: Prone Start Time: 1:08pm  End Time: 1:18pm  Fluoro Time: 30  RN/CMA Anmol Paschen, CMA Lenoard Helbert, CMA    Time 12:50pm 1:21pm    BP 138/92 152/106    Pulse 68 73    Respirations 14 14    O2 Sat 95 96    S/S 6 6    Pain Level 8/10 4/10     D/C home with sister, patient A & O X 3, D/C instructions reviewed, and sits independently.

## 2017-02-09 NOTE — Progress Notes (Signed)

## 2017-02-10 ENCOUNTER — Encounter: Payer: Self-pay | Admitting: Family Medicine

## 2017-02-15 MED FILL — PANTOPRAZOLE SOD DR 40 MG T: 40 | 30 days supply | Qty: 30 | Fill #2

## 2017-02-15 MED FILL — LOSARTAN POTASSIUM 25 MG TA: 25 | 30 days supply | Qty: 30 | Fill #1

## 2017-02-25 ENCOUNTER — Ambulatory Visit: Payer: Self-pay | Attending: Family Medicine | Admitting: Family Medicine

## 2017-02-25 ENCOUNTER — Encounter: Payer: Self-pay | Admitting: Family Medicine

## 2017-02-25 VITALS — BP 125/78 | HR 59 | Temp 97.7°F | Wt 187.6 lb

## 2017-02-25 DIAGNOSIS — F4321 Adjustment disorder with depressed mood: Secondary | ICD-10-CM | POA: Insufficient documentation

## 2017-02-25 DIAGNOSIS — G8929 Other chronic pain: Secondary | ICD-10-CM | POA: Insufficient documentation

## 2017-02-25 DIAGNOSIS — E039 Hypothyroidism, unspecified: Secondary | ICD-10-CM | POA: Insufficient documentation

## 2017-02-25 DIAGNOSIS — Z0000 Encounter for general adult medical examination without abnormal findings: Secondary | ICD-10-CM

## 2017-02-25 DIAGNOSIS — K219 Gastro-esophageal reflux disease without esophagitis: Secondary | ICD-10-CM | POA: Insufficient documentation

## 2017-02-25 DIAGNOSIS — K2270 Barrett's esophagus without dysplasia: Secondary | ICD-10-CM | POA: Insufficient documentation

## 2017-02-25 DIAGNOSIS — F419 Anxiety disorder, unspecified: Secondary | ICD-10-CM

## 2017-02-25 DIAGNOSIS — Z79899 Other long term (current) drug therapy: Secondary | ICD-10-CM | POA: Insufficient documentation

## 2017-02-25 DIAGNOSIS — I10 Essential (primary) hypertension: Secondary | ICD-10-CM | POA: Insufficient documentation

## 2017-02-25 DIAGNOSIS — M545 Low back pain: Secondary | ICD-10-CM | POA: Insufficient documentation

## 2017-02-25 DIAGNOSIS — Z87891 Personal history of nicotine dependence: Secondary | ICD-10-CM | POA: Insufficient documentation

## 2017-02-25 MED ORDER — PANTOPRAZOLE SODIUM 40 MG PO TBEC: 40.0000 mg | DELAYED_RELEASE_TABLET | Freq: Every day | ORAL | 5 refills | Status: DC

## 2017-02-25 MED ORDER — HYDROXYZINE HCL 25 MG PO TABS: 25.0000 mg | ORAL_TABLET | Freq: Two times a day (BID) | ORAL | 5 refills | Status: DC

## 2017-02-25 MED ORDER — DULOXETINE HCL 60 MG PO CPEP: 60.0000 mg | ORAL_CAPSULE | Freq: Every day | ORAL | 5 refills | Status: DC
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
## 2017-02-25 NOTE — Patient Instructions (Addendum)
Darren [name] was seen today for hypertension.  Diagnoses and all orders for this visit:  Anxiety -     hydrOXYzine (ATARAX/VISTARIL) 25 MG tablet; Take 1 tablet (25 mg total) by mouth 2 (two) times daily.  Chronic midline low back pain without sciatica -     DULoxetine (CYMBALTA) 60 MG capsule; Take 1 capsule (60 mg total) by mouth daily.  Gastroesophageal reflux disease, esophagitis presence not specified -     pantoprazole (PROTONIX) 40 MG tablet; Take 1 tablet (40 mg total) by mouth daily.  Situational depression -     DULoxetine (CYMBALTA) 60 MG capsule; Take 1 capsule (60 mg total) by mouth daily.  Healthcare maintenance -     Ambulatory referral to Gastroenterology   F/u in 3 months for HTN  Dr. Adrian Blackwater

## 2017-02-26 NOTE — Assessment & Plan Note (Signed)
Chronic pain Patient has returned to work Plan: Continue Cymbalta, gabapentin and prn naproxen

## 2017-03-03 ENCOUNTER — Telehealth: Payer: Self-pay | Admitting: Physical Medicine & Rehabilitation

## 2017-03-03 NOTE — Telephone Encounter (Signed)
Phoned home advised ptn by message CAFA expires [phone] need new documents for [id number] for SI injection

## 2017-03-05 ENCOUNTER — Ambulatory Visit: Payer: Self-pay

## 2017-03-08 MED FILL — ?LEVOTHYROXINE 175 MCG TAB: 175 | 30 days supply | Qty: 30 | Fill #2

## 2017-03-18 ENCOUNTER — Other Ambulatory Visit: Payer: Self-pay

## 2017-03-18 ENCOUNTER — Ambulatory Visit: Payer: Self-pay | Admitting: Hematology

## 2017-03-19 ENCOUNTER — Ambulatory Visit: Payer: Self-pay | Admitting: Physical Medicine & Rehabilitation

## 2017-03-22 MED FILL — ?PANTOPRAZOLE SOD DR 40MG: 40 MG | 30 days supply | Qty: 30 | Fill #3

## 2017-03-22 MED FILL — LOSARTAN POTASSIUM 25 MG TA: 25 | 30 days supply | Qty: 30 | Fill #2

## 2017-03-24 ENCOUNTER — Ambulatory Visit: Payer: Self-pay | Attending: Family Medicine

## 2017-04-08 MED FILL — ?LEVOTHYROXINE 175 MCG TABL: 175 | 30 days supply | Qty: 30 | Fill #3

## 2017-04-20 ENCOUNTER — Encounter: Payer: Self-pay | Admitting: Family Medicine

## 2017-04-20 ENCOUNTER — Ambulatory Visit: Payer: Self-pay | Attending: Family Medicine | Admitting: Family Medicine

## 2017-04-20 VITALS — BP 109/76 | HR 76 | Temp 97.8°F | Wt 180.4 lb

## 2017-04-20 DIAGNOSIS — K219 Gastro-esophageal reflux disease without esophagitis: Secondary | ICD-10-CM

## 2017-04-20 DIAGNOSIS — M25532 Pain in left wrist: Secondary | ICD-10-CM | POA: Insufficient documentation

## 2017-04-20 DIAGNOSIS — M25531 Pain in right wrist: Secondary | ICD-10-CM

## 2017-04-20 DIAGNOSIS — R195 Other fecal abnormalities: Secondary | ICD-10-CM | POA: Insufficient documentation

## 2017-04-20 DIAGNOSIS — G8929 Other chronic pain: Secondary | ICD-10-CM | POA: Insufficient documentation

## 2017-04-20 DIAGNOSIS — Z79899 Other long term (current) drug therapy: Secondary | ICD-10-CM | POA: Insufficient documentation

## 2017-04-20 DIAGNOSIS — K2270 Barrett's esophagus without dysplasia: Secondary | ICD-10-CM | POA: Insufficient documentation

## 2017-04-20 DIAGNOSIS — E039 Hypothyroidism, unspecified: Secondary | ICD-10-CM | POA: Insufficient documentation

## 2017-04-20 DIAGNOSIS — M545 Low back pain: Secondary | ICD-10-CM | POA: Insufficient documentation

## 2017-04-20 DIAGNOSIS — I10 Essential (primary) hypertension: Secondary | ICD-10-CM

## 2017-04-20 DIAGNOSIS — Z87891 Personal history of nicotine dependence: Secondary | ICD-10-CM | POA: Insufficient documentation

## 2017-04-20 DIAGNOSIS — Z0000 Encounter for general adult medical examination without abnormal findings: Secondary | ICD-10-CM

## 2017-04-20 MED ORDER — LOSARTAN POTASSIUM 25 MG PO TABS: 25.0000 mg | ORAL_TABLET | Freq: Every day | ORAL | 3 refills | Status: DC

## 2017-04-20 MED ORDER — PANTOPRAZOLE SODIUM 40 MG PO TBEC: 40.0000 mg | DELAYED_RELEASE_TABLET | Freq: Every day | ORAL | 3 refills | Status: DC

## 2017-04-20 NOTE — Patient Instructions (Addendum)
Darren [name] was seen today for hand pain.  Diagnoses and all orders for this visit:  Healthcare maintenance -     Fecal occult blood, imunochemical  Hypertension, unspecified type -     losartan (COZAAR) 25 MG tablet; Take 1 tablet (25 mg total) by mouth daily.  Gastroesophageal reflux disease, esophagitis presence not specified -     pantoprazole (PROTONIX) 40 MG tablet; Take 1 tablet (40 mg total) by mouth daily.   Take Excedrin 2 pills twice daily   An alternative is  Acetaminophen/Tylenol 1000 mg twice daily  Naproxen 500 mg twice daily with food   For free colon cancer screening: Please complete home stool specimen collection and mail the kit. This is free screening.  You will receive at $10 gift card once the results are obtained.   F/u in 8 weeks for hand pains   Dr. Adrian Blackwater

## 2017-04-20 NOTE — Progress Notes (Signed)
Subjective:  Patient ID: Darren [name], male    DOB: September 29, 1960  Age: 56 y.o. MRN: [id number]  CC: Hand Pain   HPI ESHAN TRUPIANO has GERD, hypothyroidism, chronic low back pain, Barrett esophagus  he presents for   1. Hand pain: he reports pain in both hands particularly at the wrist and thenar eminence. He denies hand swelling. Taking Excedrin for pain currently. He is working in a [employer]. His work requires repetitive use of his hands. Pain is increased after work and after playing his drums.   Med refills: request losartan and protonix. Compliant with losartan. No HA, CP or SOB.  Social History  Substance Use Topics  . Smoking status: Former Smoker    Packs/day: 1.00    Years: 26.00    Types: Cigarettes    Quit date: 09/15/2004  . Smokeless tobacco: Never Used     Comment: Quit in 2005  . Alcohol use Yes     Comment: ON WEEKENDS   Outpatient Medications Prior to Visit  Medication Sig Dispense Refill  . DULoxetine (CYMBALTA) 60 MG capsule Take 1 capsule (60 mg total) by mouth daily. 30 capsule 5  . fluticasone (FLONASE) 50 MCG/ACT nasal spray Place 2 sprays into both nostrils daily. (Patient taking differently: Place 2 sprays into both nostrils daily as needed for allergies. ) 16 g 6  . gabapentin (NEURONTIN) 300 MG capsule Take 300 mg by mouth.    . hydrOXYzine (ATARAX/VISTARIL) 25 MG tablet Take 1 tablet (25 mg total) by mouth 2 (two) times daily. 60 tablet 5  . levothyroxine (SYNTHROID, LEVOTHROID) 175 MCG tablet Take 1 tablet (175 mcg total) by mouth daily. 30 tablet 5  . losartan (COZAAR) 25 MG tablet Take 1 tablet (25 mg total) by mouth daily. 30 tablet 5  . naproxen (NAPROSYN) 500 MG tablet Take 1 tablet (500 mg total) by mouth 2 (two) times daily with a meal. X 1 week then Prn pain 60 tablet 0  . pantoprazole (PROTONIX) 40 MG tablet Take 1 tablet (40 mg total) by mouth daily. 30 tablet 5   No facility-administered medications prior to visit.     ROS Review of  Systems  Constitutional: Negative for chills, fatigue, fever and unexpected weight change.  HENT: Negative for sinus pain, sinus pressure and sneezing.   Eyes: Negative for visual disturbance.  Respiratory: Negative for cough and shortness of breath.   Cardiovascular: Negative for chest pain, palpitations and leg swelling.  Gastrointestinal: Negative for abdominal pain (L hand), blood in stool, constipation, diarrhea, nausea and vomiting.  Endocrine: Negative for polydipsia, polyphagia and polyuria.  Genitourinary: Negative for dysuria, frequency and hematuria.  Musculoskeletal: Positive for arthralgias and back pain. Negative for gait problem, myalgias and neck pain.  Skin: Negative for rash.  Allergic/Immunologic: Negative for immunocompromised state.  Hematological: Negative for adenopathy. Does not bruise/bleed easily.  Psychiatric/Behavioral: Positive for dysphoric mood. Negative for sleep disturbance and suicidal ideas. The patient is nervous/anxious.     Objective:  BP 109/76   Pulse 76   Temp 97.8 F (36.6 C) (Oral)   Wt 180 lb 6.4 oz (81.8 kg)   SpO2 96%   BMI 31.96 kg/m   BP/Weight 04/20/2017 02/25/2017 4/40/1027  Systolic BP 253 664 403  Diastolic BP 76 78 92  Wt. (Lbs) 180.4 187.6 -  BMI 31.96 33.23 -    Physical Exam  Constitutional: He appears well-developed and well-nourished. No distress.  HENT:  Head: Normocephalic and atraumatic.  Right Ear:  External ear normal.  Left Ear: External ear normal.  Ears:  Nose: Nose normal.  Mouth/Throat: Oropharynx is clear and moist. No oropharyngeal exudate.  Neck: Normal range of motion. Neck supple. No thyromegaly present.  Cardiovascular: Normal rate, regular rhythm, normal heart sounds and intact distal pulses.   Pulmonary/Chest: Effort normal and breath sounds normal.  Musculoskeletal: He exhibits no edema.       Right hand: He exhibits tenderness and bony tenderness. He exhibits normal range of motion, no deformity,  no laceration and no swelling.       Left hand: He exhibits tenderness and bony tenderness. He exhibits normal range of motion, no deformity, no laceration and no swelling.  Lymphadenopathy:    He has no cervical adenopathy.  Neurological: He is alert.  Skin: Skin is warm and dry. No rash noted. No erythema.  Psychiatric: He has a normal mood and affect.    Depression screen [facility] 2/9 02/25/2017 01/29/2017 12/31/2016 12/21/2016 11/19/2016  Decreased Interest 0 0 0 2 0  Down, Depressed, Hopeless 1 3 3 3 1   PHQ - 2 Score 1 3 3 5 1   Altered sleeping 0 3 3 2 1   Tired, decreased energy 2 1 2 2 1   Change in appetite 0 2 1 0 0  Feeling bad or failure about yourself  0 0 0 0 0  Trouble concentrating 0 1 1 0 2  Moving slowly or fidgety/restless 0 0 0 0 0  Suicidal thoughts 0 0 0 0 0  PHQ-9 Score 3 10 10 9 5   Difficult doing work/chores - - - Somewhat difficult -  Some recent data might be hidden   GAD 7 : Generalized Anxiety Score 02/25/2017 01/29/2017 12/31/2016 11/19/2016  Nervous, Anxious, on Edge 0 0 1 1  Control/stop worrying 3 3 3 1   Worry too much - different things 3 3 3 1   Trouble relaxing 0 1 2 2   Restless 0 0 0 0  Easily annoyed or irritable 1 1 2  0  Afraid - awful might happen 1 2 3 1   Total GAD 7 Score 8 10 14 6    Lab Results  Component Value Date   TSH 0.601 02/05/2017     Assessment & Plan:  Rodricus was seen today for hand pain.  Diagnoses and all orders for this visit:  Healthcare maintenance -     Fecal occult blood, imunochemical  Hypertension, unspecified type -     Discontinue: losartan (COZAAR) 25 MG tablet; Take 1 tablet (25 mg total) by mouth daily. -     losartan (COZAAR) 25 MG tablet; Take 1 tablet (25 mg total) by mouth daily.  Gastroesophageal reflux disease, esophagitis presence not specified -     Discontinue: pantoprazole (PROTONIX) 40 MG tablet; Take 1 tablet (40 mg total) by mouth daily. -     pantoprazole (PROTONIX) 40 MG tablet; Take 1 tablet (40 mg  total) by mouth daily.  Bilateral wrist pain -     DG Wrist 2 Views Right; Future -     DG Wrist 2 Views Left; Future  There are no diagnoses linked to this encounter.  No orders of the defined types were placed in this encounter.   Follow-up: Return in about 8 weeks (around 06/15/2017).   Boykin Nearing MD

## 2017-04-21 MED FILL — LOSARTAN POTASSIUM 25 MG TA: 25 | 30 days supply | Qty: 30 | Fill #0

## 2017-04-21 MED FILL — ?PANTOPRAZOLE SOD DR 40MG: 40 MG | 30 days supply | Qty: 30 | Fill #0

## 2017-04-22 DIAGNOSIS — M25531 Pain in right wrist: Secondary | ICD-10-CM | POA: Insufficient documentation

## 2017-04-22 DIAGNOSIS — M25532 Pain in left wrist: Secondary | ICD-10-CM | POA: Insufficient documentation

## 2017-04-22 NOTE — Assessment & Plan Note (Addendum)
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

## 2017-05-05 MED FILL — LEVOTHYROXINE 175 MCG TABLE: 175 | 30 days supply | Qty: 30 | Fill #4

## 2017-05-25 MED FILL — LOSARTAN POTASSIUM 25 MG TA: 25 | 30 days supply | Qty: 30 | Fill #1

## 2017-05-25 MED FILL — ?PANTOPRAZOLE SOD DR 40MG: 40 MG | 30 days supply | Qty: 30 | Fill #1

## 2017-05-27 ENCOUNTER — Ambulatory Visit: Payer: Self-pay | Admitting: Internal Medicine

## 2017-06-08 ENCOUNTER — Other Ambulatory Visit: Payer: Self-pay | Admitting: Family Medicine

## 2017-06-08 DIAGNOSIS — E039 Hypothyroidism, unspecified: Secondary | ICD-10-CM

## 2017-06-08 MED FILL — LEVOTHYROXINE 175 MCG TABLE: 175 | 30 days supply | Qty: 30 | Fill #5

## 2017-06-21 ENCOUNTER — Ambulatory Visit: Payer: Self-pay | Admitting: Internal Medicine

## 2017-06-29 MED FILL — ?PANTOPRAZOLE SOD DR 40MG: 40 MG | 30 days supply | Qty: 30 | Fill #2

## 2017-06-29 MED FILL — LOSARTAN POTASSIUM 25 MG TA: 25 | 30 days supply | Qty: 30 | Fill #2

## 2017-07-12 ENCOUNTER — Telehealth: Payer: Self-pay

## 2017-07-12 DIAGNOSIS — E039 Hypothyroidism, unspecified: Secondary | ICD-10-CM

## 2017-07-12 DIAGNOSIS — I10 Essential (primary) hypertension: Secondary | ICD-10-CM

## 2017-07-12 NOTE — Telephone Encounter (Signed)
PT called the office and is requesting his synthroid and losartan. Pt states he would like his rx sent to [facility]. Please f/u

## 2017-07-13 MED ORDER — LOSARTAN POTASSIUM 25 MG PO TABS: 25.0000 mg | ORAL_TABLET | Freq: Every day | ORAL | 0 refills | Status: DC

## 2017-07-13 MED ORDER — LEVOTHYROXINE SODIUM 175 MCG PO TABS: 175.0000 ug | ORAL_TABLET | Freq: Every day | ORAL | 0 refills | Status: DC

## 2017-07-13 MED FILL — LEVOTHYROXINE 175 MCG TABLE: 175 | 30 days supply | Qty: 30 | Fill #0

## 2017-07-13 NOTE — Telephone Encounter (Signed)
Refilled requested medications x 30 days - patient needs office visit to establish with new provider.

## 2017-07-23 ENCOUNTER — Other Ambulatory Visit: Payer: Self-pay | Admitting: *Deleted

## 2017-07-23 DIAGNOSIS — I712 Thoracic aortic aneurysm, without rupture, unspecified: Secondary | ICD-10-CM

## 2017-08-02 MED FILL — LOSARTAN POTASSIUM 25 MG TA: 25 | 30 days supply | Qty: 30 | Fill #3

## 2017-08-02 MED FILL — ?PANTOPRAZOLE SOD DR 40MG: 40 MG | 30 days supply | Qty: 30 | Fill #3

## 2017-08-16 ENCOUNTER — Ambulatory Visit: Payer: Self-pay | Attending: Internal Medicine

## 2017-08-17 ENCOUNTER — Encounter: Payer: Self-pay | Admitting: Internal Medicine

## 2017-08-17 ENCOUNTER — Ambulatory Visit: Payer: Self-pay | Attending: Internal Medicine | Admitting: Internal Medicine

## 2017-08-17 DIAGNOSIS — M545 Low back pain, unspecified: Secondary | ICD-10-CM

## 2017-08-17 DIAGNOSIS — G8929 Other chronic pain: Secondary | ICD-10-CM

## 2017-08-17 DIAGNOSIS — Z23 Encounter for immunization: Secondary | ICD-10-CM

## 2017-08-17 DIAGNOSIS — M549 Dorsalgia, unspecified: Secondary | ICD-10-CM | POA: Insufficient documentation

## 2017-08-17 DIAGNOSIS — Z801 Family history of malignant neoplasm of trachea, bronchus and lung: Secondary | ICD-10-CM | POA: Insufficient documentation

## 2017-08-17 DIAGNOSIS — E039 Hypothyroidism, unspecified: Secondary | ICD-10-CM

## 2017-08-17 DIAGNOSIS — Z823 Family history of stroke: Secondary | ICD-10-CM | POA: Insufficient documentation

## 2017-08-17 DIAGNOSIS — Z79899 Other long term (current) drug therapy: Secondary | ICD-10-CM | POA: Insufficient documentation

## 2017-08-17 DIAGNOSIS — Z9889 Other specified postprocedural states: Secondary | ICD-10-CM | POA: Insufficient documentation

## 2017-08-17 DIAGNOSIS — J449 Chronic obstructive pulmonary disease, unspecified: Secondary | ICD-10-CM | POA: Insufficient documentation

## 2017-08-17 DIAGNOSIS — I10 Essential (primary) hypertension: Secondary | ICD-10-CM

## 2017-08-17 DIAGNOSIS — Z87891 Personal history of nicotine dependence: Secondary | ICD-10-CM | POA: Insufficient documentation

## 2017-08-17 DIAGNOSIS — K219 Gastro-esophageal reflux disease without esophagitis: Secondary | ICD-10-CM | POA: Insufficient documentation

## 2017-08-17 MED ORDER — LEVOTHYROXINE SODIUM 175 MCG PO TABS: 175.0000 ug | ORAL_TABLET | Freq: Every day | ORAL | 6 refills | Status: DC

## 2017-08-17 MED ORDER — DICLOFENAC SODIUM 1 % TD GEL: 2.0000 g | Freq: Four times a day (QID) | TRANSDERMAL | 5 refills | Status: DC

## 2017-08-17 MED FILL — DICLOFENAC SODIUM 1% GEL: 1 | 12 days supply | Qty: 100 | Fill #0

## 2017-08-17 MED FILL — LEVOTHYROXINE 175 MCG TABLE: 175 | 30 days supply | Qty: 30 | Fill #0

## 2017-08-17 NOTE — Progress Notes (Signed)
Patient ID: Darren [name], male    DOB: 05/04/1961  MRN: [id number]  CC: chronic back pain   Subjective: Darren [name] is a 56 y.o. male who presents for chronic ds management.  Last saw Dr. Adrian Blackwater 03/2017 His concerns today include:  Pt with hx of GERD, hypothyroid, chronic LBP and Barrett's esophagus  On last visit he c/o pain in hands/wrists.Does a lot of repetitive motion of hands at work and also plays the drums. X-rays negative but treated as arthritis with tylenol 1 gam BID and Naprosyn. Hands not as bothersome now  Chronic LBP: injured at work 2 yrs ago while working for a [employer] at at [place] at the time chowed slip disc. Was seeing ortho and had inj to the back a few times and did P.T.  -no radiation to legs. No numbness or tingling. Worse with prolong standing and walking -does a lot of standing at work -8 hr shifts Using Tylenol BID AND BIOFREEZE. The latter he finds very helpful  Thyroid: compliant with Levothyroxine  HTN: compliant with Cozaar. No CP/SOB/LE edema/HA Patient Active Problem List   Diagnosis Date Noted  . Bilateral wrist pain 04/22/2017  . Disorder of left sacroiliac joint 12/21/2016  . Spondylosis without myelopathy or radiculopathy, lumbar region 12/21/2016  . Lumbar degenerative disc disease 12/21/2016  . Leukocytosis 11/23/2016  . Nonspecific abnormal electrocardiogram (ECG) (EKG) 09/18/2016  . Abnormal CT scan, esophagus 08/24/2016  . COPD (chronic obstructive pulmonary disease) with emphysema ([place]) 07/31/2016  . HTN (hypertension) 07/31/2016  . 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 Odynophagia 07/09/2016  . Esophageal dysphagia 07/09/2016  . Chronic midline low back pain 07/09/2016  . Sensorineural hearing loss (SNHL), bilateral 03/30/2016  . Vitamin D deficiency 04/06/2014  . Hypothyroidism 10/31/2013  . GERD (gastroesophageal reflux disease) 11/25/2011  . Barrett esophagus 09/29/1999     Current  Outpatient Medications on File Prior to Visit  Medication Sig Dispense Refill  . fluticasone (FLONASE) 50 MCG/ACT nasal spray Place 2 sprays into both nostrils daily. (Patient taking differently: Place 2 sprays into both nostrils daily as needed for allergies. ) 16 g 6  . gabapentin (NEURONTIN) 300 MG capsule Take 300 mg by mouth.    . hydrOXYzine (ATARAX/VISTARIL) 25 MG tablet Take 1 tablet (25 mg total) by mouth 2 (two) times daily. 60 tablet 5  . losartan (COZAAR) 25 MG tablet Take 1 tablet (25 mg total) by mouth daily. 30 tablet 0  . naproxen (NAPROSYN) 500 MG tablet Take 1 tablet (500 mg total) by mouth 2 (two) times daily with a meal. X 1 week then Prn pain 60 tablet 0  . pantoprazole (PROTONIX) 40 MG tablet Take 1 tablet (40 mg total) by mouth daily. 90 tablet 3   No current facility-administered medications on file prior to visit.     No Known Allergies  Social History   Socioeconomic History  . Marital status: Single    Spouse name: Not on file  . Number of children: 2  . Years of education: Not on file  . Highest education level: Not on file  Social Needs  . Financial resource strain: Not on file  . Food insecurity - worry: Not on file  . Food insecurity - inability: Not on file  . Transportation needs - medical: Not on file  . Transportation needs - non-medical: Not on file  Occupational History  . Not on file  Tobacco Use  . Smoking status: Former  Smoker    Packs/day: 1.00    Years: 26.00    Pack years: 26.00    Types: Cigarettes    Last attempt to quit: 09/15/2004    Years since quitting: 12.9  . Smokeless tobacco: Never Used  . Tobacco comment: Quit in 2005  Substance and Sexual Activity  . Alcohol use: Yes    Comment: ON WEEKENDS  . Drug use: No  . Sexual activity: No    Birth control/protection: None  Other Topics Concern  . Not on file  Social History Narrative   Lives alone.      Family History  Problem Relation Age of Onset  . Cancer Father         lung  . Stroke Father   . Heart attack Father 47  . Lung cancer Mother   . Thyroid disease Sister   . Colon cancer Neg Hx   . Esophageal cancer Neg Hx     Past Surgical History:  Procedure Laterality Date  . BIOPSY  09/18/2016   Procedure: BIOPSY;  Surgeon: Daneil Dolin, MD;  [facility];  Service: Endoscopy;;  biopsy of distal esophagus  . ESOPHAGOGASTRODUODENOSCOPY  2001   barrett's per path  . ESOPHAGOGASTRODUODENOSCOPY (EGD) WITH PROPOFOL N/A 09/18/2016   Procedure: ESOPHAGOGASTRODUODENOSCOPY (EGD) WITH PROPOFOL;  Surgeon: Daneil Dolin, MD;  Location: [facility];  Service: Endoscopy;  Laterality: N/A;  1200  . [place]  . MULTIPLE EXTRACTIONS WITH ALVEOLOPLASTY  11/30/2011   Procedure: MULTIPLE EXTRACION WITH ALVEOLOPLASTY;  Surgeon: Gae Bon, DDS;  Location: [place];  Service: Oral Surgery;  Laterality: Bilateral;    ROS: Review of Systems Neg except as above  PHYSICAL EXAM: BP 123/74 (BP Location: Left Arm, Patient Position: Sitting, Cuff Size: Normal)   Pulse (!) 54   Temp 97.8 F (36.6 C) (Oral)   Resp 18   Ht 5\' 3"  (1.6 m)   Wt 177 lb (80.3 kg)   SpO2 96%   BMI 31.35 kg/m   Physical Exam  General appearance - alert, well appearing, middle age caucasian male and in no distress Mental status - alert, oriented to person, place, and time, normal mood, behavior, speech, dress, motor activity, and thought processes Eyes - pink conjunctiva Mouth - mucous membranes moist, pharynx normal without lesions Neck - supple, no significant adenopathy Chest - clear to auscultation, no wheezes, rales or rhonchi, symmetric air entry Heart - normal rate, regular rhythm, normal S1, S2, no murmurs, rubs, clicks or gallops Musculoskeletal -gait normal. No tenderness on palpation of LS spine. Power in LEs 5/5 BL Extremities -no LE edema  ASSESSMENT AND PLAN: 1. Chronic bilateral low back pain without sciatica Recommend stretch  exercises, heating pad. He has not been able to find Biofreeze lately in stores so will prescribe some Voltaren gel instead - diclofenac sodium (VOLTAREN) 1 % GEL; Apply 2 g topically 4 (four) times daily.  Dispense: 100 g; Refill: 5  2. Essential hypertension -at goal. Cont Cozzar  3. Hypothyroidism, unspecified type - levothyroxine (SYNTHROID, LEVOTHROID) 175 MCG tablet; Take 1 tablet (175 mcg total) by mouth daily.  Dispense: 30 tablet; Refill: 6  4. Need for influenza vaccination - Flu Vaccine QUAD 6+ mos PF IM (Fluarix Quad PF)  Patient was given the opportunity to ask questions.  Patient verbalized understanding of the plan and was able to repeat key elements of the plan.   Orders Placed This Encounter  Procedures  .  Flu Vaccine QUAD 6+ mos PF IM (Fluarix Quad PF)     Requested Prescriptions   Signed Prescriptions Disp Refills  . diclofenac sodium (VOLTAREN) 1 % GEL 100 g 5    Sig: Apply 2 g topically 4 (four) times daily.  Marland Kitchen levothyroxine (SYNTHROID, LEVOTHROID) 175 MCG tablet 30 tablet 6    Sig: Take 1 tablet (175 mcg total) by mouth daily.    Return in about 4 months (around 12/15/2017).  Karle Plumber, MD, FACP

## 2017-08-25 ENCOUNTER — Other Ambulatory Visit: Payer: Self-pay

## 2017-08-25 ENCOUNTER — Ambulatory Visit: Payer: Self-pay | Admitting: Surgery

## 2017-09-15 MED FILL — ?PANTOPRAZOLE SOD DR 40MG: 40 MG | 30 days supply | Qty: 30 | Fill #4

## 2017-09-15 MED FILL — LEVOTHYROXINE 175 MCG TABLE: 175 | 30 days supply | Qty: 30 | Fill #1

## 2017-09-17 ENCOUNTER — Telehealth: Payer: Self-pay | Admitting: Internal Medicine

## 2017-09-17 DIAGNOSIS — J069 Acute upper respiratory infection, unspecified: Secondary | ICD-10-CM

## 2017-09-17 MED ORDER — FLUTICASONE PROPIONATE 50 MCG/ACT NA SUSP: 2.0000 | Freq: Every day | NASAL | 0 refills | Status: DC

## 2017-09-17 MED FILL — DICLOFENAC SODIUM 1% GEL: 1 | 12 days supply | Qty: 100 | Fill #1

## 2017-09-17 NOTE — Telephone Encounter (Signed)
Pt. Came to facility requesting a refill on fluticasone (FLONASE) 50 MCG/ACT nasal spray  Pt. Uses [place] pharmacy. Please f/u with pt.

## 2017-09-29 MED FILL — FLUTICASONE PROP 50 MCG SPR: 50 | 30 days supply | Qty: 16 | Fill #0

## 2017-10-05 ENCOUNTER — Encounter: Payer: Self-pay | Admitting: Family Medicine

## 2017-10-05 ENCOUNTER — Ambulatory Visit: Payer: Self-pay | Attending: Family Medicine | Admitting: Family Medicine

## 2017-10-05 VITALS — BP 133/81 | HR 98 | Temp 99.3°F | Resp 18 | Ht 63.0 in | Wt 173.0 lb

## 2017-10-05 DIAGNOSIS — Z87898 Personal history of other specified conditions: Secondary | ICD-10-CM

## 2017-10-05 DIAGNOSIS — R040 Epistaxis: Secondary | ICD-10-CM | POA: Insufficient documentation

## 2017-10-05 DIAGNOSIS — K529 Noninfective gastroenteritis and colitis, unspecified: Secondary | ICD-10-CM | POA: Insufficient documentation

## 2017-10-05 DIAGNOSIS — Z79899 Other long term (current) drug therapy: Secondary | ICD-10-CM | POA: Insufficient documentation

## 2017-10-05 DIAGNOSIS — I10 Essential (primary) hypertension: Secondary | ICD-10-CM | POA: Insufficient documentation

## 2017-10-05 MED ORDER — SALINE SPRAY 0.65 % NA SOLN: 1.0000 | NASAL | 0 refills | Status: DC | PRN

## 2017-10-05 MED ORDER — LOPERAMIDE HCL 2 MG PO TABS: 2.0000 mg | ORAL_TABLET | Freq: Four times a day (QID) | ORAL | 0 refills | Status: DC | PRN

## 2017-10-05 MED ORDER — LOSARTAN POTASSIUM 25 MG PO TABS
25.0000 mg | ORAL_TABLET | Freq: Every day | ORAL | 2 refills | Status: DC
Start: 2017-10-05 — End: 2018-03-11

## 2017-10-05 MED ORDER — ONDANSETRON HCL 4 MG PO TABS: 4.0000 mg | ORAL_TABLET | Freq: Three times a day (TID) | ORAL | 0 refills | Status: DC | PRN

## 2017-10-05 MED ORDER — NASAL MOIST NA GEL: NASAL | 0 refills | Status: DC

## 2017-10-05 MED FILL — ?ONDANSETRON HCL 4MG TABLET: 4 | 6 days supply | Qty: 20 | Fill #0

## 2017-10-05 MED FILL — LOSARTAN POTASSIUM 25 MG TA: 25 | 30 days supply | Qty: 30 | Fill #0

## 2017-10-05 MED FILL — ?LOPERAMIDE 2 MG CAPSULE: 2 | 7 days supply | Qty: 30 | Fill #0

## 2017-10-05 NOTE — Progress Notes (Signed)
Subjective:  Patient ID: Darren [name], male    DOB: September 08, 1961  Age: 57 y.o. MRN: [id number]  CC: Diarrhea; Emesis; and Epistaxis   HPI LARON BOORMAN presents for GI complaint. Onset yesterday of N/V/D and poor appetitie. He reports about 6 to 7 episodes of emesis and 5 episodes of diarrheas since yesterday. He denies any abdominal pain, sick contacts, alcohol use, or new foods. He denies any hematemesis, hematochezia, or melena. He c/o epistaxis. Right nare. Episode occurred 2 weeks ago. Once Sunday night and Monday twice. Episodes lasted 3 to 4 minutes. He denies any history of nasal trauma or anticoagulant use. He reports not taking his BP medications. He denies any cardiac symptoms.    Outpatient Medications Prior to Visit  Medication Sig Dispense Refill  . diclofenac sodium (VOLTAREN) 1 % GEL Apply 2 g topically 4 (four) times daily. 100 g 5  . fluticasone (FLONASE) 50 MCG/ACT nasal spray Place 2 sprays into both nostrils daily. 16 g 0  . gabapentin (NEURONTIN) 300 MG capsule Take 300 mg by mouth.    . hydrOXYzine (ATARAX/VISTARIL) 25 MG tablet Take 1 tablet (25 mg total) by mouth 2 (two) times daily. 60 tablet 5  . levothyroxine (SYNTHROID, LEVOTHROID) 175 MCG tablet Take 1 tablet (175 mcg total) by mouth daily. 30 tablet 6  . naproxen (NAPROSYN) 500 MG tablet Take 1 tablet (500 mg total) by mouth 2 (two) times daily with a meal. X 1 week then Prn pain 60 tablet 0  . pantoprazole (PROTONIX) 40 MG tablet Take 1 tablet (40 mg total) by mouth daily. 90 tablet 3  . losartan (COZAAR) 25 MG tablet Take 1 tablet (25 mg total) by mouth daily. 30 tablet 0   No facility-administered medications prior to visit.     ROS Review of Systems  Constitutional: Negative.   HENT: Positive for nosebleeds.   Respiratory: Negative.   Cardiovascular: Negative.   Gastrointestinal: Positive for diarrhea, nausea and vomiting.   Objective:  BP 133/81 (BP Location: Left Arm, Patient Position: Sitting,  Cuff Size: Normal)   Pulse 98   Temp 99.3 F (37.4 C) (Oral)   Resp 18   Ht 5\' 3"  (1.6 m)   Wt 173 lb (78.5 kg)   SpO2 97%   BMI 30.65 kg/m   BP/Weight 10/05/2017 08/17/2017 9/37/1696  Systolic BP 789 381 017  Diastolic BP 81 74 76  Wt. (Lbs) 173 177 180.4  BMI 30.65 31.35 31.96     Physical Exam  Constitutional: He appears well-developed and well-nourished.  HENT:  Head: Normocephalic and atraumatic.  Right Ear: External ear normal.  Left Ear: External ear normal.  Nose: Nose normal.  Mouth/Throat: Oropharynx is clear and moist. Mucous membranes are pale and dry.  Eyes: Conjunctivae are normal. Pupils are equal, round, and reactive to light.  Neck: Normal range of motion. Neck supple.  Cardiovascular: Normal rate, regular rhythm, normal heart sounds and intact distal pulses.  Pulmonary/Chest: Effort normal and breath sounds normal.  Abdominal: Soft. Bowel sounds are normal. There is tenderness (throughout).  Lymphadenopathy:    He has no cervical adenopathy.  Skin: Skin is warm and dry.  Psychiatric: He has a normal mood and affect.  Nursing note and vitals reviewed.   Assessment & Plan:   1. Gastroenteritis Precautions given. Increase fluid intake. Clear liquid diet 12 to 24 hours then bland diet next few days.  - ondansetron (ZOFRAN) 4 MG tablet; Take 1 tablet (4 mg total) by mouth  every 8 (eight) hours as needed for nausea or vomiting.  Dispense: 20 tablet; Refill: 0 - loperamide (IMODIUM A-D) 2 MG tablet; Take 1 tablet (2 mg total) by mouth 4 (four) times daily as needed for diarrhea or loose stools.  Dispense: 30 tablet; Refill: 0 - Stool culture; Future - CMP and Liver - CBC  2. Hypertension, unspecified type  - losartan (COZAAR) 25 MG tablet; Take 1 tablet (25 mg total) by mouth daily.  Dispense: 30 tablet; Refill: 2  3. History of epistaxis  - sodium chloride (OCEAN) 0.65 % SOLN nasal spray; Place 1 spray into both nostrils as needed for congestion.;  Refill: 0 - Propyl Glycol-Hydroxyethylcell (NASAL MOIST) GEL; Use as directed.; Refill: 0      Follow-up: Return in about 2 weeks (around 10/19/2017) for Gastroenteritis .   Alfonse Spruce FNP

## 2017-10-05 NOTE — Patient Instructions (Addendum)
If symptoms of vomiting and/or diarrhea worsen or fail to improve within 24-48 hours go to the ED.  Food Choices to Help Relieve Diarrhea, Adult When you have diarrhea, the foods you eat and your eating habits are very important. Choosing the right foods and drinks can help:  Relieve diarrhea.  Replace lost fluids and nutrients.  Prevent dehydration.  What general guidelines should I follow? Relieving diarrhea  Choose foods with less than 2 g or .07 oz. of fiber per serving.  Limit fats to less than 8 tsp (38 g or 1.34 oz.) a day.  Avoid the following: ? Foods and beverages sweetened with high-fructose corn syrup, honey, or sugar alcohols such as xylitol, sorbitol, and mannitol. ? Foods that contain a lot of fat or sugar. ? Fried, greasy, or spicy foods. ? High-fiber grains, breads, and cereals. ? Raw fruits and vegetables.  Eat foods that are rich in probiotics. These foods include dairy products such as yogurt and fermented milk products. They help increase healthy bacteria in the stomach and intestines (gastrointestinal tract, or GI tract).  If you have lactose intolerance, avoid dairy products. These may make your diarrhea worse.  Take medicine to help stop diarrhea (antidiarrheal medicine) only as told by your health care provider. Replacing nutrients  Eat small meals or snacks every 3-4 hours.  Eat bland foods, such as white rice, toast, or baked potato, until your diarrhea starts to get better. Gradually reintroduce nutrient-rich foods as tolerated or as told by your health care provider. This includes: ? Well-cooked protein foods. ? Peeled, seeded, and soft-cooked fruits and vegetables. ? Low-fat dairy products.  Take vitamin and mineral supplements as told by your health care provider. Preventing dehydration   Start by sipping water or a special solution to prevent dehydration (oral rehydration solution, ORS). Urine that is clear or pale yellow means that you are  getting enough fluid.  Try to drink at least 8-10 cups of fluid each day to help replace lost fluids.  You may add other liquids in addition to water, such as clear juice or decaffeinated sports drinks, as tolerated or as told by your health care provider.  Avoid drinks with caffeine, such as coffee, tea, or soft drinks.  Avoid alcohol. What foods are recommended? The items listed may not be a complete list. Talk with your health care provider about what dietary choices are best for you. Grains White rice. White, [place], or pita breads (fresh or toasted), including plain rolls, buns, or bagels. White pasta. Saltine, soda, or graham crackers. Pretzels. Low-fiber cereal. Cooked cereals made with water (such as cornmeal, farina, or cream cereals). Plain muffins. Matzo. Melba toast. Zwieback. Vegetables Potatoes (without the skin). Most well-cooked and canned vegetables without skins or seeds. Tender lettuce. Fruits Apple sauce. Fruits canned in juice. Cooked apricots, cherries, grapefruit, peaches, pears, or plums. Fresh bananas and cantaloupe. Meats and other protein foods Baked or boiled chicken. Eggs. Tofu. Fish. Seafood. Smooth nut butters. Ground or well-cooked tender beef, ham, veal, lamb, pork, or poultry. Dairy Plain yogurt, kefir, and unsweetened liquid yogurt. Lactose-free milk, buttermilk, skim milk, or soy milk. Low-fat or nonfat hard cheese. Beverages Water. Low-calorie sports drinks. Fruit juices without pulp. Strained tomato and vegetable juices. Decaffeinated teas. Sugar-free beverages not sweetened with sugar alcohols. Oral rehydration solutions, if approved by your health care provider. Seasoning and other foods Bouillon, broth, or soups made from recommended foods. What foods are not recommended? The items listed may not be a complete list.  Talk with your health care provider about what dietary choices are best for you. Grains Whole grain, whole wheat, bran, or rye breads,  rolls, pastas, and crackers. Wild or brown rice. Whole grain or bran cereals. Barley. Oats and oatmeal. Corn tortillas or taco shells. Granola. Popcorn. Vegetables Raw vegetables. Fried vegetables. Cabbage, broccoli, Brussels sprouts, artichokes, baked beans, beet greens, corn, kale, legumes, peas, sweet potatoes, and yams. Potato skins. Cooked spinach and cabbage. Fruits Dried fruit, including raisins and dates. Raw fruits. Stewed or dried prunes. Canned fruits with syrup. Meat and other protein foods Fried or fatty meats. Deli meats. Chunky nut butters. Nuts and seeds. Beans and lentils. Berniece Salines. Hot dogs. Sausage. Dairy High-fat cheeses. Whole milk, chocolate milk, and beverages made with milk, such as milk shakes. Half-and-half. Cream. sour cream. Ice cream. Beverages Caffeinated beverages (such as coffee, tea, soda, or energy drinks). Alcoholic beverages. Fruit juices with pulp. Prune juice. Soft drinks sweetened with high-fructose corn syrup or sugar alcohols. High-calorie sports drinks. Fats and oils Butter. Cream sauces. Margarine. Salad oils. Plain salad dressings. Olives. Avocados. Mayonnaise. Sweets and desserts Sweet rolls, doughnuts, and sweet breads. Sugar-free desserts sweetened with sugar alcohols such as xylitol and sorbitol. Seasoning and other foods Honey. Hot sauce. Chili powder. Gravy. Cream-based or milk-based soups. Pancakes and waffles. Summary  When you have diarrhea, the foods you eat and your eating habits are very important.  Make sure you get at least 8-10 cups of fluid each day, or enough to keep your urine clear or pale yellow.  Eat bland foods and gradually reintroduce healthy, nutrient-rich foods as tolerated, or as told by your health care provider.  Avoid high-fiber, fried, greasy, or spicy foods. This information is not intended to replace advice given to you by your health care provider. Make sure you discuss any questions you have with your health care  provider. Document Released: 12/05/2003 Document Revised: 09/11/2016 Document Reviewed: 09/11/2016 Elsevier Interactive Patient Education  [employer].

## 2017-10-06 ENCOUNTER — Telehealth: Payer: Self-pay | Admitting: Internal Medicine

## 2017-10-06 LAB — CBC
HEMATOCRIT: 43.2 % (ref 37.5–51.0)
HEMOGLOBIN: 15 g/dL (ref 13.0–17.7)
MCH: 28.3 pg (ref 26.6–33.0)
MCHC: 34.7 g/dL (ref 31.5–35.7)
MCV: 82 fL (ref 79–97)
Platelets: 156 10*3/uL (ref 150–379)
RBC: 5.3 x10E6/uL (ref 4.14–5.80)
RDW: 13.9 % (ref 12.3–15.4)
WBC: 11.2 10*3/uL — AB (ref 3.4–10.8)

## 2017-10-06 LAB — CMP AND LIVER
ALK PHOS: 69 IU/L (ref 39–117)
ALT: 34 IU/L (ref 0–44)
AST: 25 IU/L (ref 0–40)
Albumin: 4.7 g/dL (ref 3.5–5.5)
BUN: 16 mg/dL (ref 6–24)
Bilirubin Total: 0.7 mg/dL (ref 0.0–1.2)
Bilirubin, Direct: 0.16 mg/dL (ref 0.00–0.40)
CO2: 22 mmol/L (ref 20–29)
CREATININE: 0.82 mg/dL (ref 0.76–1.27)
Calcium: 9.4 mg/dL (ref 8.7–10.2)
Chloride: 100 mmol/L (ref 96–106)
GFR calc Af Amer: 114 mL/min/{1.73_m2} (ref >59)
GFR calc non Af Amer: 99 mL/min/{1.73_m2} (ref >59)
Glucose: 101 mg/dL — ABNORMAL HIGH (ref 65–99)
Potassium: 3.9 mmol/L (ref 3.5–5.2)
SODIUM: 138 mmol/L (ref 134–144)
TOTAL PROTEIN: 6.8 g/dL (ref 6.0–8.5)

## 2017-10-11 ENCOUNTER — Encounter: Payer: Self-pay | Admitting: Internal Medicine

## 2017-10-11 ENCOUNTER — Ambulatory Visit: Payer: Self-pay | Attending: Internal Medicine | Admitting: Internal Medicine

## 2017-10-11 VITALS — BP 120/90 | HR 54 | Temp 98.0°F | Resp 16 | Wt 176.6 lb

## 2017-10-11 DIAGNOSIS — Z87891 Personal history of nicotine dependence: Secondary | ICD-10-CM | POA: Insufficient documentation

## 2017-10-11 DIAGNOSIS — I10 Essential (primary) hypertension: Secondary | ICD-10-CM | POA: Insufficient documentation

## 2017-10-11 DIAGNOSIS — K2270 Barrett's esophagus without dysplasia: Secondary | ICD-10-CM | POA: Insufficient documentation

## 2017-10-11 DIAGNOSIS — K219 Gastro-esophageal reflux disease without esophagitis: Secondary | ICD-10-CM | POA: Insufficient documentation

## 2017-10-11 DIAGNOSIS — Z79890 Hormone replacement therapy (postmenopausal): Secondary | ICD-10-CM | POA: Insufficient documentation

## 2017-10-11 DIAGNOSIS — J439 Emphysema, unspecified: Secondary | ICD-10-CM | POA: Insufficient documentation

## 2017-10-11 DIAGNOSIS — E559 Vitamin D deficiency, unspecified: Secondary | ICD-10-CM | POA: Insufficient documentation

## 2017-10-11 DIAGNOSIS — E039 Hypothyroidism, unspecified: Secondary | ICD-10-CM | POA: Insufficient documentation

## 2017-10-11 DIAGNOSIS — G8929 Other chronic pain: Secondary | ICD-10-CM | POA: Insufficient documentation

## 2017-10-11 DIAGNOSIS — R040 Epistaxis: Secondary | ICD-10-CM | POA: Insufficient documentation

## 2017-10-11 DIAGNOSIS — Z79899 Other long term (current) drug therapy: Secondary | ICD-10-CM | POA: Insufficient documentation

## 2017-10-11 NOTE — Patient Instructions (Signed)
Purchase and use Afrin Nasal spray over the counter. 2 sprays in right nose daily for the next three days.    Use a humidifier in your bedroom.  Do not pick your nose.

## 2017-10-11 NOTE — Progress Notes (Signed)
Patient ID: Darren [name], male    DOB: December 22, 1960  MRN: [id number]  CC: Epistaxis   Subjective: Darren [name] is a 57 y.o. male who presents for UC His concerns today include:  Pt with hx of GERD, hypothyroid, HTN, chronic LBP and Barrett's esophagus  1.  C/o nose bleed RT side about 3 wks ago -occurring several times a wk Can last up to 5 mins Had bleeds in past but not this frequent -denies nose picking  2.  BP elevated today Just pick up Cozaar 1 wk ago and taking regularly since then Patient Active Problem List   Diagnosis Date Noted  . Bilateral wrist pain 04/22/2017  . Disorder of left sacroiliac joint 12/21/2016  . Spondylosis without myelopathy or radiculopathy, lumbar region 12/21/2016  . Lumbar degenerative disc disease 12/21/2016  . Abnormal CT scan, esophagus 08/24/2016  . COPD (chronic obstructive pulmonary disease) with emphysema ([place]) 07/31/2016  . HTN (hypertension) 07/31/2016  . Dilation of thoracic aorta ([place]) 07/31/2016  . Esophageal dysphagia 07/09/2016  . Chronic midline low back pain 07/09/2016  . Sensorineural hearing loss (SNHL), bilateral 03/30/2016  . Vitamin D deficiency 04/06/2014  . Hypothyroidism 10/31/2013  . GERD (gastroesophageal reflux disease) 11/25/2011  . Barrett esophagus 09/29/1999     Current Outpatient Medications on File Prior to Visit  Medication Sig Dispense Refill  . diclofenac sodium (VOLTAREN) 1 % GEL Apply 2 g topically 4 (four) times daily. 100 g 5  . fluticasone (FLONASE) 50 MCG/ACT nasal spray Place 2 sprays into both nostrils daily. (Patient not taking: Reported on 10/11/2017) 16 g 0  . gabapentin (NEURONTIN) 300 MG capsule Take 300 mg by mouth.    . hydrOXYzine (ATARAX/VISTARIL) 25 MG tablet Take 1 tablet (25 mg total) by mouth 2 (two) times daily. 60 tablet 5  . levothyroxine (SYNTHROID, LEVOTHROID) 175 MCG tablet Take 1 tablet (175 mcg total) by mouth daily. 30 tablet 6  . loperamide (IMODIUM A-D) 2 MG tablet Take  1 tablet (2 mg total) by mouth 4 (four) times daily as needed for diarrhea or loose stools. 30 tablet 0  . losartan (COZAAR) 25 MG tablet Take 1 tablet (25 mg total) by mouth daily. 30 tablet 2  . naproxen (NAPROSYN) 500 MG tablet Take 1 tablet (500 mg total) by mouth 2 (two) times daily with a meal. X 1 week then Prn pain 60 tablet 0  . ondansetron (ZOFRAN) 4 MG tablet Take 1 tablet (4 mg total) by mouth every 8 (eight) hours as needed for nausea or vomiting. 20 tablet 0  . pantoprazole (PROTONIX) 40 MG tablet Take 1 tablet (40 mg total) by mouth daily. 90 tablet 3  . Propyl Glycol-Hydroxyethylcell (NASAL MOIST) GEL Use as directed.  0  . sodium chloride (OCEAN) 0.65 % SOLN nasal spray Place 1 spray into both nostrils as needed for congestion.  0   No current facility-administered medications on file prior to visit.     No Known Allergies  Social History   Socioeconomic History  . Marital status: Single    Spouse name: Not on file  . Number of children: 2  . Years of education: Not on file  . Highest education level: Not on file  Social Needs  . Financial resource strain: Not on file  . Food insecurity - worry: Not on file  . Food insecurity - inability: Not on file  . Transportation needs - medical: Not on file  . Transportation needs - non-medical: Not on  file  Occupational History  . Not on file  Tobacco Use  . Smoking status: Former Smoker    Packs/day: 1.00    Years: 26.00    Pack years: 26.00    Types: Cigarettes    Last attempt to quit: 09/15/2004    Years since quitting: 13.0  . Smokeless tobacco: Never Used  . Tobacco comment: Quit in 2005  Substance and Sexual Activity  . Alcohol use: Yes    Comment: ON WEEKENDS  . Drug use: No  . Sexual activity: No    Birth control/protection: None  Other Topics Concern  . Not on file  Social History Narrative   Lives alone.      Family History  Problem Relation Age of Onset  . Cancer Father        lung  . Stroke  Father   . Heart attack Father 54  . Lung cancer Mother   . Thyroid disease Sister   . Colon cancer Neg Hx   . Esophageal cancer Neg Hx     Past Surgical History:  Procedure Laterality Date  . BIOPSY  09/18/2016   Procedure: BIOPSY;  Surgeon: Daneil Dolin, MD;  [facility];  Service: Endoscopy;;  biopsy of distal esophagus  . ESOPHAGOGASTRODUODENOSCOPY  2001   barrett's per path  . ESOPHAGOGASTRODUODENOSCOPY (EGD) WITH PROPOFOL N/A 09/18/2016   Procedure: ESOPHAGOGASTRODUODENOSCOPY (EGD) WITH PROPOFOL;  Surgeon: Daneil Dolin, MD;  [facility];  Service: Endoscopy;  Laterality: N/A;  1200  . [place]  . MULTIPLE EXTRACTIONS WITH ALVEOLOPLASTY  11/30/2011   Procedure: MULTIPLE EXTRACION WITH ALVEOLOPLASTY;  Surgeon: Gae Bon, DDS;  Location: [place];  Service: Oral Surgery;  Laterality: Bilateral;    ROS: Review of Systems Neg except as above PHYSICAL EXAM: BP (!) 154/92   Pulse (!) 54   Temp 98 F (36.7 C) (Oral)   Resp 16   Wt 176 lb 9.6 oz (80.1 kg)   SpO2 95%   BMI 31.28 kg/m   Repeat 120/90 Physical Exam  General appearance - alert, well appearing, and in no distress Mental status - alert, oriented to person, place, and time, normal mood, behavior, speech, dress, motor activity, and thought processes Nose - small amount dried blood noted in RT nostril. Mucosa dry in both nostril  ASSESSMENT AND PLAN: 1. Epistaxis -recommend using a humidifier in bedroom at nights -use OTC Afrin nasal spray once a day x 3 days RT nostril  Patient was given the opportunity to ask questions.  Patient verbalized understanding of the plan and was able to repeat key elements of the plan.   No orders of the defined types were placed in this encounter.    Requested Prescriptions    No prescriptions requested or ordered in this encounter    Return if symptoms worsen or fail to improve.  Karle Plumber, MD, FACP

## 2017-10-15 ENCOUNTER — Telehealth: Payer: Self-pay | Admitting: Internal Medicine

## 2017-10-15 MED FILL — LOSARTAN POTASSIUM 25 MG TA: 25 | 30 days supply | Qty: 30 | Fill #1

## 2017-10-15 MED FILL — LEVOTHYROXINE 175 MCG TAB: 175 | 30 days supply | Qty: 30 | Fill #2

## 2017-10-15 MED FILL — DICLOFENAC SODIUM 1% GEL: 1 | 12 days supply | Qty: 100 | Fill #2

## 2017-10-15 MED FILL — ?PANTOPRAZOLE SOD DR 40MG: 40 MG | 30 days supply | Qty: 30 | Fill #5

## 2017-10-15 NOTE — Telephone Encounter (Signed)
Called patient and he informed me that he knows that he has refills at the pharmacy, he had been trying to contact the pharmacy to get refills but they would not answer the call. Will let pharmacy know that patient is requesting refills on these medications.

## 2017-10-15 NOTE — Telephone Encounter (Signed)
Patient called and requested for refill on listed medications. Patient stated he only had 3 pills left for each.  levothyroxine (SYNTHROID, LEVOTHROID) 175 MCG tablet [[id number]] pantoprazole (PROTONIX) 40 MG tablet [[id number]]  losartan (COZAAR) 25 MG tablet [[id number]]  diclofenac sodium (VOLTAREN) 1 % GEL [[id number]]

## 2017-10-25 ENCOUNTER — Telehealth: Payer: Self-pay | Admitting: Internal Medicine

## 2017-10-25 NOTE — Telephone Encounter (Signed)
Medical Assistant left message on patient's home and cell voicemail. Voicemail states to give a call back to [place] with [facility] at [phone]. !!!!Please inform patient of labs being normal and no signs of anemia. WBC is elevated which can indicate an infection. Please follow up in the office!!!!

## 2017-10-25 NOTE — Telephone Encounter (Signed)
Patient says that he is still having noise bleeds. He would like to know what to do. I made him an appointment for the 7th at 4:15. Please fu with the patient he wants to know what he can do in the mean time.

## 2017-10-25 NOTE — Telephone Encounter (Signed)
-----   Message from Alfonse Spruce, [place] sent at 10/06/2017  3:35 PM EST ----- Labs that evaluated your fluid and electrolyte balance are normal. No signs of anemia. WBC count elevated this can indicate acute infection.  Recommend follow up.

## 2017-10-27 NOTE — Telephone Encounter (Signed)
Pt returned call and pt states he has tried the afrin nasal spray and his nose is still bleeding but not as bad. Pt states he would like the referral to ENT. Pt is schedule for an appointment on February 7. Pt wants to know do you want him to continue to do the afrin nasal spray.

## 2017-10-27 NOTE — Telephone Encounter (Signed)
Tried contacting pt to give Dr. Wynetta Emery response pt didn't answer lvm asking pt to give me a call at his earliest convenience

## 2017-10-28 NOTE — Telephone Encounter (Signed)
If epistaxis has improved, continue nasal spray until office visit with PCP.  If worsening, hold off on nasal spray until office visit.

## 2017-10-28 NOTE — Telephone Encounter (Signed)
Pt states he hasn't had a nose bleed in 3 days. Pt states he will continue to use it if needed.

## 2017-11-04 ENCOUNTER — Encounter: Payer: Self-pay | Admitting: Internal Medicine

## 2017-11-04 ENCOUNTER — Ambulatory Visit: Payer: Self-pay | Attending: Internal Medicine | Admitting: Internal Medicine

## 2017-11-04 VITALS — BP 158/91 | HR 53 | Temp 97.8°F | Resp 16 | Wt 177.2 lb

## 2017-11-04 DIAGNOSIS — E66811 Obesity, class 1: Secondary | ICD-10-CM | POA: Insufficient documentation

## 2017-11-04 DIAGNOSIS — E039 Hypothyroidism, unspecified: Secondary | ICD-10-CM | POA: Insufficient documentation

## 2017-11-04 DIAGNOSIS — E669 Obesity, unspecified: Secondary | ICD-10-CM | POA: Insufficient documentation

## 2017-11-04 DIAGNOSIS — R040 Epistaxis: Secondary | ICD-10-CM | POA: Insufficient documentation

## 2017-11-04 DIAGNOSIS — K529 Noninfective gastroenteritis and colitis, unspecified: Secondary | ICD-10-CM | POA: Insufficient documentation

## 2017-11-04 DIAGNOSIS — Z79890 Hormone replacement therapy (postmenopausal): Secondary | ICD-10-CM | POA: Insufficient documentation

## 2017-11-04 DIAGNOSIS — Z79899 Other long term (current) drug therapy: Secondary | ICD-10-CM | POA: Insufficient documentation

## 2017-11-04 DIAGNOSIS — Z6831 Body mass index (BMI) 31.0-31.9, adult: Secondary | ICD-10-CM | POA: Insufficient documentation

## 2017-11-04 DIAGNOSIS — Z87891 Personal history of nicotine dependence: Secondary | ICD-10-CM | POA: Insufficient documentation

## 2017-11-04 DIAGNOSIS — G8929 Other chronic pain: Secondary | ICD-10-CM | POA: Insufficient documentation

## 2017-11-04 DIAGNOSIS — K219 Gastro-esophageal reflux disease without esophagitis: Secondary | ICD-10-CM | POA: Insufficient documentation

## 2017-11-04 DIAGNOSIS — E559 Vitamin D deficiency, unspecified: Secondary | ICD-10-CM | POA: Insufficient documentation

## 2017-11-04 DIAGNOSIS — J449 Chronic obstructive pulmonary disease, unspecified: Secondary | ICD-10-CM | POA: Insufficient documentation

## 2017-11-04 DIAGNOSIS — D72829 Elevated white blood cell count, unspecified: Secondary | ICD-10-CM | POA: Insufficient documentation

## 2017-11-04 DIAGNOSIS — E6609 Other obesity due to excess calories: Secondary | ICD-10-CM

## 2017-11-04 DIAGNOSIS — K2270 Barrett's esophagus without dysplasia: Secondary | ICD-10-CM | POA: Insufficient documentation

## 2017-11-04 DIAGNOSIS — M545 Low back pain: Secondary | ICD-10-CM | POA: Insufficient documentation

## 2017-11-04 DIAGNOSIS — I10 Essential (primary) hypertension: Secondary | ICD-10-CM | POA: Insufficient documentation

## 2017-11-04 MED ORDER — DICLOFENAC SODIUM 1 % TD GEL: 2.0000 g | Freq: Four times a day (QID) | TRANSDERMAL | 5 refills | Status: DC

## 2017-11-04 MED FILL — DICLOFENAC SODIUM 1% GEL: 1 | 12 days supply | Qty: 100 | Fill #0

## 2017-11-04 NOTE — Progress Notes (Signed)
Patient ID: KAAN TOSH, male    DOB: 04/13/1961  MRN: [id number]  CC: epistaxis  Subjective: Rashaud Ybarbo is a 57 y.o. male who presents for UC visit His concerns today include: Pt with hx of GERD, hypothyroid, HTN, chronic LBP and Barrett's esophagus  1.  Epistaxis: Patient with epistaxis on last visit.  Advise use of Afrin nasal spray for 3 days.  He reports he did well until last week when he had another episode of nosebleed from the right side.  He had called in wanting to know whether he should use the Afrin spray again. -He is in the process of applying for [facility] discount  2.  On visit last month with the nurse practitioner he had blood work done and was found to have mild elevation in WBC.  He had gastroenteritis going on at the time.  He received notification of his lab results and was told that he may need to have the white blood cell count repeated.  3.  Obesity: Working on trying to get his weight down and in particular wanting to lose some of the fat around the abdomen.  He has been doing some home stretching exercises, trying to be careful not to aggravate his back  Patient Active Problem List   Diagnosis Date Noted  . Bilateral wrist pain 04/22/2017  . Disorder of left sacroiliac joint 12/21/2016  . Spondylosis without myelopathy or radiculopathy, lumbar region 12/21/2016  . Lumbar degenerative disc disease 12/21/2016  . Abnormal CT scan, esophagus 08/24/2016  . COPD (chronic obstructive pulmonary disease) with emphysema ([place]) 07/31/2016  . HTN (hypertension) 07/31/2016  . Dilation of thoracic aorta ([place]) 07/31/2016  . Esophageal dysphagia 07/09/2016  . Chronic midline low back pain 07/09/2016  . Sensorineural hearing loss (SNHL), bilateral 03/30/2016  . Vitamin D deficiency 04/06/2014  . Hypothyroidism 10/31/2013  . GERD (gastroesophageal reflux disease) 11/25/2011  . Barrett esophagus 09/29/1999     Current Outpatient Medications on File Prior to Visit    Medication Sig Dispense Refill  . fluticasone (FLONASE) 50 MCG/ACT nasal spray Place 2 sprays into both nostrils daily. (Patient not taking: Reported on 10/11/2017) 16 g 0  . gabapentin (NEURONTIN) 300 MG capsule Take 300 mg by mouth.    . hydrOXYzine (ATARAX/VISTARIL) 25 MG tablet Take 1 tablet (25 mg total) by mouth 2 (two) times daily. 60 tablet 5  . levothyroxine (SYNTHROID, LEVOTHROID) 175 MCG tablet Take 1 tablet (175 mcg total) by mouth daily. 30 tablet 6  . loperamide (IMODIUM A-D) 2 MG tablet Take 1 tablet (2 mg total) by mouth 4 (four) times daily as needed for diarrhea or loose stools. 30 tablet 0  . losartan (COZAAR) 25 MG tablet Take 1 tablet (25 mg total) by mouth daily. 30 tablet 2  . naproxen (NAPROSYN) 500 MG tablet Take 1 tablet (500 mg total) by mouth 2 (two) times daily with a meal. X 1 week then Prn pain 60 tablet 0  . ondansetron (ZOFRAN) 4 MG tablet Take 1 tablet (4 mg total) by mouth every 8 (eight) hours as needed for nausea or vomiting. 20 tablet 0  . pantoprazole (PROTONIX) 40 MG tablet Take 1 tablet (40 mg total) by mouth daily. 90 tablet 3  . Propyl Glycol-Hydroxyethylcell (NASAL MOIST) GEL Use as directed.  0  . sodium chloride (OCEAN) 0.65 % SOLN nasal spray Place 1 spray into both nostrils as needed for congestion.  0   No current facility-administered medications on file prior to  visit.     No Known Allergies  Social History   Socioeconomic History  . Marital status: Single    Spouse name: Not on file  . Number of children: 2  . Years of education: Not on file  . Highest education level: Not on file  Social Needs  . Financial resource strain: Not on file  . Food insecurity - worry: Not on file  . Food insecurity - inability: Not on file  . Transportation needs - medical: Not on file  . Transportation needs - non-medical: Not on file  Occupational History  . Not on file  Tobacco Use  . Smoking status: Former Smoker    Packs/day: 1.00    Years:  26.00    Pack years: 26.00    Types: Cigarettes    Last attempt to quit: 09/15/2004    Years since quitting: 13.1  . Smokeless tobacco: Never Used  . Tobacco comment: Quit in 2005  Substance and Sexual Activity  . Alcohol use: Yes    Comment: ON WEEKENDS  . Drug use: No  . Sexual activity: No    Birth control/protection: None  Other Topics Concern  . Not on file  Social History Narrative   Lives alone.      Family History  Problem Relation Age of Onset  . Cancer Father        lung  . Stroke Father   . Heart attack Father 78  . Lung cancer Mother   . Thyroid disease Sister   . Colon cancer Neg Hx   . Esophageal cancer Neg Hx     Past Surgical History:  Procedure Laterality Date  . BIOPSY  09/18/2016   Procedure: BIOPSY;  Surgeon: Daneil Dolin, MD;  [facility];  Service: Endoscopy;;  biopsy of distal esophagus  . ESOPHAGOGASTRODUODENOSCOPY  2001   barrett's per path  . ESOPHAGOGASTRODUODENOSCOPY (EGD) WITH PROPOFOL N/A 09/18/2016   Procedure: ESOPHAGOGASTRODUODENOSCOPY (EGD) WITH PROPOFOL;  Surgeon: Daneil Dolin, MD;  [facility];  Service: Endoscopy;  Laterality: N/A;  1200  . [place]  . MULTIPLE EXTRACTIONS WITH ALVEOLOPLASTY  11/30/2011   Procedure: MULTIPLE EXTRACION WITH ALVEOLOPLASTY;  Surgeon: Gae Bon, DDS;  Location: [place];  Service: Oral Surgery;  Laterality: Bilateral;    ROS: Review of Systems Neg except as above  PHYSICAL EXAM: BP (!) 158/91   Pulse (!) 53   Temp 97.8 F (36.6 C) (Oral)   Resp 16   Wt 177 lb 3.2 oz (80.4 kg)   SpO2 95%   BMI 31.39 kg/m   Repeat BP 128/78 Physical Exam  General appearance - alert, well appearing, middle age caucasian male and in no distress Nose - normal and patent, no erythema, discharge or polyps Chest - clear to auscultation, no wheezes, rales or rhonchi, symmetric air entry Heart - normal rate, regular rhythm, normal S1, S2, no murmurs, rubs, clicks or  gallops   ASSESSMENT AND PLAN: 1. Epistaxis Given that he continues to have repeated episodes from the same nostril, I will refer him to ENT. - Ambulatory referral to ENT  2. Essential hypertension At goal.  Continue Cozaar  3. Class 1 obesity due to excess calories without serious comorbidity with body mass index (BMI) of 31.0 to 31.9 in adult -Discussed healthy eating habits.  Patient wanted me to also give him some printed information which I did. -Encourage regular aerobic exercise like walking for 20 minutes  3-4 times a week.  4. Chronic bilateral low back pain without sciatica - diclofenac sodium (VOLTAREN) 1 % GEL; Apply 2 g topically 4 (four) times daily.  Dispense: 100 g; Refill: 5  5. Leukocytosis, unspecified type -Mild elevation in white blood cell may have been due to the acute gastrointestinal illness he had going on at the time.  We can hold off on repeat  Patient was given the opportunity to ask questions.  Patient verbalized understanding of the plan and was able to repeat key elements of the plan.   Orders Placed This Encounter  Procedures  . Ambulatory referral to ENT     Requested Prescriptions   Signed Prescriptions Disp Refills  . diclofenac sodium (VOLTAREN) 1 % GEL 100 g 5    Sig: Apply 2 g topically 4 (four) times daily.    Return in about 3 months (around 02/01/2018).  Karle Plumber, MD, FACP

## 2017-11-04 NOTE — Patient Instructions (Signed)

## 2017-11-16 MED FILL — ?PANTOPRAZOLE SOD DR 40MG: 40 MG | 30 days supply | Qty: 30 | Fill #6

## 2017-11-16 MED FILL — LEVOTHYROXINE 175 MCG TAB: 175 | 30 days supply | Qty: 30 | Fill #3

## 2017-11-19 ENCOUNTER — Ambulatory Visit: Payer: Self-pay | Attending: Internal Medicine

## 2017-11-30 MED FILL — LOSARTAN POTASSIUM 25 MG TA: 25 | 30 days supply | Qty: 30 | Fill #2

## 2017-11-30 MED FILL — DICLOFENAC SODIUM 1% GEL: 1 | 12 days supply | Qty: 100 | Fill #3

## 2017-12-15 MED FILL — ?PANTOPRAZOLE SOD DR 40MG T: 40 | 30 days supply | Qty: 30 | Fill #7

## 2017-12-15 MED FILL — LEVOTHYROXINE 175 MCG TAB: 175 | 30 days supply | Qty: 30 | Fill #4

## 2017-12-17 MED FILL — DICLOFENAC SODIUM 1% GEL: 1 | 12 days supply | Qty: 100 | Fill #4

## 2018-01-05 MED FILL — LOSARTAN POTASSIUM 25 MG TA: 25 | 30 days supply | Qty: 30 | Fill #4

## 2018-01-18 MED FILL — LEVOTHYROXINE 175 MCG TAB: 175 | 30 days supply | Qty: 30 | Fill #5

## 2018-01-18 MED FILL — PANTOPRAZOLE SOD DR 40 MG T: 40 | 30 days supply | Qty: 30 | Fill #8

## 2018-01-18 MED FILL — DICLOFENAC SODIUM 1% GEL: 1 | 12 days supply | Qty: 100 | Fill #5

## 2018-02-01 ENCOUNTER — Ambulatory Visit: Payer: Self-pay | Attending: Internal Medicine | Admitting: Internal Medicine

## 2018-02-01 ENCOUNTER — Encounter: Payer: Self-pay | Admitting: Internal Medicine

## 2018-02-01 VITALS — BP 112/70 | HR 61 | Temp 98.0°F | Resp 16 | Wt 174.4 lb

## 2018-02-01 DIAGNOSIS — G8929 Other chronic pain: Secondary | ICD-10-CM | POA: Insufficient documentation

## 2018-02-01 DIAGNOSIS — Z09 Encounter for follow-up examination after completed treatment for conditions other than malignant neoplasm: Secondary | ICD-10-CM | POA: Insufficient documentation

## 2018-02-01 DIAGNOSIS — M545 Low back pain, unspecified: Secondary | ICD-10-CM

## 2018-02-01 DIAGNOSIS — Z791 Long term (current) use of non-steroidal anti-inflammatories (NSAID): Secondary | ICD-10-CM | POA: Insufficient documentation

## 2018-02-01 DIAGNOSIS — Z823 Family history of stroke: Secondary | ICD-10-CM | POA: Insufficient documentation

## 2018-02-01 DIAGNOSIS — K219 Gastro-esophageal reflux disease without esophagitis: Secondary | ICD-10-CM | POA: Insufficient documentation

## 2018-02-01 DIAGNOSIS — Z87891 Personal history of nicotine dependence: Secondary | ICD-10-CM | POA: Insufficient documentation

## 2018-02-01 DIAGNOSIS — Z79899 Other long term (current) drug therapy: Secondary | ICD-10-CM | POA: Insufficient documentation

## 2018-02-01 DIAGNOSIS — M47816 Spondylosis without myelopathy or radiculopathy, lumbar region: Secondary | ICD-10-CM | POA: Insufficient documentation

## 2018-02-01 DIAGNOSIS — Z8249 Family history of ischemic heart disease and other diseases of the circulatory system: Secondary | ICD-10-CM | POA: Insufficient documentation

## 2018-02-01 DIAGNOSIS — K2270 Barrett's esophagus without dysplasia: Secondary | ICD-10-CM | POA: Insufficient documentation

## 2018-02-01 DIAGNOSIS — Z9889 Other specified postprocedural states: Secondary | ICD-10-CM | POA: Insufficient documentation

## 2018-02-01 DIAGNOSIS — I10 Essential (primary) hypertension: Secondary | ICD-10-CM

## 2018-02-01 DIAGNOSIS — Z801 Family history of malignant neoplasm of trachea, bronchus and lung: Secondary | ICD-10-CM | POA: Insufficient documentation

## 2018-02-01 DIAGNOSIS — Z1211 Encounter for screening for malignant neoplasm of colon: Secondary | ICD-10-CM

## 2018-02-01 DIAGNOSIS — E039 Hypothyroidism, unspecified: Secondary | ICD-10-CM

## 2018-02-01 DIAGNOSIS — J449 Chronic obstructive pulmonary disease, unspecified: Secondary | ICD-10-CM | POA: Insufficient documentation

## 2018-02-01 MED ORDER — TRAMADOL HCL 50 MG PO TABS: 50.0000 mg | ORAL_TABLET | Freq: Two times a day (BID) | ORAL | 0 refills | Status: DC | PRN

## 2018-02-01 MED ORDER — CYCLOBENZAPRINE HCL 5 MG PO TABS
5.0000 mg | ORAL_TABLET | Freq: Two times a day (BID) | ORAL | 0 refills | Status: DC | PRN
Start: 2018-02-01 — End: 2018-03-14

## 2018-02-01 MED FILL — traMADol HCL 50 MG TABS: 50 | 15 days supply | Qty: 30 | Fill #0

## 2018-02-01 MED FILL — CYCLOBENZAPRINE 5 MG TABLET: 5 | 15 days supply | Qty: 30 | Fill #0

## 2018-02-01 NOTE — Progress Notes (Signed)
Patient ID: Darren [name], male    DOB: 08-Mar-1961  MRN: [id number]  CC: Follow-up   Subjective: Darren [name] is a 57 y.o. male who presents for chronic ds management. His concerns today include:  Pt with hx of GERD, hypothyroid, HTN, chronic LBP and Barrett's esophagus  LBP:  Flare up after doing a lot of lifting and bending at work for past 6 days ago.  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   -Pain is across lower back  And in upper thigh.  The pain does not radiate.  There is no numbness or tingling in the legs.  No incontinence of bowel or bladder. -he was taking some Tyl#3 that he had left that was given in past by Dr. Adrian Blackwater.  He is currently out.   gabapentin is on his med list but patient does not recognize the name and reports that he is not taking  HTN;  Reports compliance with Losartan; ate some seasoned fries today. No CP/SOB  Thyroid:  Compliant with Levovothyroxine.  No major wgh changes  Patient Active Problem List   Diagnosis Date Noted  . Epistaxis 11/04/2017  . Class 1 obesity due to excess calories without serious comorbidity with body mass index (BMI) of 31.0 to 31.9 in adult 11/04/2017  . Bilateral wrist pain 04/22/2017  . Disorder of left sacroiliac joint 12/21/2016  . Spondylosis without myelopathy or radiculopathy, lumbar region 12/21/2016  . Lumbar degenerative disc disease 12/21/2016  . Abnormal CT scan, esophagus 08/24/2016  . COPD (chronic obstructive pulmonary disease) with emphysema ([place]) 07/31/2016  . HTN (hypertension) 07/31/2016  . Dilation of thoracic aorta ([place]) 07/31/2016  . Esophageal dysphagia 07/09/2016  . Chronic midline low back pain 07/09/2016  . Sensorineural hearing loss (SNHL), bilateral 03/30/2016  . Vitamin D deficiency 04/06/2014  . Hypothyroidism 10/31/2013  . GERD (gastroesophageal reflux disease) 11/25/2011  . Barrett esophagus 09/29/1999     Current Outpatient Medications on  File Prior to Visit  Medication Sig Dispense Refill  . diclofenac sodium (VOLTAREN) 1 % GEL Apply 2 g topically 4 (four) times daily. 100 g 5  . fluticasone (FLONASE) 50 MCG/ACT nasal spray Place 2 sprays into both nostrils daily. (Patient not taking: Reported on 10/11/2017) 16 g 0  . hydrOXYzine (ATARAX/VISTARIL) 25 MG tablet Take 1 tablet (25 mg total) by mouth 2 (two) times daily. 60 tablet 5  . levothyroxine (SYNTHROID, LEVOTHROID) 175 MCG tablet Take 1 tablet (175 mcg total) by mouth daily. 30 tablet 6  . loperamide (IMODIUM A-D) 2 MG tablet Take 1 tablet (2 mg total) by mouth 4 (four) times daily as needed for diarrhea or loose stools. 30 tablet 0  . losartan (COZAAR) 25 MG tablet Take 1 tablet (25 mg total) by mouth daily. 30 tablet 2  . naproxen (NAPROSYN) 500 MG tablet Take 1 tablet (500 mg total) by mouth 2 (two) times daily with a meal. X 1 week then Prn pain 60 tablet 0  . ondansetron (ZOFRAN) 4 MG tablet Take 1 tablet (4 mg total) by mouth every 8 (eight) hours as needed for nausea or vomiting. 20 tablet 0  . pantoprazole (PROTONIX) 40 MG tablet Take 1 tablet (40 mg total) by mouth daily. 90 tablet 3  . Propyl Glycol-Hydroxyethylcell (NASAL MOIST) GEL Use as directed.  0  . sodium chloride (OCEAN) 0.65 % SOLN nasal spray Place 1 spray into both nostrils as needed for congestion.  0  No current facility-administered medications on file prior to visit.     No Known Allergies  Social History   Socioeconomic History  . Marital status: Single    Spouse name: Not on file  . Number of children: 2  . Years of education: Not on file  . Highest education level: Not on file  Occupational History  . Not on file  Social Needs  . Financial resource strain: Not on file  . Food insecurity:    Worry: Not on file    Inability: Not on file  . Transportation needs:    Medical: Not on file    Non-medical: Not on file  Tobacco Use  . Smoking status: Former Smoker    Packs/day: 1.00     Years: 26.00    Pack years: 26.00    Types: Cigarettes    Last attempt to quit: 09/15/2004    Years since quitting: 13.3  . Smokeless tobacco: Never Used  . Tobacco comment: Quit in 2005  Substance and Sexual Activity  . Alcohol use: Yes    Comment: ON WEEKENDS  . Drug use: No  . Sexual activity: Never    Birth control/protection: None  Lifestyle  . Physical activity:    Days per week: Not on file    Minutes per session: Not on file  . Stress: Not on file  Relationships  . Social connections:    Talks on phone: Not on file    Gets together: Not on file    Attends religious service: Not on file    Active member of club or organization: Not on file    Attends meetings of clubs or organizations: Not on file    Relationship status: Not on file  . Intimate partner violence:    Fear of current or ex partner: Not on file    Emotionally abused: Not on file    Physically abused: Not on file    Forced sexual activity: Not on file  Other Topics Concern  . Not on file  Social History Narrative   Lives alone.      Family History  Problem Relation Age of Onset  . Cancer Father        lung  . Stroke Father   . Heart attack Father 80  . Lung cancer Mother   . Thyroid disease Sister   . Colon cancer Neg Hx   . Esophageal cancer Neg Hx     Past Surgical History:  Procedure Laterality Date  . BIOPSY  09/18/2016   Procedure: BIOPSY;  Surgeon: Daneil Dolin, MD;  [facility];  Service: Endoscopy;;  biopsy of distal esophagus  . ESOPHAGOGASTRODUODENOSCOPY  2001   barrett's per path  . ESOPHAGOGASTRODUODENOSCOPY (EGD) WITH PROPOFOL N/A 09/18/2016   Procedure: ESOPHAGOGASTRODUODENOSCOPY (EGD) WITH PROPOFOL;  Surgeon: Daneil Dolin, MD;  [facility];  Service: Endoscopy;  Laterality: N/A;  1200  . [place]  . MULTIPLE EXTRACTIONS WITH ALVEOLOPLASTY  11/30/2011   Procedure: MULTIPLE EXTRACION WITH ALVEOLOPLASTY;  Surgeon: Gae Bon,  DDS;  Location: [place];  Service: Oral Surgery;  Laterality: Bilateral;    ROS: Review of Systems Neg except as above PHYSICAL EXAM: BP 112/70   Pulse 61   Temp 98 F (36.7 C) (Oral)   Resp 16   Wt 174 lb 6.4 oz (79.1 kg)   SpO2 95%   BMI 30.89 kg/m   Wt Readings from Last 3 Encounters:  02/01/18 174 lb 6.4 oz (79.1 kg)  11/04/17 177 lb 3.2 oz (80.4 kg)  10/11/17 176 lb 9.6 oz (80.1 kg)    Physical Exam  General appearance - alert, well appearing, and in no distress Mental status - alert, oriented to person, place, and time Mouth - mucous membranes moist, pharynx normal without lesions Neck - supple, no significant adenopathy Chest - clear to auscultation, no wheezes, rales or rhonchi, symmetric air entry Heart - normal rate, regular rhythm, normal S1, S2, no murmurs, rubs, clicks or gallops Extremities - no edema MSK: Mild to moderate tenderness on palpation of the lumbar spine in the midline.  Tightness of the surrounding paraspinal muscles.  Power in lower extremities 5/5.  ASSESSMENT AND PLAN: 1. Essential hypertension At goal.  Continue Cozaar.  Encourage low-salt diet - Lipid panel  2. Acute midline low back pain without sciatica Flareup of acute on chronic lower back pain.  Advised against heavy lifting.  I offered to write a letter giving him work restrictions or keeping him off of work for several days but patient declined.  He would like to do it but is afraid of losing his job.  I have given some tramadol and Flexeril to use as needed.  I warned that both can cause drowsiness.  Recommend use of a heating pad also. - traMADol (ULTRAM) 50 MG tablet; Take 1 tablet (50 mg total) by mouth 2 (two) times daily as needed.  Dispense: 30 tablet; Refill: 0 - cyclobenzaprine (FLEXERIL) 5 MG tablet; Take 1 tablet (5 mg total) by mouth 2 (two) times daily as needed for muscle spasms.  Dispense: 30 tablet; Refill: 0  3. Hypothyroidism, unspecified type - TSH  4. Colon cancer  screening - Fecal occult blood, imunochemical([facility])  Patient was given the opportunity to ask questions.  Patient verbalized understanding of the plan and was able to repeat key elements of the plan.   Orders Placed This Encounter  Procedures  . Fecal occult blood, imunochemical([facility])  . TSH  . Lipid panel     Requested Prescriptions   Signed Prescriptions Disp Refills  . traMADol (ULTRAM) 50 MG tablet 30 tablet 0    Sig: Take 1 tablet (50 mg total) by mouth 2 (two) times daily as needed.  . cyclobenzaprine (FLEXERIL) 5 MG tablet 30 tablet 0    Sig: Take 1 tablet (5 mg total) by mouth 2 (two) times daily as needed for muscle spasms.    Return in about 4 months (around 06/04/2018).  Karle Plumber, MD, FACP

## 2018-02-02 ENCOUNTER — Other Ambulatory Visit: Payer: Self-pay | Admitting: Internal Medicine

## 2018-02-02 LAB — LIPID PANEL
Chol/HDL Ratio: 5 ratio (ref 0.0–5.0)
Cholesterol, Total: 212 mg/dL — ABNORMAL HIGH (ref 100–199)
HDL: 42 mg/dL (ref >39)
LDL CALC: 108 mg/dL — AB (ref 0–99)
Triglycerides: 309 mg/dL — ABNORMAL HIGH (ref 0–149)
VLDL CHOLESTEROL CAL: 62 mg/dL — AB (ref 5–40)

## 2018-02-02 LAB — TSH: TSH: 1.19 u[IU]/mL (ref 0.450–4.500)

## 2018-02-02 MED ORDER — ATORVASTATIN CALCIUM 10 MG PO TABS: 10.0000 mg | ORAL_TABLET | Freq: Every day | ORAL | 3 refills | Status: DC

## 2018-02-04 ENCOUNTER — Telehealth: Payer: Self-pay

## 2018-02-04 NOTE — Telephone Encounter (Signed)
Contacted pt to go over lab results pt didn't answer left a detailed vm informing pt of results and if he has any questions or concerns to give me a call   If pt calls back please give results: thyroid level is normal. His cholesterol is elevated. I recommend starting medication called atorvastatin to help lower cholesterol and decrease risk for heart attack and strokes. Prescription has been sent to our pharmacy.

## 2018-02-11 MED FILL — LOSARTAN POTASSIUM 25 MG TA: 25 | 30 days supply | Qty: 30 | Fill #5

## 2018-02-11 MED FILL — DICLOFENAC SODIUM 1% GEL: 1 | 12 days supply | Qty: 100 | Fill #1

## 2018-02-16 LAB — FECAL OCCULT BLOOD, IMMUNOCHEMICAL: Fecal Occult Bld: NEGATIVE

## 2018-02-18 ENCOUNTER — Ambulatory Visit: Payer: Self-pay | Attending: Internal Medicine

## 2018-02-18 MED FILL — PANTOPRAZOLE SOD DR 40 MG T: 40 | 30 days supply | Qty: 30 | Fill #9

## 2018-02-23 ENCOUNTER — Ambulatory Visit: Payer: Self-pay

## 2018-02-23 MED FILL — LEVOTHYROXINE 175 MCG TAB: 175 | 30 days supply | Qty: 30 | Fill #6

## 2018-03-11 ENCOUNTER — Other Ambulatory Visit: Payer: Self-pay | Admitting: *Deleted

## 2018-03-11 DIAGNOSIS — I10 Essential (primary) hypertension: Secondary | ICD-10-CM

## 2018-03-11 MED ORDER — LOSARTAN POTASSIUM 25 MG PO TABS: 25.0000 mg | ORAL_TABLET | Freq: Every day | ORAL | 2 refills | Status: DC

## 2018-03-11 MED FILL — LOSARTAN POTASSIUM 25 MG TA: 25 | 30 days supply | Qty: 30 | Fill #0

## 2018-03-11 NOTE — Telephone Encounter (Signed)
Patient last seen 02/01/18 and advised to continue to keep BP at current goal.  Refill was placed.

## 2018-03-14 ENCOUNTER — Other Ambulatory Visit: Payer: Self-pay | Admitting: Internal Medicine

## 2018-03-14 DIAGNOSIS — M545 Low back pain, unspecified: Secondary | ICD-10-CM

## 2018-03-14 MED FILL — DICLOFENAC SODIUM 1% GEL: 1 | 12 days supply | Qty: 100 | Fill #2

## 2018-03-21 NOTE — Telephone Encounter (Signed)
Lvm informing pt that rx is ready for pick for tramadol

## 2018-03-21 NOTE — Telephone Encounter (Signed)
Received RF request on Flexeril and Tramadol.  Limited RF given on the latter.Pt last seen 02/01/2018.

## 2018-03-22 ENCOUNTER — Other Ambulatory Visit: Payer: Self-pay | Admitting: Internal Medicine

## 2018-03-22 DIAGNOSIS — E039 Hypothyroidism, unspecified: Secondary | ICD-10-CM

## 2018-03-22 MED FILL — PANTOPRAZOLE SOD DR 40 MG T: 40 | 30 days supply | Qty: 30 | Fill #10

## 2018-03-23 MED FILL — LEVOTHYROXINE 175 MCG TAB: 175 | 30 days supply | Qty: 30 | Fill #0

## 2018-03-25 ENCOUNTER — Encounter: Payer: Self-pay | Admitting: Internal Medicine

## 2018-03-25 ENCOUNTER — Ambulatory Visit: Payer: Self-pay | Attending: Internal Medicine | Admitting: Internal Medicine

## 2018-03-25 VITALS — BP 139/80 | HR 56 | Temp 98.0°F | Resp 16 | Wt 172.0 lb

## 2018-03-25 DIAGNOSIS — E785 Hyperlipidemia, unspecified: Secondary | ICD-10-CM | POA: Insufficient documentation

## 2018-03-25 DIAGNOSIS — G8929 Other chronic pain: Secondary | ICD-10-CM | POA: Insufficient documentation

## 2018-03-25 DIAGNOSIS — E559 Vitamin D deficiency, unspecified: Secondary | ICD-10-CM | POA: Insufficient documentation

## 2018-03-25 DIAGNOSIS — K2270 Barrett's esophagus without dysplasia: Secondary | ICD-10-CM | POA: Insufficient documentation

## 2018-03-25 DIAGNOSIS — Z79890 Hormone replacement therapy (postmenopausal): Secondary | ICD-10-CM | POA: Insufficient documentation

## 2018-03-25 DIAGNOSIS — R252 Cramp and spasm: Secondary | ICD-10-CM | POA: Insufficient documentation

## 2018-03-25 DIAGNOSIS — M545 Low back pain, unspecified: Secondary | ICD-10-CM

## 2018-03-25 DIAGNOSIS — J449 Chronic obstructive pulmonary disease, unspecified: Secondary | ICD-10-CM | POA: Insufficient documentation

## 2018-03-25 DIAGNOSIS — Z87891 Personal history of nicotine dependence: Secondary | ICD-10-CM | POA: Insufficient documentation

## 2018-03-25 DIAGNOSIS — M47816 Spondylosis without myelopathy or radiculopathy, lumbar region: Secondary | ICD-10-CM | POA: Insufficient documentation

## 2018-03-25 DIAGNOSIS — Z79891 Long term (current) use of opiate analgesic: Secondary | ICD-10-CM | POA: Insufficient documentation

## 2018-03-25 DIAGNOSIS — I10 Essential (primary) hypertension: Secondary | ICD-10-CM | POA: Insufficient documentation

## 2018-03-25 DIAGNOSIS — Z791 Long term (current) use of non-steroidal anti-inflammatories (NSAID): Secondary | ICD-10-CM | POA: Insufficient documentation

## 2018-03-25 DIAGNOSIS — E039 Hypothyroidism, unspecified: Secondary | ICD-10-CM | POA: Insufficient documentation

## 2018-03-25 DIAGNOSIS — Z801 Family history of malignant neoplasm of trachea, bronchus and lung: Secondary | ICD-10-CM | POA: Insufficient documentation

## 2018-03-25 DIAGNOSIS — Z79899 Other long term (current) drug therapy: Secondary | ICD-10-CM | POA: Insufficient documentation

## 2018-03-25 DIAGNOSIS — K219 Gastro-esophageal reflux disease without esophagitis: Secondary | ICD-10-CM | POA: Insufficient documentation

## 2018-03-25 DIAGNOSIS — Z823 Family history of stroke: Secondary | ICD-10-CM | POA: Insufficient documentation

## 2018-03-25 DIAGNOSIS — Z9889 Other specified postprocedural states: Secondary | ICD-10-CM | POA: Insufficient documentation

## 2018-03-25 DIAGNOSIS — Z8249 Family history of ischemic heart disease and other diseases of the circulatory system: Secondary | ICD-10-CM | POA: Insufficient documentation

## 2018-03-25 MED ORDER — DICLOFENAC SODIUM 1 % TD GEL: 2.0000 g | Freq: Four times a day (QID) | TRANSDERMAL | 5 refills | Status: DC

## 2018-03-25 MED ORDER — CYCLOBENZAPRINE HCL 5 MG PO TABS: ORAL_TABLET | ORAL | 1 refills | Status: DC

## 2018-03-25 NOTE — Progress Notes (Signed)
Patient ID: Darren [name], male    DOB: 09-03-1961  MRN: [id number]  CC: Back Pain   Subjective: Darren [name] is a 57 y.o. male who presents for urgent care visit. His concerns today include:  Pt with hx of GERD, hypothyroid,HTN,chronic LBP and Barrett's esophagus  Patient here today because he needs to reapply for [employer] discount.  He states that he was speaking with our financial counselor and was told that he needed to be seen by his PCP.  She was not aware that I had seen the patient just last month.  I went over labs results that were ordered on last visit.  His thyroid level was normal.  Cholesterol was elevated.  I recommended Lipitor.  However he did not get my lab message and so has not picked up medication as yet.  Still reports of some aching in his lower back.  He found the Flexeril and tramadol helpful.  He also finds the Voltaren gel helpful.  Requesting refill on Voltaren gel and Flexeril.  He had called last week to request a refill on tramadol which was given.  However he states that he has not been able to get it filled due to limited finances.  He tries to avoid doing too much lifting at work but he still has to carry pallets using a hand truck.  Complains of muscle spasms intermittently in the popliteal area and hamstring at nights for quite a while. Patient Active Problem List   Diagnosis Date Noted  . Epistaxis 11/04/2017  . Class 1 obesity due to excess calories without serious comorbidity with body mass index (BMI) of 31.0 to 31.9 in adult 11/04/2017  . Bilateral wrist pain 04/22/2017  . Disorder of left sacroiliac joint 12/21/2016  . Spondylosis without myelopathy or radiculopathy, lumbar region 12/21/2016  . Lumbar degenerative disc disease 12/21/2016  . Abnormal CT scan, esophagus 08/24/2016  . COPD (chronic obstructive pulmonary disease) with emphysema ([place]) 07/31/2016  . HTN (hypertension) 07/31/2016  . Dilation of thoracic aorta ([place]) 07/31/2016  .  Esophageal dysphagia 07/09/2016  . Chronic midline low back pain 07/09/2016  . Sensorineural hearing loss (SNHL), bilateral 03/30/2016  . Vitamin D deficiency 04/06/2014  . Hypothyroidism 10/31/2013  . GERD (gastroesophageal reflux disease) 11/25/2011  . Barrett esophagus 09/29/1999     Current Outpatient Medications on File Prior to Visit  Medication Sig Dispense Refill  . atorvastatin (LIPITOR) 10 MG tablet Take 1 tablet (10 mg total) by mouth daily. 90 tablet 3  . cyclobenzaprine (FLEXERIL) 5 MG tablet TAKE 1 TABLET BY MOUTH TWICE DAILY AS NEEDED FOR MUSCLE SPASMS 30 tablet 1  . diclofenac sodium (VOLTAREN) 1 % GEL Apply 2 g topically 4 (four) times daily. 100 g 5  . fluticasone (FLONASE) 50 MCG/ACT nasal spray Place 2 sprays into both nostrils daily. (Patient not taking: Reported on 10/11/2017) 16 g 0  . hydrOXYzine (ATARAX/VISTARIL) 25 MG tablet Take 1 tablet (25 mg total) by mouth 2 (two) times daily. 60 tablet 5  . levothyroxine (SYNTHROID, LEVOTHROID) 175 MCG tablet TAKE 1 TABLET BY MOUTH DAILY. 30 tablet 6  . loperamide (IMODIUM A-D) 2 MG tablet Take 1 tablet (2 mg total) by mouth 4 (four) times daily as needed for diarrhea or loose stools. 30 tablet 0  . losartan (COZAAR) 25 MG tablet Take 1 tablet (25 mg total) by mouth daily. 30 tablet 2  . naproxen (NAPROSYN) 500 MG tablet Take 1 tablet (500 mg total) by mouth 2 (two)  times daily with a meal. X 1 week then Prn pain 60 tablet 0  . ondansetron (ZOFRAN) 4 MG tablet Take 1 tablet (4 mg total) by mouth every 8 (eight) hours as needed for nausea or vomiting. 20 tablet 0  . pantoprazole (PROTONIX) 40 MG tablet Take 1 tablet (40 mg total) by mouth daily. 90 tablet 3  . Propyl Glycol-Hydroxyethylcell (NASAL MOIST) GEL Use as directed.  0  . sodium chloride (OCEAN) 0.65 % SOLN nasal spray Place 1 spray into both nostrils as needed for congestion.  0  . traMADol (ULTRAM) 50 MG tablet TAKE 1 TABLET BY MOUTH TWICE DAILY AS NEEDED 30 tablet 0     No current facility-administered medications on file prior to visit.     No Known Allergies  Social History   Socioeconomic History  . Marital status: Single    Spouse name: Not on file  . Number of children: 2  . Years of education: Not on file  . Highest education level: Not on file  Occupational History  . Not on file  Social Needs  . Financial resource strain: Not on file  . Food insecurity:    Worry: Not on file    Inability: Not on file  . Transportation needs:    Medical: Not on file    Non-medical: Not on file  Tobacco Use  . Smoking status: Former Smoker    Packs/day: 1.00    Years: 26.00    Pack years: 26.00    Types: Cigarettes    Last attempt to quit: 09/15/2004    Years since quitting: 13.5  . Smokeless tobacco: Never Used  . Tobacco comment: Quit in 2005  Substance and Sexual Activity  . Alcohol use: Yes    Comment: ON WEEKENDS  . Drug use: No  . Sexual activity: Never    Birth control/protection: None  Lifestyle  . Physical activity:    Days per week: Not on file    Minutes per session: Not on file  . Stress: Not on file  Relationships  . Social connections:    Talks on phone: Not on file    Gets together: Not on file    Attends religious service: Not on file    Active member of club or organization: Not on file    Attends meetings of clubs or organizations: Not on file    Relationship status: Not on file  . Intimate partner violence:    Fear of current or ex partner: Not on file    Emotionally abused: Not on file    Physically abused: Not on file    Forced sexual activity: Not on file  Other Topics Concern  . Not on file  Social History Narrative   Lives alone.      Family History  Problem Relation Age of Onset  . Cancer Father        lung  . Stroke Father   . Heart attack Father 20  . Lung cancer Mother   . Thyroid disease Sister   . Colon cancer Neg Hx   . Esophageal cancer Neg Hx     Past Surgical History:  Procedure  Laterality Date  . BIOPSY  09/18/2016   Procedure: BIOPSY;  Surgeon: Daneil Dolin, MD;  Location: [facility];  Service: Endoscopy;;  biopsy of distal esophagus  . ESOPHAGOGASTRODUODENOSCOPY  2001   barrett's per path  . ESOPHAGOGASTRODUODENOSCOPY (EGD) WITH PROPOFOL N/A 09/18/2016   Procedure: ESOPHAGOGASTRODUODENOSCOPY (EGD) WITH PROPOFOL;  Surgeon: Daneil Dolin, MD;  Location: [facility];  Service: Endoscopy;  Laterality: N/A;  1200  . [place]  . MULTIPLE EXTRACTIONS WITH ALVEOLOPLASTY  11/30/2011   Procedure: MULTIPLE EXTRACION WITH ALVEOLOPLASTY;  Surgeon: Gae Bon, DDS;  Location: [facility];  Service: Oral Surgery;  Laterality: Bilateral;    ROS: Review of Systems Negative except as stated above PHYSICAL EXAM: BP 139/80   Pulse (!) 56   Temp 98 F (36.7 C) (Oral)   Resp 16   Wt 172 lb (78 kg)   SpO2 95%   BMI 30.47 kg/m   Physical Exam  General appearance - alert, well appearing, and in no distress Musculoskeletal -no tenderness on palpation of the hamstrings or the gastroc muscles.  Good peripheral pulses.  Results for orders placed or performed in visit on 02/01/18  Fecal occult blood, imunochemical([facility])  Result Value Ref Range   Fecal Occult Bld Negative Negative  TSH  Result Value Ref Range   TSH 1.190 0.450 - 4.500 uIU/mL  Lipid panel  Result Value Ref Range   Cholesterol, Total 212 (H) 100 - 199 mg/dL   Triglycerides 309 (H) 0 - 149 mg/dL   HDL 42 >39 mg/dL   VLDL Cholesterol Cal 62 (H) 5 - 40 mg/dL   LDL Calculated 108 (H) 0 - 99 mg/dL   Chol/HDL Ratio 5.0 0.0 - 5.0 ratio    ASSESSMENT AND PLAN: 1. Acute midline low back pain without sciatica We will refill Flexeril and Voltaren gel.  Try to avoid heavy lifting.  2. Muscle cramps Flexeril as needed.  3. Hyperlipidemia, unspecified hyperlipidemia type We will try low-dose Lipitor.  However patient advised that if it increases his muscle cramps he should let  me know.  Patient was given the opportunity to ask questions.  Patient verbalized understanding of the plan and was able to repeat key elements of the plan.   No orders of the defined types were placed in this encounter.    Requested Prescriptions    No prescriptions requested or ordered in this encounter    No follow-ups on file.  Karle Plumber, MD, FACP

## 2018-04-08 IMAGING — DX DG CHEST 2V
2 series · 2 of 2 positions shown · non-contrast
Comparison: 07/14/2016 and 11/07/2015

CLINICAL DATA: Left-sided chest pain.

EXAM:
CHEST  2 VIEW

[chest pa]
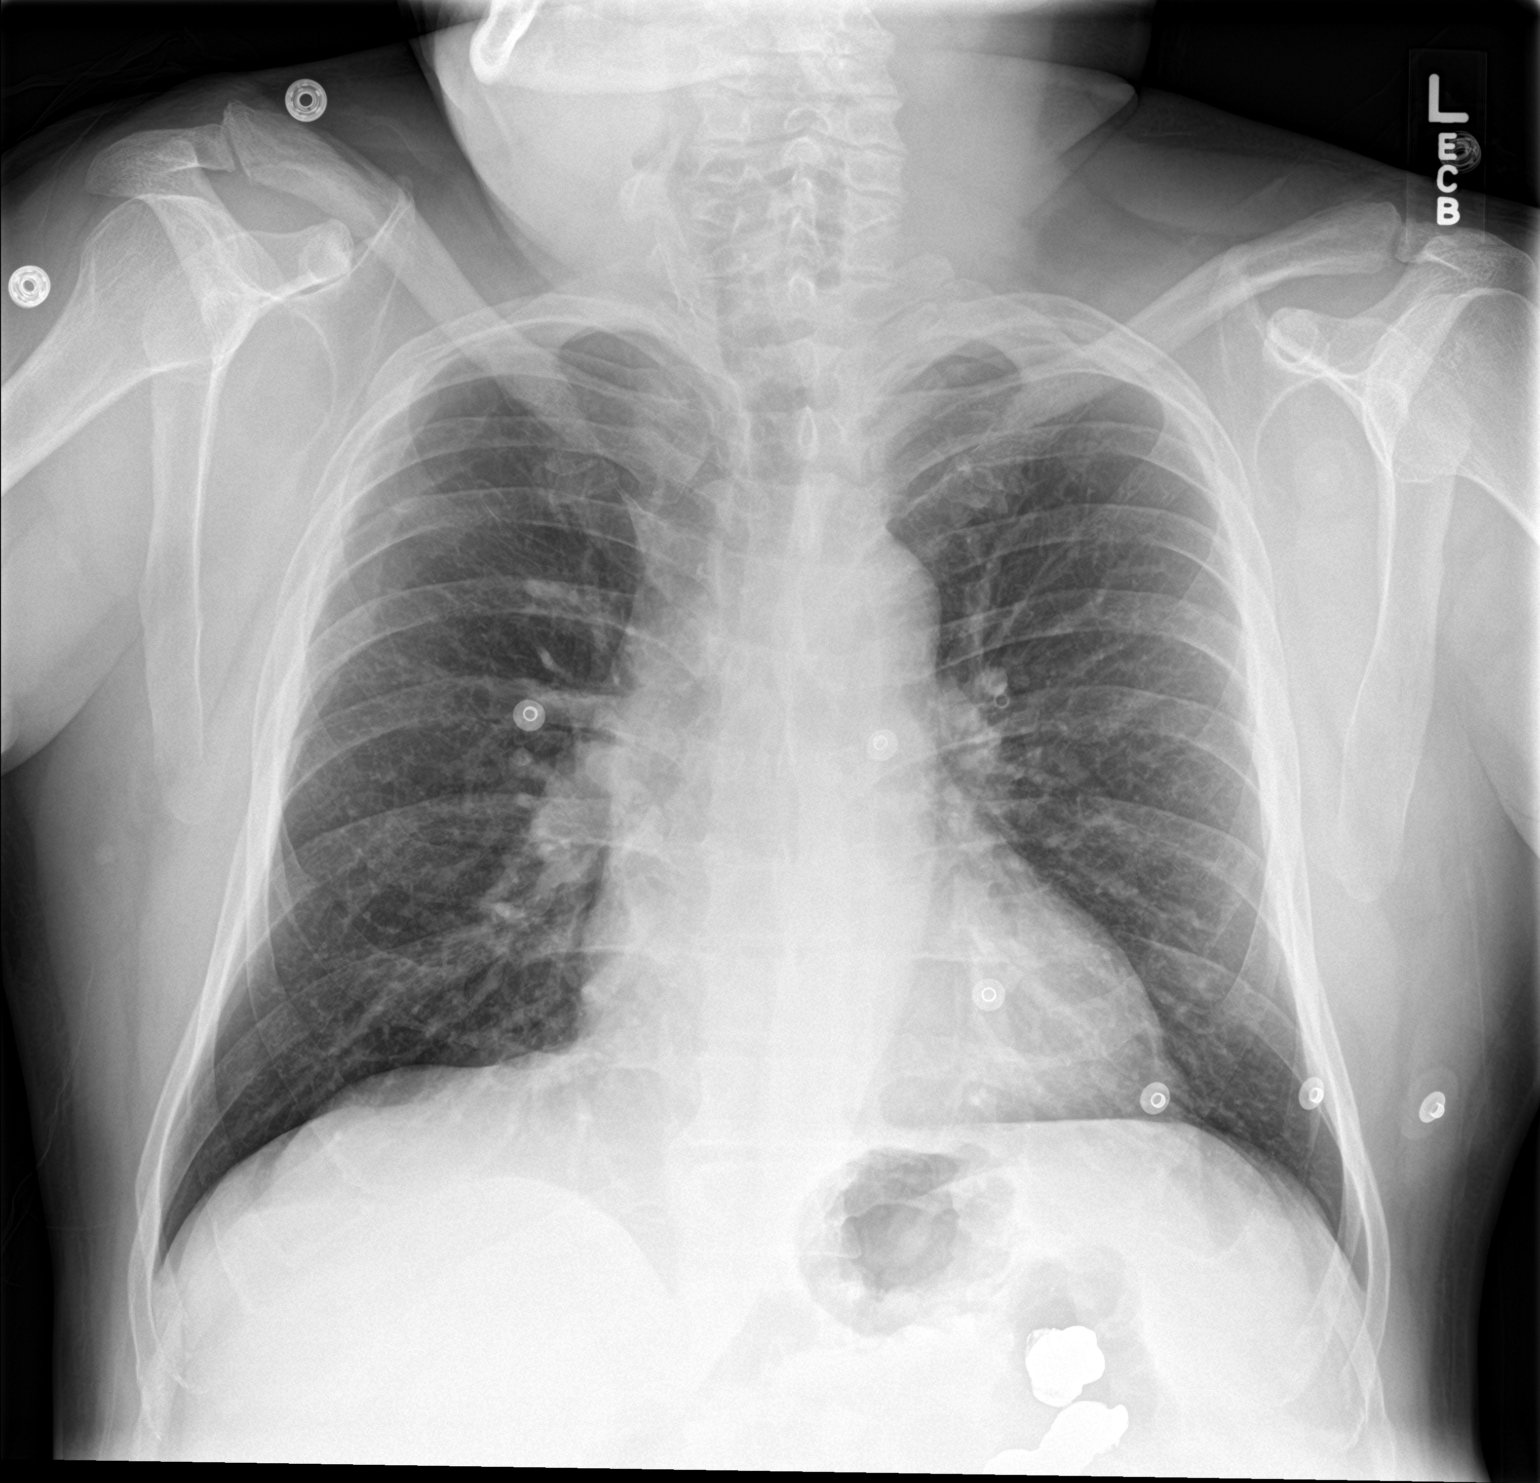

[chest lat]
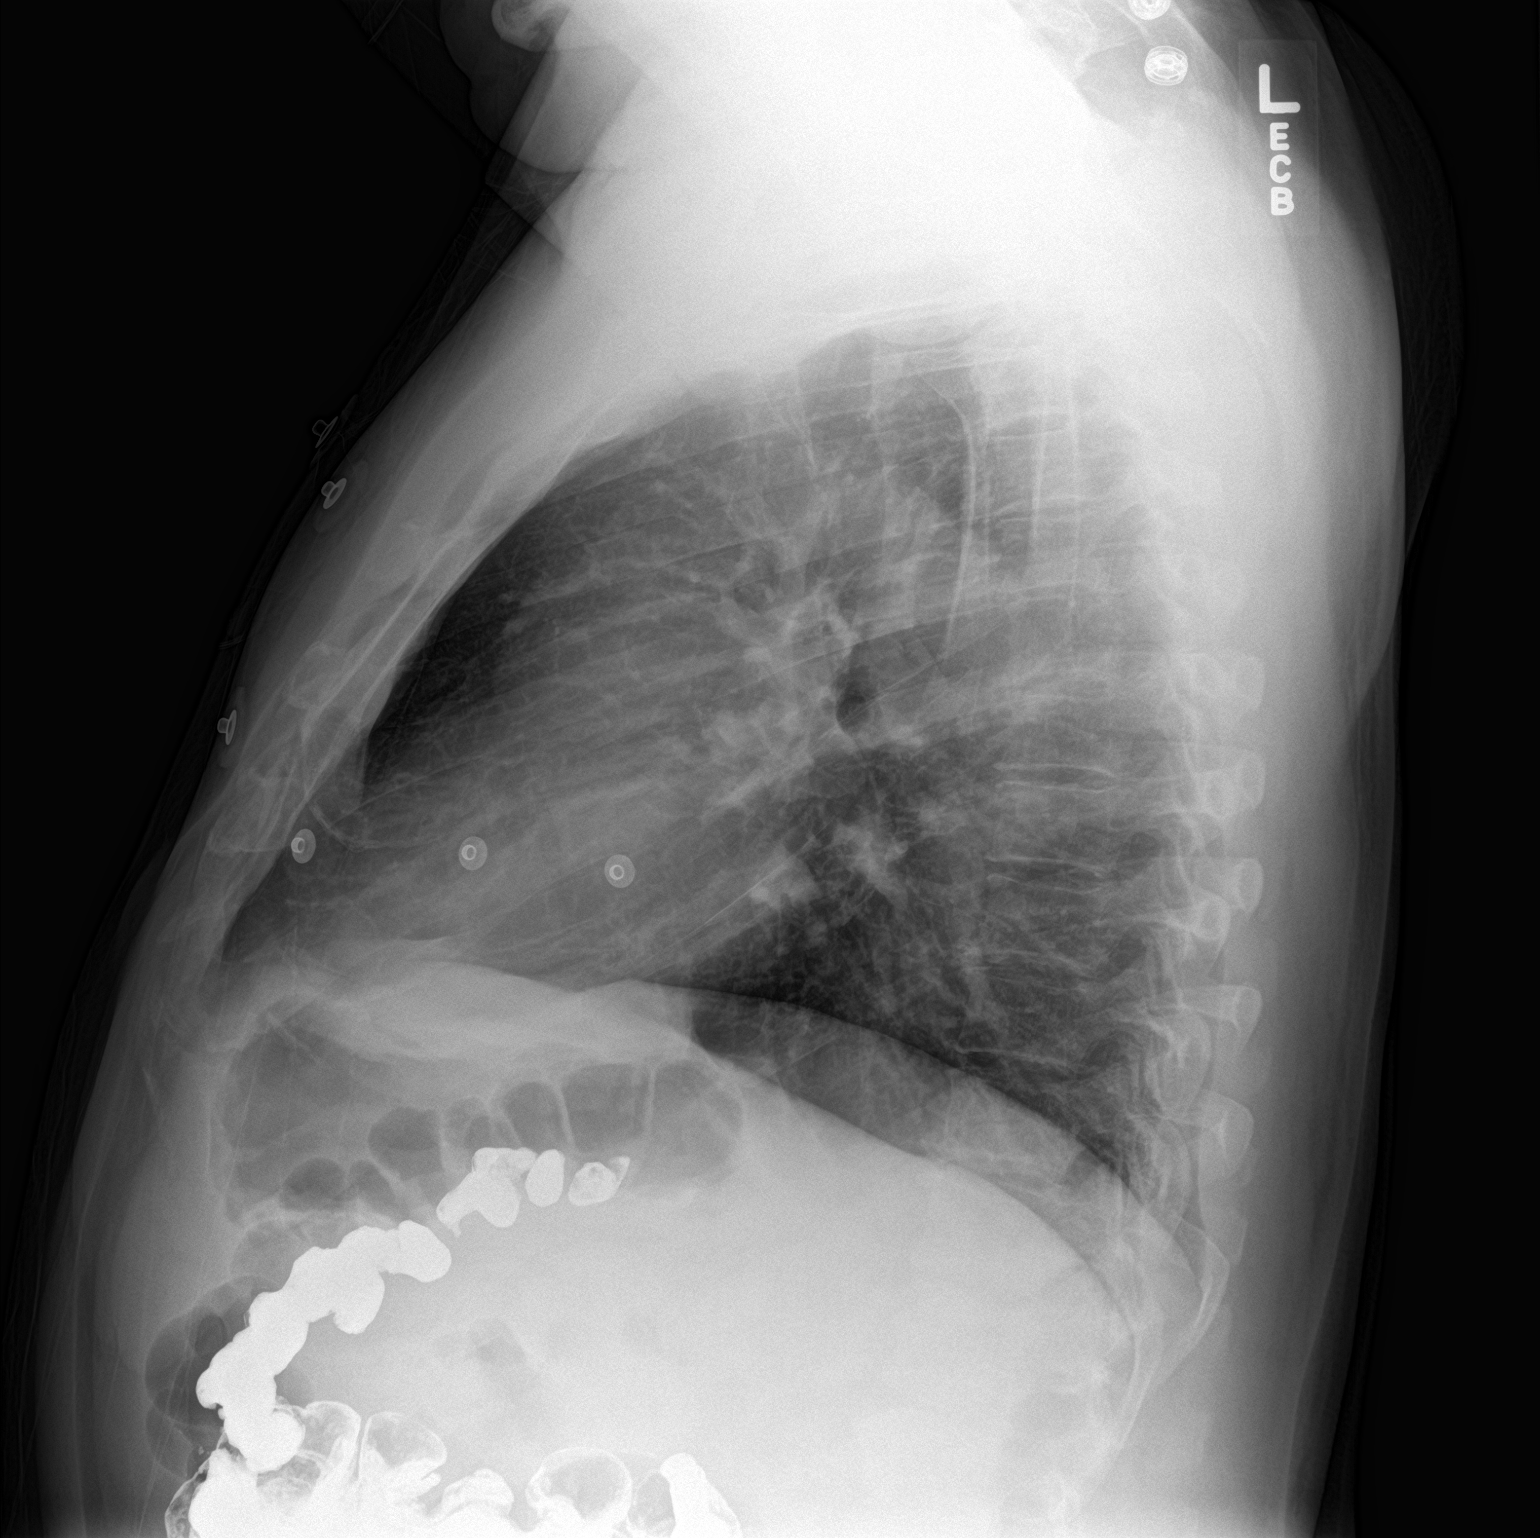

[2 of 2 positions shown; findings below may reference images not displayed]

FINDINGS: Lungs are clear. There may be chronic pleural thickening in the
right upper chest. Heart size is within normal limits and stable.
The trachea is midline. There is oral contrast in the colon. No
large pleural effusions. No acute bone abnormality.
IMPRESSION: No active cardiopulmonary disease.

## 2018-04-08 IMAGING — DX DG ANKLE 2V *R*
2 series · 2 of 2 positions shown · non-contrast
Comparison: None

CLINICAL DATA: Awoke 3 days ago with RIGHT ankle pain especially
when standing, acute onset, no known injury

EXAM:
RIGHT ANKLE - 2 VIEW

[ankle lat]
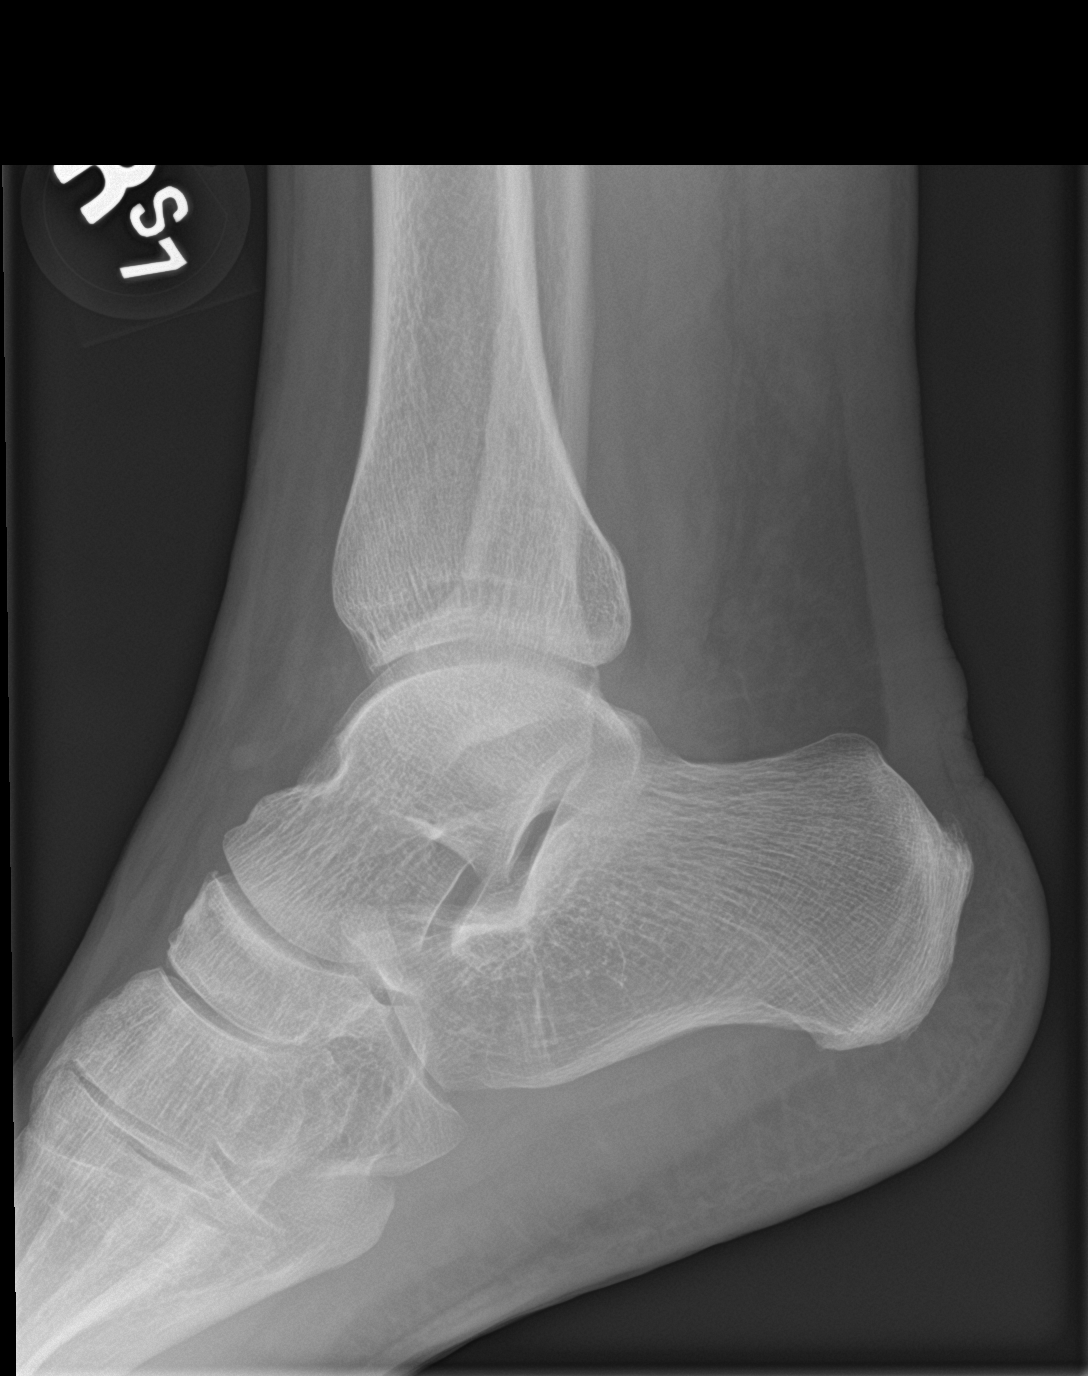

[ankle ap]
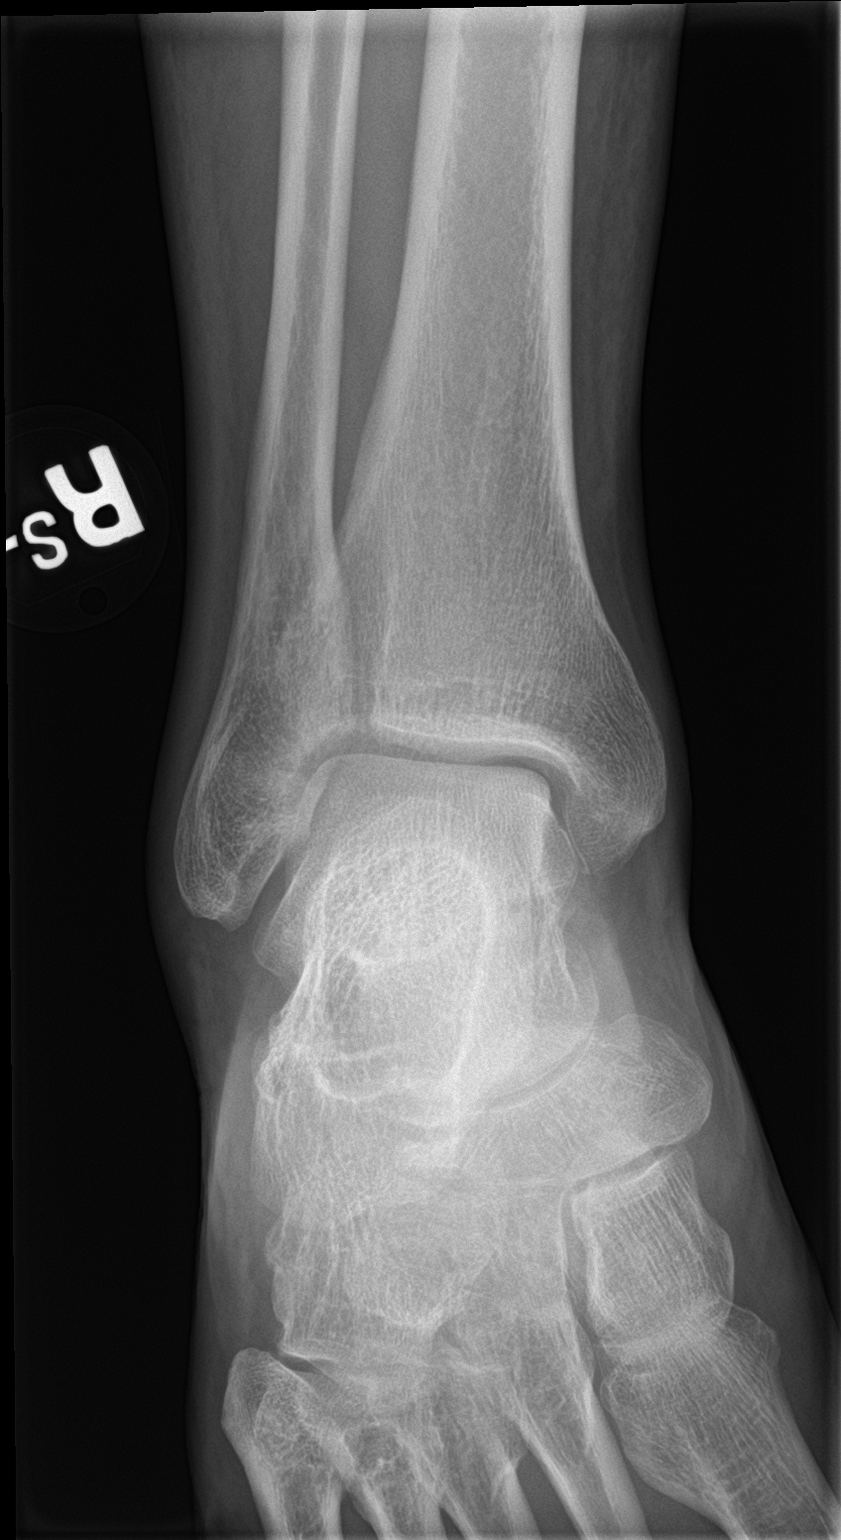

[2 of 2 positions shown; findings below may reference images not displayed]

FINDINGS: Osseous mineralization grossly normal for technique.

Joint spaces preserved.

No acute fracture, dislocation or bone destruction.

Mild lateral soft tissue swelling.
IMPRESSION: No acute osseous abnormalities.

## 2018-04-08 IMAGING — RF DG ESOPHAGUS
9 series · 13 of 13 positions shown · non-contrast
Comparison: No priors.

CLINICAL DATA: 55-year-old male with history of hoarseness. Painful
swallowing with solids and occasionally with liquids.

EXAM:
ESOPHOGRAM / BARIUM SWALLOW / BARIUM TABLET STUDY
TECHNIQUE: Combined double contrast and single contrast examination performed
using effervescent crystals, thick barium liquid, and thin barium
liquid. The patient was observed with fluoroscopy swallowing a 13 mm
barium sulphate tablet.
FLUOROSCOPY TIME:  Fluoroscopy Time:  1 minutes and 30 seconds.

[Series 1: fluoro_barium 2fps_bw · 0.17mm/px · 1 of 1 slices shown (1 of 5)]
[im 1/1]
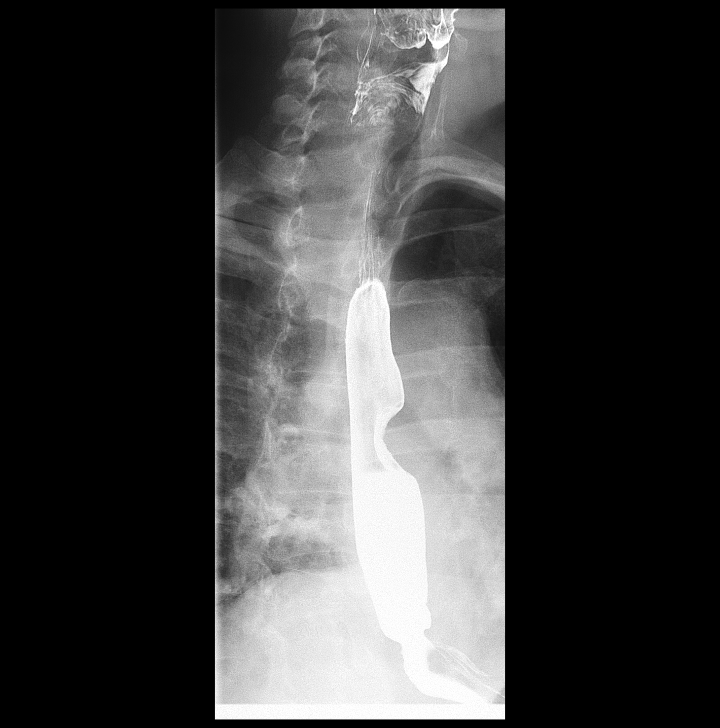

[Series 2: fluoro_barium 2fps_bw · 0.17mm/px · 1 of 1 slices shown (2 of 5)]
[im 1/1]
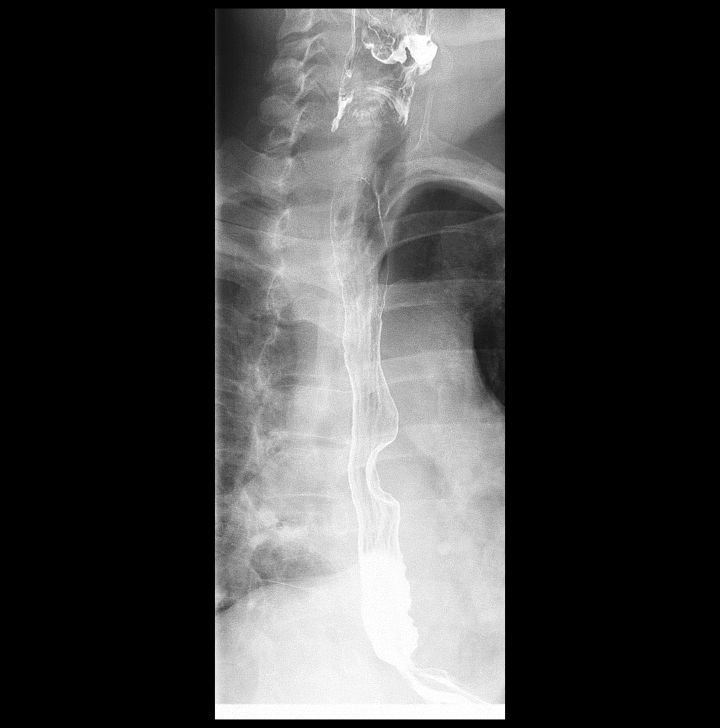

[Series 3: fluoro_barium 2fps_bw · 0.17mm/px · 1 of 1 slices shown (3 of 5)]
[im 1/1]
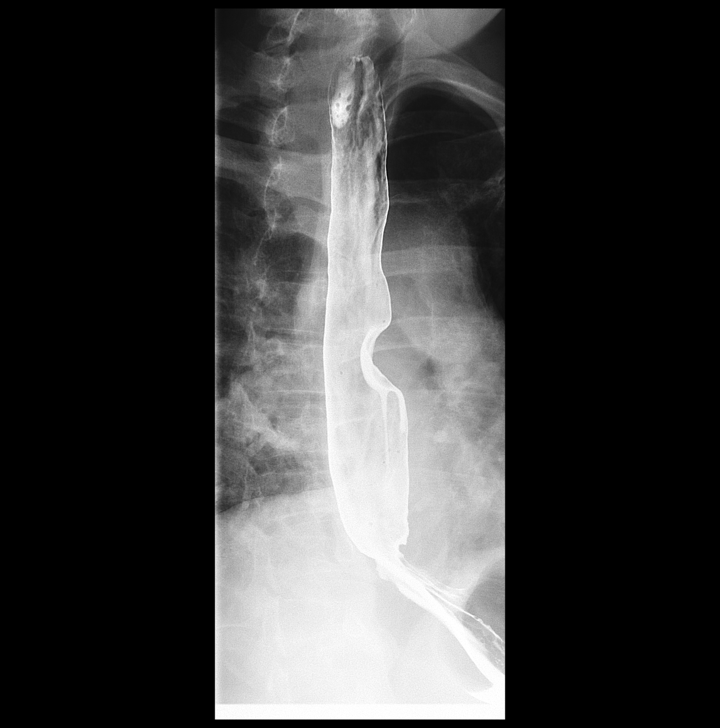

[Series 4: cp_standard · 0.57mm/px · 3 of 25 frames shown (1 of 4)]
[frame 4/25]
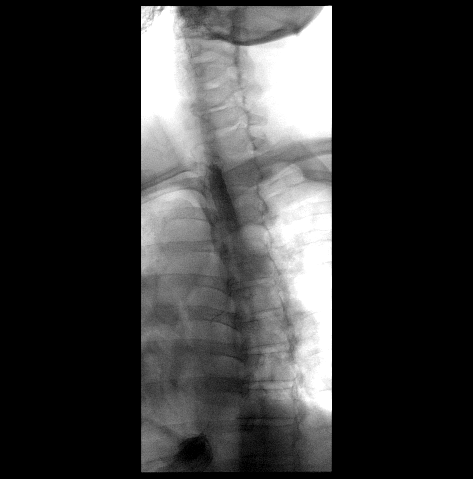
[frame 13/25]
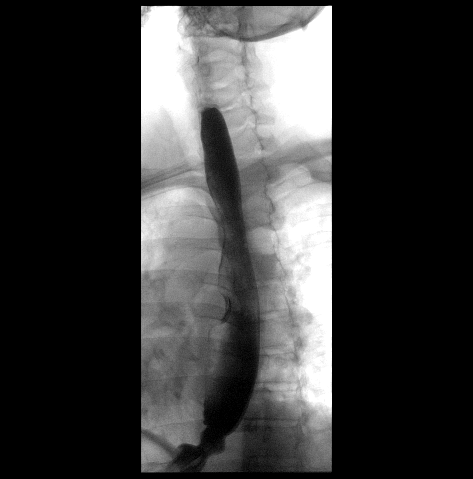
[frame 22/25]
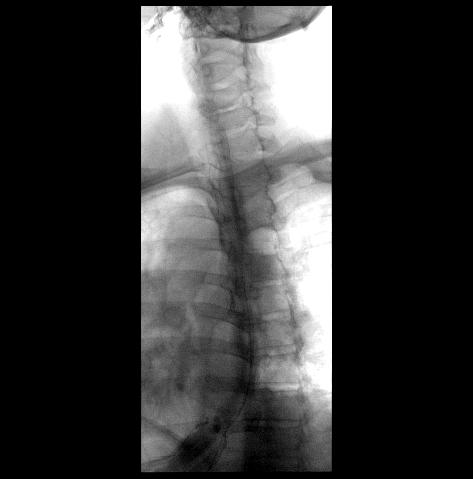

[Series 5: fluoro_barium 2fps_bw · 0.19mm/px · 1 of 1 slices shown (4 of 5)]
[im 1/1]
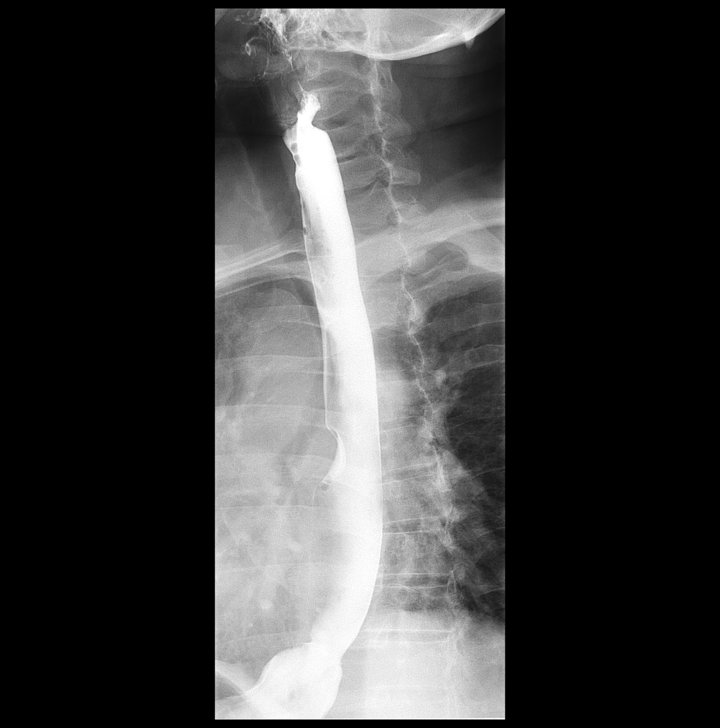

[Series 6: fluoro_barium 2fps_bw · 0.19mm/px · 1 of 1 slices shown (5 of 5)]
[im 1/1]
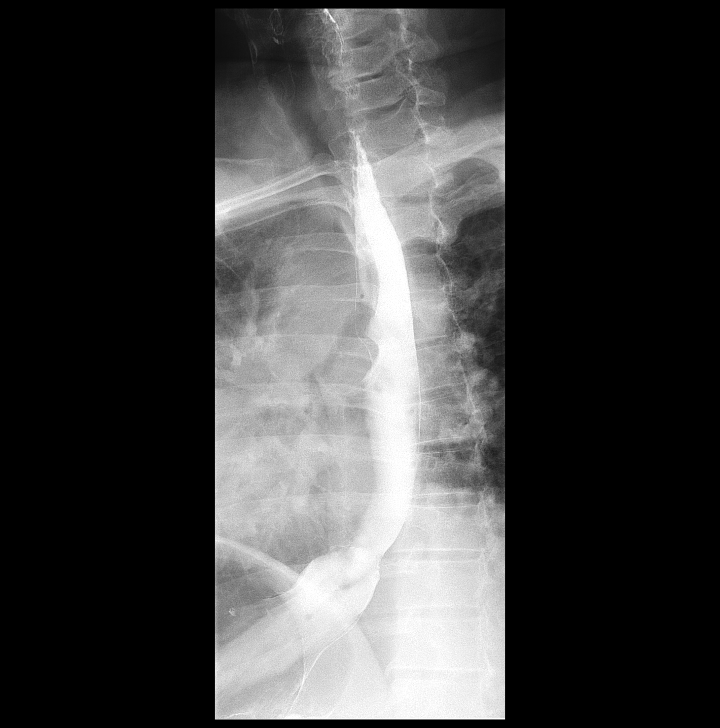

[Series 7: cp_standard · 0.57mm/px · 3 of 6 frames shown (2 of 4)]
[frame 1/6]
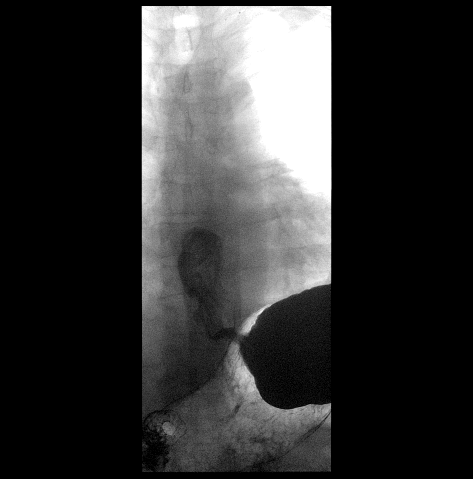
[frame 4/6]
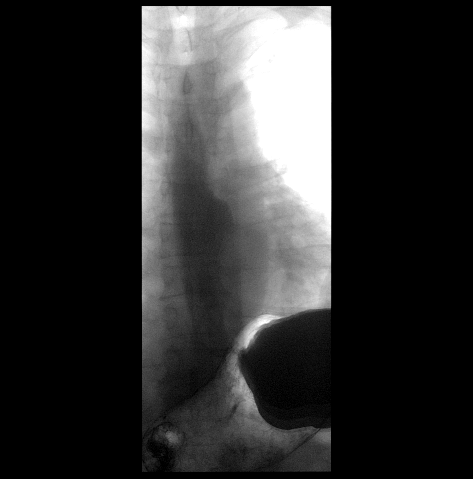
[frame 6/6]
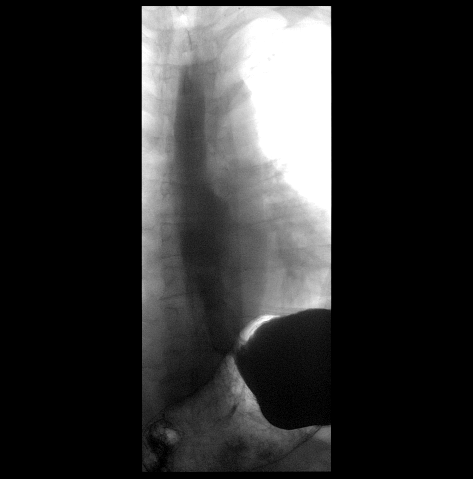

[Series 8: cp_standard · 0.29mm/px · 1 of 1 slices shown (3 of 4)]
[im 1/1]
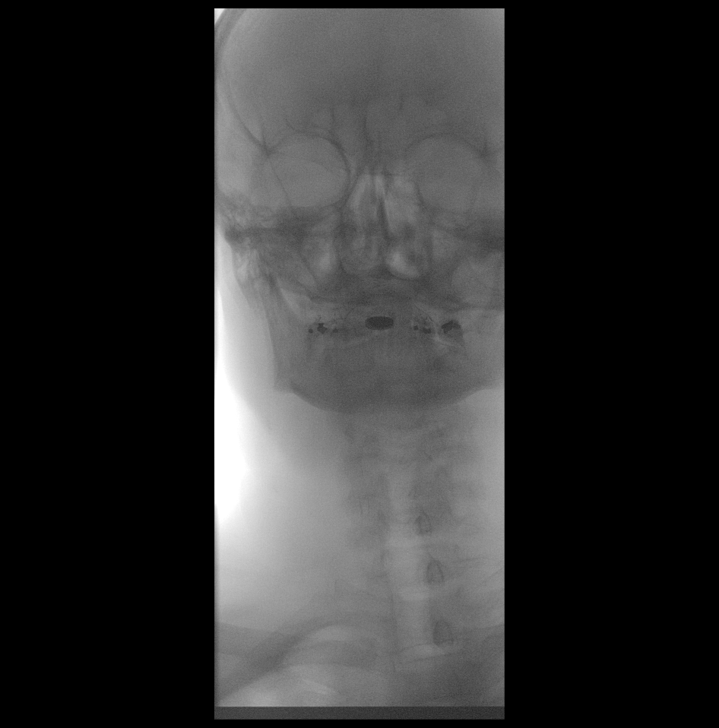

[Series 9: cp_standard · 0.28mm/px · 1 of 1 slices shown (4 of 4)]
[im 1/1]
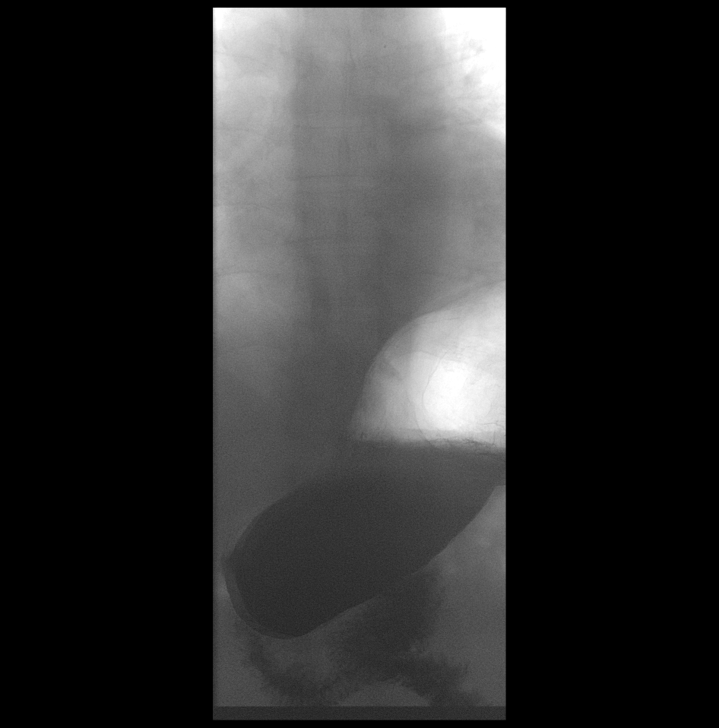

[13 of 13 positions shown; findings below may reference images not displayed]

FINDINGS: Initial double contrast images of the esophagus demonstrated a
smoothly marginated mucosal abnormality along the anterior surface
of the middle third of the esophagus, which is favored to represent
mass effect from an extrinsic structure to the esophagus in the
adjacent mediastinum. The mucosa is otherwise normal. Multiple
single swallow attempts were observed, which demonstrated normal
esophageal motility. Full column esophagram demonstrated no
esophageal mass, stricture or esophageal ring. Small hiatal hernia.
Water siphon test demonstrated extensive gastroesophageal reflux
into the proximal third of the esophagus. A barium tablet was
administered, which passed readily into the stomach.
IMPRESSION: 1. Today's esophogram demonstrates a filling defect along the
anterior surface of the middle third of the esophagus, which is
favored to be related to extrinsic compression from an adjacent
structure in the mediastinum. Differential considerations include a
prominent blood vessel such as a pulmonary artery, mediastinal lymph
node, or other mediastinal mass. This is less likely to represent a
submucosal lesion associated with the esophagus. Initial evaluation
with contrast enhanced chest CT is recommended in the near future to
better evaluate this finding.
These results will be called to the ordering clinician or
representative by the Radiologist Assistant, and communication
documented in the PACS or zVision Dashboard.

## 2018-04-13 MED FILL — LOSARTAN POTASSIUM 25 MG TA: 25 | 30 days supply | Qty: 30 | Fill #1

## 2018-04-26 MED FILL — LEVOTHYROXINE 175 MCG TAB: 175 | 30 days supply | Qty: 30 | Fill #1

## 2018-04-26 MED FILL — DICLOFENAC SODIUM 1% GEL: 1 | 12 days supply | Qty: 100 | Fill #3

## 2018-04-29 ENCOUNTER — Other Ambulatory Visit: Payer: Self-pay

## 2018-04-29 DIAGNOSIS — K219 Gastro-esophageal reflux disease without esophagitis: Secondary | ICD-10-CM

## 2018-04-29 MED ORDER — PANTOPRAZOLE SODIUM 40 MG PO TBEC: 40.0000 mg | DELAYED_RELEASE_TABLET | Freq: Every day | ORAL | 2 refills | Status: DC

## 2018-04-29 MED FILL — ?PANTOPRAZOLE SO DR 40MG TA: 40 | 30 days supply | Qty: 30 | Fill #0

## 2018-05-13 MED FILL — LOSARTAN POTASSIUM 25 MG TA: 25 | 30 days supply | Qty: 30 | Fill #2

## 2018-05-25 MED FILL — LEVOTHYROXINE 175 MCG TAB: 175 | 30 days supply | Qty: 30 | Fill #2

## 2018-05-25 MED FILL — ?PANTOPRAZOLE SO DR 40MG TA: 40 | 30 days supply | Qty: 30 | Fill #1

## 2018-05-27 MED FILL — DICLOFENAC SODIUM 1% GEL: 1 | 12 days supply | Qty: 100 | Fill #4

## 2018-06-06 ENCOUNTER — Ambulatory Visit: Payer: Self-pay | Admitting: Internal Medicine

## 2018-06-17 MED FILL — LOSARTAN POTASSIUM 25 MG TA: 25 | 30 days supply | Qty: 30 | Fill #0

## 2018-06-17 MED FILL — DICLOFENAC SODIUM 1% GEL: 1 | 12 days supply | Qty: 100 | Fill #5

## 2018-06-23 ENCOUNTER — Ambulatory Visit: Payer: Self-pay | Attending: Family Medicine | Admitting: Family Medicine

## 2018-06-23 DIAGNOSIS — H903 Sensorineural hearing loss, bilateral: Secondary | ICD-10-CM | POA: Insufficient documentation

## 2018-06-23 DIAGNOSIS — Z760 Encounter for issue of repeat prescription: Secondary | ICD-10-CM | POA: Insufficient documentation

## 2018-06-23 DIAGNOSIS — Z87891 Personal history of nicotine dependence: Secondary | ICD-10-CM | POA: Insufficient documentation

## 2018-06-23 DIAGNOSIS — M25532 Pain in left wrist: Secondary | ICD-10-CM | POA: Insufficient documentation

## 2018-06-23 DIAGNOSIS — J449 Chronic obstructive pulmonary disease, unspecified: Secondary | ICD-10-CM | POA: Insufficient documentation

## 2018-06-23 DIAGNOSIS — R1314 Dysphagia, pharyngoesophageal phase: Secondary | ICD-10-CM | POA: Insufficient documentation

## 2018-06-23 DIAGNOSIS — K209 Esophagitis, unspecified: Secondary | ICD-10-CM | POA: Insufficient documentation

## 2018-06-23 DIAGNOSIS — I10 Essential (primary) hypertension: Secondary | ICD-10-CM | POA: Insufficient documentation

## 2018-06-23 DIAGNOSIS — E039 Hypothyroidism, unspecified: Secondary | ICD-10-CM | POA: Insufficient documentation

## 2018-06-23 DIAGNOSIS — G8929 Other chronic pain: Secondary | ICD-10-CM | POA: Insufficient documentation

## 2018-06-23 DIAGNOSIS — M5136 Other intervertebral disc degeneration, lumbar region: Secondary | ICD-10-CM | POA: Insufficient documentation

## 2018-06-23 DIAGNOSIS — M25531 Pain in right wrist: Secondary | ICD-10-CM | POA: Insufficient documentation

## 2018-06-23 DIAGNOSIS — R040 Epistaxis: Secondary | ICD-10-CM | POA: Insufficient documentation

## 2018-06-23 DIAGNOSIS — K219 Gastro-esophageal reflux disease without esophagitis: Secondary | ICD-10-CM | POA: Insufficient documentation

## 2018-06-23 DIAGNOSIS — K2270 Barrett's esophagus without dysplasia: Secondary | ICD-10-CM | POA: Insufficient documentation

## 2018-06-23 DIAGNOSIS — Z79899 Other long term (current) drug therapy: Secondary | ICD-10-CM | POA: Insufficient documentation

## 2018-06-23 DIAGNOSIS — E559 Vitamin D deficiency, unspecified: Secondary | ICD-10-CM | POA: Insufficient documentation

## 2018-06-23 DIAGNOSIS — E6609 Other obesity due to excess calories: Secondary | ICD-10-CM | POA: Insufficient documentation

## 2018-06-23 DIAGNOSIS — M47816 Spondylosis without myelopathy or radiculopathy, lumbar region: Secondary | ICD-10-CM | POA: Insufficient documentation

## 2018-06-23 MED ORDER — PANTOPRAZOLE SODIUM 40 MG PO TBEC: 40.0000 mg | DELAYED_RELEASE_TABLET | Freq: Every day | ORAL | 2 refills | Status: DC

## 2018-06-23 MED ORDER — PREDNISONE 20 MG PO TABS: ORAL_TABLET | ORAL | 0 refills | Status: DC

## 2018-06-23 MED ORDER — LEVOTHYROXINE SODIUM 175 MCG PO TABS: 175.0000 ug | ORAL_TABLET | Freq: Every day | ORAL | 6 refills | Status: DC

## 2018-06-23 MED FILL — ?PANTOPRAZOLE SO DR 40MG TA: 40 | 30 days supply | Qty: 30 | Fill #0

## 2018-06-23 NOTE — Patient Instructions (Addendum)
Please notify use here in office if blood pressure is consistently greater than 140/100.  I have prescribed prednisone for your current back symptoms. Take Prednisone 20 mg,  in mornings with breakfast as follows:  Take 3 pills for 3 days, Take 2 pills for 3 days, and Take 1 pill for 3 days.  Complete all medication. Avoid taking ibuprofen or naproxen while taking prednisone. Ok to take Tylenol.     Hypertension Hypertension is another name for high blood pressure. High blood pressure forces your heart to work harder to pump blood. This can cause problems over time. There are two numbers in a blood pressure reading. There is a top number (systolic) over a bottom number (diastolic). It is best to have a blood pressure below 120/80. Healthy choices can help lower your blood pressure. You may need medicine to help lower your blood pressure if:  Your blood pressure cannot be lowered with healthy choices.  Your blood pressure is higher than 130/80.  Follow these instructions at home: Eating and drinking  If directed, follow the DASH eating plan. This diet includes: ? Filling half of your plate at each meal with fruits and vegetables. ? Filling one quarter of your plate at each meal with whole grains. Whole grains include whole wheat pasta, brown rice, and whole grain bread. ? Eating or drinking low-fat dairy products, such as skim milk or low-fat yogurt. ? Filling one quarter of your plate at each meal with low-fat (lean) proteins. Low-fat proteins include fish, skinless chicken, eggs, beans, and tofu. ? Avoiding fatty meat, cured and processed meat, or chicken with skin. ? Avoiding premade or processed food.  Eat less than 1,500 mg of salt (sodium) a day.  Limit alcohol use to no more than 1 drink a day for nonpregnant women and 2 drinks a day for men. One drink equals 12 oz of beer, 5 oz of wine, or 1 oz of hard liquor. Lifestyle  Work with your doctor to stay at a healthy weight or to  lose weight. Ask your doctor what the best weight is for you.  Get at least 30 minutes of exercise that causes your heart to beat faster (aerobic exercise) most days of the week. This may include walking, swimming, or biking.  Get at least 30 minutes of exercise that strengthens your muscles (resistance exercise) at least 3 days a week. This may include lifting weights or pilates.  Do not use any products that contain nicotine or tobacco. This includes cigarettes and e-cigarettes. If you need help quitting, ask your doctor.  Check your blood pressure at home as told by your doctor.  Keep all follow-up visits as told by your doctor. This is important. Medicines  Take over-the-counter and prescription medicines only as told by your doctor. Follow directions carefully.  Do not skip doses of blood pressure medicine. The medicine does not work as well if you skip doses. Skipping doses also puts you at risk for problems.  Ask your doctor about side effects or reactions to medicines that you should watch for. Contact a doctor if:  You think you are having a reaction to the medicine you are taking.  You have headaches that keep coming back (recurring).  You feel dizzy.  You have swelling in your ankles.  You have trouble with your vision. Get help right away if:  You get a very bad headache.  You start to feel confused.  You feel weak or numb.  You feel faint.  You get very bad pain in your: ? Chest. ? Belly (abdomen).  You throw up (vomit) more than once.  You have trouble breathing. Summary  Hypertension is another name for high blood pressure.  Making healthy choices can help lower blood pressure. If your blood pressure cannot be controlled with healthy choices, you may need to take medicine. This information is not intended to replace advice given to you by your health care provider. Make sure you discuss any questions you have with your health care provider. Document  Released: 03/02/2008 Document Revised: 08/12/2016 Document Reviewed: 08/12/2016 Elsevier Interactive Patient Education  [employer].

## 2018-06-23 NOTE — Progress Notes (Signed)
Darren [name], is a 57 y.o. male  [id number]  KVQ:[id number]  DOB - 09-26-1961  CC:  Chief Complaint  Patient presents with  . Medication Refill    needs a refill on his Protonix       HPI: Darren [name] is a 57 y.o. male is here for a medication follow-up.  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  Today's Concern:  Medication Refill Requesting a refill of Protonix. Symptoms of GERD well controlled with current dose. No recent coughing, burning in throat, vomiting, or abdominal pain.  Back Pain Chronic problem. Awaiting disability benefits however, had to return to work which requires heavy lifting. Back in is persistent throbbing. Occasionally radiates into the lower legs. Denies numbness of lower extremities although suffers from paraesthesia when pain is at highest level.  Previously took Naproxen chronically however stopped due to concern for renal damage.  Denies symptoms related to equina cauda, weakness, new headaches, chest pain, abdominal pain, nausea, new weakness , SOB, edema, or worrisome cough.   Current medications: Current Outpatient Medications:  .  diclofenac sodium (VOLTAREN) 1 % GEL, Apply 2 g topically 4 (four) times daily., Disp: 100 g, Rfl: 5 .  levothyroxine (SYNTHROID, LEVOTHROID) 175 MCG  tablet, TAKE 1 TABLET BY MOUTH DAILY., Disp: 30 tablet, Rfl: 6 .  losartan (COZAAR) 25 MG tablet, Take 1 tablet (25 mg total) by mouth daily., Disp: 30 tablet, Rfl: 2 .  naproxen (NAPROSYN) 500 MG tablet, Take 1 tablet (500 mg total) by mouth 2 (two) times daily with a meal. X 1 week then Prn pain, Disp: 60 tablet, Rfl: 0 .  pantoprazole (PROTONIX) 40 MG tablet, Take 1 tablet (40 mg total) by mouth daily., Disp: 90 tablet, Rfl: 2   Pertinent family medical history: family history includes Cancer in his father; Heart attack (age of onset: 91) in his father; Lung cancer in his mother; Stroke in his father; Thyroid disease in his sister.   No Known Allergies  Social History   Socioeconomic History  . Marital status: Single    Spouse name: Not on file  . Number of children: 2  . Years of education: Not on file  . Highest education level: Not on file  Occupational History  . Not on file  Social Needs  . Financial resource strain: Not on file  . Food insecurity:    Worry: Not on file    Inability: Not on file  . Transportation needs:    Medical: Not on file    Non-medical: Not on file  Tobacco Use  . Smoking status: Former Smoker    Packs/day: 1.00    Years: 26.00    Pack years: 26.00    Types: Cigarettes    Last attempt to quit: 09/15/2004    Years since quitting: 13.7  . Smokeless tobacco: Never Used  . Tobacco comment: Quit in 2005  Substance and Sexual Activity  . Alcohol use: Yes    Comment: ON WEEKENDS  .  Drug use: No  . Sexual activity: Never    Birth control/protection: None  Lifestyle  . Physical activity:    Days per week: Not on file    Minutes per session: Not on file  . Stress: Not on file  Relationships  . Social connections:    Talks on phone: Not on file    Gets together: Not on file    Attends religious service: Not on file    Active member of club or organization: Not on file    Attends meetings of clubs or organizations: Not on file    Relationship  status: Not on file  . Intimate partner violence:    Fear of current or ex partner: Not on file    Emotionally abused: Not on file    Physically abused: Not on file    Forced sexual activity: Not on file  Other Topics Concern  . Not on file  Social History Narrative   Lives alone.      Review of Systems: Constitutional: Negative for fever, chills, diaphoresis, activity change, appetite change and fatigue. Respiratory: Negative for cough, choking, chest tightness, shortness of breath, wheezing and stridor.  Cardiovascular: Negative for chest pain, palpitations and leg swelling. Gastrointestinal: Negative for abdominal distention. Musculoskeletal: Positive for back pain,  Psychiatric/Behavioral: Negative for hallucinations, behavioral problems, confusion, dysphoric mood, decreased concentration and agitation.   Objective:   Vitals:   06/23/18 1616  BP: (!) 155/95  Pulse: 63  Resp: 17  Temp: 98.1 F (36.7 C)  SpO2: 96%    Physical Exam: Constitutional: Patient appears well-developed and well-nourished. No distress. CVS: RRR, S1/S2 +, no murmurs, no gallops, no carotid bruit.  Pulmonary: Effort and breath sounds normal, no stridor, rhonchi, wheezes, rales.  Abdominal: Soft. BS +, no distension, tenderness, rebound or guarding.  Musculoskeletal: Normal range of motion. No edema and no tenderness.  Neuro: Alert. Muscle tone coordination. No cranial nerve deficit. Skin: Skin is warm and dry. No rash noted. Not diaphoretic. No erythema. No pallor. Psychiatric: Normal mood and affect. Behavior, judgment, thought content normal.  Lab Results  Component Value Date   WBC 11.2 (H) 10/05/2017   HGB 15.0 10/05/2017   HCT 43.2 10/05/2017   MCV 82 10/05/2017   PLT 156 10/05/2017   Lab Results  Component Value Date   CREATININE 0.82 10/05/2017   BUN 16 10/05/2017   NA 138 10/05/2017   K 3.9 10/05/2017   CL 100 10/05/2017   CO2 22 10/05/2017    Lab Results  Component Value  Date   HGBA1C 5 4 11/21/2015    Lipid Panel     Component Value Date/Time   CHOL 212 (H) 02/01/2018 1640   TRIG 309 (H) 02/01/2018 1640   HDL 42 02/01/2018 1640   CHOLHDL 5.0 02/01/2018 1640   CHOLHDL 4.1 10/31/2013 1633   VLDL 34 10/31/2013 1633   LDLCALC 108 (H) 02/01/2018 1640        Assessment and plan:  1. Gastroesophageal reflux disease, esophagitis presence not specified, chronic, stable  Continue  pantoprazole (PROTONIX) 40 MG tablet; Take 1 tablet (40 mg total) by mouth daily.  Dispense: 90 tablet; Refill: 2  2. Chronic Back Pain, will trial a course of prednisone taper. Once patient receives insurance he would benefit from neurospine work-up and possible physical therapy.  Take Prednisone 20 mg,  in mornings with breakfast as follows:  Take 3 pills for 3 days, Take 2 pills for 3 days, and Take 1 pill  for 3 days.  Complete all medication.  End of visit requested a refill of levothyroxine. Refilled     Meds ordered this encounter  Medications  . pantoprazole (PROTONIX) 40 MG tablet    Sig: Take 1 tablet (40 mg total) by mouth daily.    Dispense:  90 tablet    Refill:  2  . levothyroxine (SYNTHROID, LEVOTHROID) 175 MCG tablet    Sig: Take 1 tablet (175 mcg total) by mouth daily.    Dispense:  30 tablet    Refill:  6  . predniSONE (DELTASONE) 20 MG tablet    Sig: Take 20 mg tablet with breakfast as follows:Take 3 pills for 3 days, take 2 pills for 3 days, and take 1 pill for 3 days.    Dispense:  18 tablet    Refill:  0     Return in about 3 months (around 09/22/2018), or Blood pressure follow-up.  The patient was given clear instructions to go to ER or return to medical center if symptoms don't improve, worsen or new problems develop. The patient verbalized understanding. The patient was told to call to get lab results if they haven't heard anything in the next week.     Carroll Sage. Kenton Kingfisher, MSN, [facility] and [place],  [place], [place] [address] [phone]       This note has been created with Dragon speech recognition software and smart phrase technology. Any transcriptional errors are unintentional.

## 2018-06-24 MED FILL — ?LEVOTHYROXINE 175 MCG TAB: 175 | 30 days supply | Qty: 30 | Fill #0

## 2018-06-24 MED FILL — predniSONE 20 MG TABS: 20 | 9 days supply | Qty: 18 | Fill #0

## 2018-07-19 ENCOUNTER — Ambulatory Visit: Payer: Self-pay | Admitting: Internal Medicine

## 2018-07-20 ENCOUNTER — Other Ambulatory Visit: Payer: Self-pay | Admitting: Family Medicine

## 2018-07-20 DIAGNOSIS — I10 Essential (primary) hypertension: Secondary | ICD-10-CM

## 2018-07-20 MED FILL — DICLOFENAC SODIUM 1% GEL: 1 | 12 days supply | Qty: 100 | Fill #0

## 2018-07-21 MED FILL — LOSARTAN POTASSIUM 25 MG TA: 25 | 30 days supply | Qty: 30 | Fill #0

## 2018-07-22 MED FILL — ?PANTOPRAZOLE SO DR 40MG TA: 40 | 30 days supply | Qty: 30 | Fill #1

## 2018-07-22 MED FILL — ?LEVOTHYROXINE 175 MCG TAB: 175 | 30 days supply | Qty: 30 | Fill #1

## 2018-08-04 ENCOUNTER — Telehealth: Payer: Self-pay | Admitting: Internal Medicine

## 2018-08-04 NOTE — Telephone Encounter (Signed)
LVM to Pt informed him that the appt he has on 08/05/18 is too early for decertified he need to call on 11/18 to schedule an appt with financial after the 11/23

## 2018-08-05 ENCOUNTER — Ambulatory Visit: Payer: Self-pay

## 2018-08-19 ENCOUNTER — Ambulatory Visit: Payer: Self-pay | Attending: Internal Medicine

## 2018-08-19 MED FILL — LOSARTAN POTASSIUM 25 MG TA: 25 | 30 days supply | Qty: 30 | Fill #1

## 2018-08-19 MED FILL — ?LEVOTHYROXINE 175 MCG TAB: 175 | 30 days supply | Qty: 30 | Fill #2

## 2018-08-19 MED FILL — DICLOFENAC SODIUM 1% GEL: 1 | 12 days supply | Qty: 100 | Fill #1

## 2018-08-19 MED FILL — ?PANTOPRAZOLE SO DR 40MG TA: 40 | 30 days supply | Qty: 30 | Fill #2

## 2018-09-02 ENCOUNTER — Telehealth: Payer: Self-pay | Admitting: Internal Medicine

## 2018-09-02 NOTE — Telephone Encounter (Signed)
Spoke with Pt about the direct deposit he get from his job, Pt claim that is not sure if is a pre-paid card since he does not remember sing for an account, he will stop by the office to show me the card to determinate if it is a pre-paid or not

## 2018-09-16 ENCOUNTER — Ambulatory Visit: Payer: Self-pay | Admitting: Internal Medicine

## 2018-09-27 MED FILL — LOSARTAN POTASSIUM 25 MG TA: 25 | 30 days supply | Qty: 30 | Fill #2

## 2018-09-27 MED FILL — ?PANTOPRAZOLE SO DR 40MG TA: 40 | 30 days supply | Qty: 30 | Fill #3

## 2018-09-27 MED FILL — DICLOFENAC SODIUM 1% GEL: 1 | 12 days supply | Qty: 100 | Fill #2

## 2018-09-27 MED FILL — ?LEVOTHYROXINE 175 MCG TAB: 175 | 30 days supply | Qty: 30 | Fill #3

## 2018-09-29 ENCOUNTER — Ambulatory Visit: Payer: Self-pay | Attending: Internal Medicine | Admitting: Internal Medicine

## 2018-09-29 ENCOUNTER — Encounter: Payer: Self-pay | Admitting: Internal Medicine

## 2018-09-29 VITALS — BP 140/95 | HR 70 | Temp 98.2°F | Resp 16 | Wt 183.6 lb

## 2018-09-29 DIAGNOSIS — E669 Obesity, unspecified: Secondary | ICD-10-CM | POA: Insufficient documentation

## 2018-09-29 DIAGNOSIS — E782 Mixed hyperlipidemia: Secondary | ICD-10-CM | POA: Insufficient documentation

## 2018-09-29 DIAGNOSIS — K0510 Chronic gingivitis, plaque induced: Secondary | ICD-10-CM | POA: Insufficient documentation

## 2018-09-29 DIAGNOSIS — M5136 Other intervertebral disc degeneration, lumbar region: Secondary | ICD-10-CM | POA: Insufficient documentation

## 2018-09-29 DIAGNOSIS — G8929 Other chronic pain: Secondary | ICD-10-CM | POA: Insufficient documentation

## 2018-09-29 DIAGNOSIS — Z79899 Other long term (current) drug therapy: Secondary | ICD-10-CM | POA: Insufficient documentation

## 2018-09-29 DIAGNOSIS — Z87891 Personal history of nicotine dependence: Secondary | ICD-10-CM | POA: Insufficient documentation

## 2018-09-29 DIAGNOSIS — I10 Essential (primary) hypertension: Secondary | ICD-10-CM | POA: Insufficient documentation

## 2018-09-29 DIAGNOSIS — Z8249 Family history of ischemic heart disease and other diseases of the circulatory system: Secondary | ICD-10-CM | POA: Insufficient documentation

## 2018-09-29 DIAGNOSIS — H903 Sensorineural hearing loss, bilateral: Secondary | ICD-10-CM | POA: Insufficient documentation

## 2018-09-29 DIAGNOSIS — M47816 Spondylosis without myelopathy or radiculopathy, lumbar region: Secondary | ICD-10-CM | POA: Insufficient documentation

## 2018-09-29 DIAGNOSIS — J449 Chronic obstructive pulmonary disease, unspecified: Secondary | ICD-10-CM | POA: Insufficient documentation

## 2018-09-29 DIAGNOSIS — E039 Hypothyroidism, unspecified: Secondary | ICD-10-CM | POA: Insufficient documentation

## 2018-09-29 DIAGNOSIS — M545 Low back pain, unspecified: Secondary | ICD-10-CM

## 2018-09-29 DIAGNOSIS — Z6839 Body mass index (BMI) 39.0-39.9, adult: Secondary | ICD-10-CM | POA: Insufficient documentation

## 2018-09-29 DIAGNOSIS — K219 Gastro-esophageal reflux disease without esophagitis: Secondary | ICD-10-CM | POA: Insufficient documentation

## 2018-09-29 DIAGNOSIS — Z6832 Body mass index (BMI) 32.0-32.9, adult: Secondary | ICD-10-CM

## 2018-09-29 DIAGNOSIS — M25522 Pain in left elbow: Secondary | ICD-10-CM | POA: Insufficient documentation

## 2018-09-29 MED ORDER — DICLOFENAC SODIUM 1 % TD GEL
2.0000 g | Freq: Four times a day (QID) | TRANSDERMAL | 5 refills | Status: DC
Start: 2018-09-29 — End: 2019-07-27

## 2018-09-29 MED ORDER — LOSARTAN POTASSIUM 50 MG PO TABS: 50.0000 mg | ORAL_TABLET | Freq: Every day | ORAL | 6 refills | Status: DC

## 2018-09-29 MED ORDER — ATORVASTATIN CALCIUM 10 MG PO TABS: 10.0000 mg | ORAL_TABLET | Freq: Every day | ORAL | 4 refills | Status: DC

## 2018-09-29 MED ORDER — CYCLOBENZAPRINE HCL 5 MG PO TABS: 5.0000 mg | ORAL_TABLET | Freq: Every evening | ORAL | 1 refills | Status: DC | PRN

## 2018-09-29 NOTE — Patient Instructions (Addendum)
Your blood pressure is not controlled.  Please increase losartan to 50 mg daily.  Try to check your blood pressure at least once a week.  Goal is 130/80 or lower.  Please try to schedule an appointment with a dentist as soon as possible.  Please schedule an lab visit for Friday

## 2018-09-29 NOTE — Progress Notes (Signed)
Patient ID: Darren [name], male    DOB: 19-Nov-1960  MRN: [id number]  CC: Hypertension   Subjective: Darren [name] is a 58 y.o. male who presents for chronic ds management His concerns today include:  Pt with hx of GERD, hypothyroid,HTN,HL, chronic LBP and Barrett's esophagus  Chronic lower back pain:  No worse or better. He continues to blame his lawyer for losing his disability case when he injured self while working at [employer] several yrs ago -Uses a pellet jack at work but still has to do some lifting and bending.  Has to bend to open boxes.  At max he lifts 40-50 lbs. -Doing well with Voltaren gel.  HTN:  Reports compliance with Cozaar.  Has device but not checking regularly Limits salt in foods.  Denies any chest pains or shortness of breath.  No lower extremity edema.  Thyroid: Reports compliance with levothyroxine.  Denies any feeling of being hot or cold all the time.  No diarrhea or constipation.  HL:  Never got Lipitor from pharmacy that was prescribed several months ago because he did not have the funds at the time. Patient Active Problem List   Diagnosis Date Noted  . Epistaxis 11/04/2017  . Class 1 obesity due to excess calories without serious comorbidity with body mass index (BMI) of 31.0 to 31.9 in adult 11/04/2017  . Bilateral wrist pain 04/22/2017  . Disorder of left sacroiliac joint 12/21/2016  . Spondylosis without myelopathy or radiculopathy, lumbar region 12/21/2016  . Lumbar degenerative disc disease 12/21/2016  . Abnormal CT scan, esophagus 08/24/2016  . COPD (chronic obstructive pulmonary disease) with emphysema ([place]) 07/31/2016  . HTN (hypertension) 07/31/2016  . Dilation of thoracic aorta ([place]) 07/31/2016  . Esophageal dysphagia 07/09/2016  . Chronic midline low back pain 07/09/2016  . Sensorineural hearing loss (SNHL), bilateral 03/30/2016  . Vitamin D deficiency 04/06/2014  . Hypothyroidism 10/31/2013  . GERD (gastroesophageal reflux  disease) 11/25/2011  . Barrett esophagus 09/29/1999     Current Outpatient Medications on File Prior to Visit  Medication Sig Dispense Refill  . levothyroxine (SYNTHROID, LEVOTHROID) 175 MCG tablet Take 1 tablet (175 mcg total) by mouth daily. 30 tablet 6  . pantoprazole (PROTONIX) 40 MG tablet Take 1 tablet (40 mg total) by mouth daily. 90 tablet 2   No current facility-administered medications on file prior to visit.     No Known Allergies  Social History   Socioeconomic History  . Marital status: Single    Spouse name: Not on file  . Number of children: 2  . Years of education: Not on file  . Highest education level: Not on file  Occupational History  . Not on file  Social Needs  . Financial resource strain: Not on file  . Food insecurity:    Worry: Not on file    Inability: Not on file  . Transportation needs:    Medical: Not on file    Non-medical: Not on file  Tobacco Use  . Smoking status: Former Smoker    Packs/day: 1.00    Years: 26.00    Pack years: 26.00    Types: Cigarettes    Last attempt to quit: 09/15/2004    Years since quitting: 14.0  . Smokeless tobacco: Never Used  . Tobacco comment: Quit in 2005  Substance and Sexual Activity  . Alcohol use: Yes    Comment: ON WEEKENDS  . Drug use: No  . Sexual activity: Never    Birth  control/protection: None  Lifestyle  . Physical activity:    Days per week: Not on file    Minutes per session: Not on file  . Stress: Not on file  Relationships  . Social connections:    Talks on phone: Not on file    Gets together: Not on file    Attends religious service: Not on file    Active member of club or organization: Not on file    Attends meetings of clubs or organizations: Not on file    Relationship status: Not on file  . Intimate partner violence:    Fear of current or ex partner: Not on file    Emotionally abused: Not on file    Physically abused: Not on file    Forced sexual activity: Not on file    Other Topics Concern  . Not on file  Social History Narrative   Lives alone.      Family History  Problem Relation Age of Onset  . Cancer Father        lung  . Stroke Father   . Heart attack Father 14  . Lung cancer Mother   . Thyroid disease Sister   . Colon cancer Neg Hx   . Esophageal cancer Neg Hx     Past Surgical History:  Procedure Laterality Date  . BIOPSY  09/18/2016   Procedure: BIOPSY;  Surgeon: Daneil Dolin, MD;  [facility];  Service: Endoscopy;;  biopsy of distal esophagus  . ESOPHAGOGASTRODUODENOSCOPY  2001   barrett's per path  . ESOPHAGOGASTRODUODENOSCOPY (EGD) WITH PROPOFOL N/A 09/18/2016   Procedure: ESOPHAGOGASTRODUODENOSCOPY (EGD) WITH PROPOFOL;  Surgeon: Daneil Dolin, MD;  Location: [facility];  Service: Endoscopy;  Laterality: N/A;  1200  . [place]  . MULTIPLE EXTRACTIONS WITH ALVEOLOPLASTY  11/30/2011   Procedure: MULTIPLE EXTRACION WITH ALVEOLOPLASTY;  Surgeon: Gae Bon, DDS;  Location: [facility];  Service: Oral Surgery;  Laterality: Bilateral;    ROS: Review of Systems  HENT:       Complains of gum pain in the lower jaw anteriorly and on the LT side.  Needs to see dentist but can not afford at this time and does not have OC  Musculoskeletal:       Some soreness in LT antecubital fossa medially.  Started on Thanksgiving day when he was playing around wrestling with 1 of his nephews.  Denies any swelling.  Hurts when he extends the forearm.    PHYSICAL EXAM: BP (!) 140/95   Pulse 70   Temp 98.2 F (36.8 C) (Oral)   Resp 16   Wt 183 lb 9.6 oz (83.3 kg)   SpO2 93%   BMI 32.52 kg/m   Wt Readings from Last 3 Encounters:  09/29/18 183 lb 9.6 oz (83.3 kg)  06/23/18 173 lb 3.2 oz (78.6 kg)  03/25/18 172 lb (78 kg)    Physical Exam  General appearance - alert, well appearing, and in no distress Mental status - normal mood, behavior, speech, dress, motor activity, and thought processes Mouth -poor  dental hygiene with significant plaque buildup around the teeth.  No signs of dental abscess noted at this time Chest - clear to auscultation, no wheezes, rales or rhonchi, symmetric air entry Heart - normal rate, regular rhythm, normal S1, S2, no murmurs, rubs, clicks or gallops Extremities -no lower extremity edema. MSK: No tenderness on palpation of lumbar spine or surrounding paraspinal muscles.  Gait  is stable. Left elbow: No edema or erythema.  Mild tenderness on palpation of the upper forearm muscles medially.  Grip is good bilaterally   ASSESSMENT AND PLAN: 1. Essential hypertension Not at goal.  Increase Cozaar to 50 mg daily.  DASH diet discussed and encouraged. - CBC; Future - Comprehensive metabolic panel; Future - losartan (COZAAR) 50 MG tablet; Take 1 tablet (50 mg total) by mouth daily.  Dispense: 30 tablet; Refill: 6 - Comprehensive metabolic panel - CBC  2. Chronic bilateral low back pain without sciatica Continue Voltaren gel.  I have given some Flexeril to use at bedtime as needed.  Patient warned that medication can cause drowsiness.  Weight loss advised.  He is gained 10 pounds since last visit but about 2 pounds of this is due to current layering of clothing that he has on today. - diclofenac sodium (VOLTAREN) 1 % GEL; Apply 2 g topically 4 (four) times daily.  Dispense: 100 g; Refill: 5 - cyclobenzaprine (FLEXERIL) 5 MG tablet; Take 1 tablet (5 mg total) by mouth at bedtime as needed for muscle spasms.  Dispense: 30 tablet; Refill: 1  3. Mixed hyperlipidemia Patient agreeable to me sending prescription for Lipitor to the pharmacy and he will pick it up when he can afford. - Lipid panel; Future - atorvastatin (LIPITOR) 10 MG tablet; Take 1 tablet (10 mg total) by mouth daily.  Dispense: 30 tablet; Refill: 4 - Lipid panel  4. Gingivitis Advised trying to see a dentist as soon as possible.  There are some providers in the area that can provide services at low  cost  5. Acquired hypothyroidism - TSH; Future   6. Left elbow pain - diclofenac sodium (VOLTAREN) 1 % GEL; Apply 2 g topically 4 (four) times daily.  Dispense: 100 g; Refill: 5  7. Obesity (BMI 30-39.9) Encourage weight loss through healthy eating and regular exercise.  Advised patient to cut out sugary drinks.  Cut back on white carbohydrates and eat more white meat than red meat.  Encourage some form of regular exercise at least 3 to 4 days a week starting at 15 minutes  Patient was given the opportunity to ask questions.  Patient verbalized understanding of the plan and was able to repeat key elements of the plan.   Orders Placed This Encounter  Procedures  . CBC  . Comprehensive metabolic panel  . TSH  . Lipid panel     Requested Prescriptions   Signed Prescriptions Disp Refills  . diclofenac sodium (VOLTAREN) 1 % GEL 100 g 5    Sig: Apply 2 g topically 4 (four) times daily.  . cyclobenzaprine (FLEXERIL) 5 MG tablet 30 tablet 1    Sig: Take 1 tablet (5 mg total) by mouth at bedtime as needed for muscle spasms.  Marland Kitchen losartan (COZAAR) 50 MG tablet 30 tablet 6    Sig: Take 1 tablet (50 mg total) by mouth daily.  Marland Kitchen atorvastatin (LIPITOR) 10 MG tablet 30 tablet 4    Sig: Take 1 tablet (10 mg total) by mouth daily.    Return in about 3 months (around 12/29/2018).  Karle Plumber, MD, FACP

## 2018-09-30 ENCOUNTER — Other Ambulatory Visit: Payer: Self-pay

## 2018-09-30 ENCOUNTER — Telehealth: Payer: Self-pay | Admitting: Internal Medicine

## 2018-09-30 NOTE — Telephone Encounter (Signed)
Pt called upset of the news that his CAFA was denied since he submit the information late, according to the note, he had an appt with the financial on 10/68/16 and the application was review on 09/02/18 as you can see the note, now he claim that he could did not come to bring the bank card  ( helping sing up in the webs to get the information this was done on 09/29/2018) he came to see me on 09/29/2018 with the bank card, now his asking to applied the decision since he did not have transportation and the [place]   We was close, if we can do anything for him, I inform him that I will talk to my office manager on 10/03/2018 to see if we can do anything but that I can not promise that will be able to applied  The decision

## 2018-09-30 NOTE — Telephone Encounter (Signed)
I LVM to Pt informed him that the [facility] program has been Denied since he took to long to bring the information we need to complete the application and can not re-apply until 02/20/2019

## 2018-10-06 ENCOUNTER — Telehealth: Payer: Self-pay | Admitting: Internal Medicine

## 2018-10-06 NOTE — Telephone Encounter (Signed)
I called Pt to inform that we got him approve for CAFA only for 75%, LVM

## 2018-10-17 ENCOUNTER — Ambulatory Visit: Payer: Self-pay | Attending: Internal Medicine

## 2018-10-17 DIAGNOSIS — E782 Mixed hyperlipidemia: Secondary | ICD-10-CM | POA: Insufficient documentation

## 2018-10-17 DIAGNOSIS — E039 Hypothyroidism, unspecified: Secondary | ICD-10-CM | POA: Insufficient documentation

## 2018-10-17 DIAGNOSIS — I10 Essential (primary) hypertension: Secondary | ICD-10-CM | POA: Insufficient documentation

## 2018-10-18 ENCOUNTER — Telehealth: Payer: Self-pay

## 2018-10-18 LAB — COMPREHENSIVE METABOLIC PANEL
ALBUMIN: 4.5 g/dL (ref 3.8–4.9)
ALT: 48 IU/L — ABNORMAL HIGH (ref 0–44)
AST: 28 IU/L (ref 0–40)
Albumin/Globulin Ratio: 2 (ref 1.2–2.2)
Alkaline Phosphatase: 82 IU/L (ref 39–117)
BUN/Creatinine Ratio: 16 (ref 9–20)
BUN: 14 mg/dL (ref 6–24)
Bilirubin Total: 0.4 mg/dL (ref 0.0–1.2)
CO2: 23 mmol/L (ref 20–29)
Calcium: 9.5 mg/dL (ref 8.7–10.2)
Chloride: 100 mmol/L (ref 96–106)
Creatinine, Ser: 0.9 mg/dL (ref 0.76–1.27)
GFR calc Af Amer: 109 mL/min/{1.73_m2} (ref >59)
GFR, EST NON AFRICAN AMERICAN: 94 mL/min/{1.73_m2} (ref >59)
Globulin, Total: 2.2 g/dL (ref 1.5–4.5)
Glucose: 110 mg/dL — ABNORMAL HIGH (ref 65–99)
Potassium: 4.9 mmol/L (ref 3.5–5.2)
Sodium: 139 mmol/L (ref 134–144)
Total Protein: 6.7 g/dL (ref 6.0–8.5)

## 2018-10-18 LAB — CBC
Hematocrit: 43.8 % (ref 37.5–51.0)
Hemoglobin: 14.8 g/dL (ref 13.0–17.7)
MCH: 27.6 pg (ref 26.6–33.0)
MCHC: 33.8 g/dL (ref 31.5–35.7)
MCV: 82 fL (ref 79–97)
Platelets: 191 10*3/uL (ref 150–450)
RBC: 5.36 x10E6/uL (ref 4.14–5.80)
RDW: 13.1 % (ref 11.6–15.4)
WBC: 10.7 10*3/uL (ref 3.4–10.8)

## 2018-10-18 LAB — LIPID PANEL
CHOLESTEROL TOTAL: 224 mg/dL — AB (ref 100–199)
Chol/HDL Ratio: 5 ratio (ref 0.0–5.0)
HDL: 45 mg/dL (ref >39)
LDL Calculated: 125 mg/dL — ABNORMAL HIGH (ref 0–99)
Triglycerides: 268 mg/dL — ABNORMAL HIGH (ref 0–149)
VLDL Cholesterol Cal: 54 mg/dL — ABNORMAL HIGH (ref 5–40)

## 2018-10-18 LAB — TSH: TSH: 1.97 u[IU]/mL (ref 0.450–4.500)

## 2018-10-18 NOTE — Telephone Encounter (Signed)
Contacted pt to go over lab results pt didn't answer lvm asking pt to give me a call at his earliest convenience  

## 2018-10-25 ENCOUNTER — Telehealth: Payer: Self-pay | Admitting: Internal Medicine

## 2018-10-25 MED FILL — ?PANTOPRAZOLE SO DR 40MG TA: 40 | 30 days supply | Qty: 30 | Fill #4

## 2018-10-25 MED FILL — DICLOFENAC SODIUM 1% GEL: 1 | 12 days supply | Qty: 100 | Fill #3

## 2018-10-25 MED FILL — ?ATORVASTATIN 10 MG TABLET: 10 | 30 days supply | Qty: 30 | Fill #0

## 2018-10-25 MED FILL — LOSARTAN POTASSIUM 50 MG TA: 50 | 30 days supply | Qty: 30 | Fill #0

## 2018-10-25 MED FILL — ?LEVOTHYROXINE 175 MCG TAB: 175 | 30 days supply | Qty: 30 | Fill #4

## 2018-10-25 NOTE — Telephone Encounter (Signed)
Please call patient back with lab results as soon as possible

## 2018-10-26 NOTE — Telephone Encounter (Signed)
Returned pt call to go over lab results (second attempt) pt didn't answer lvm asking pt to give me a call at his earliest convenience

## 2018-11-02 ENCOUNTER — Ambulatory Visit (HOSPITAL_COMMUNITY)
Admission: RE | Admit: 2018-11-02 | Discharge: 2018-11-02 | Disposition: A | Discharge status: Home / Self Care | Payer: Self-pay | Source: Ambulatory Visit | Attending: Physician Assistant | Admitting: Physician Assistant

## 2018-11-02 ENCOUNTER — Ambulatory Visit: Payer: Self-pay | Attending: Internal Medicine | Admitting: Physician Assistant

## 2018-11-02 VITALS — BP 157/93 | HR 65 | Temp 97.9°F | Resp 16 | Wt 177.0 lb

## 2018-11-02 DIAGNOSIS — I10 Essential (primary) hypertension: Secondary | ICD-10-CM

## 2018-11-02 DIAGNOSIS — M25512 Pain in left shoulder: Secondary | ICD-10-CM | POA: Insufficient documentation

## 2018-11-02 MED ORDER — NAPROXEN 500 MG PO TABS: 500.0000 mg | ORAL_TABLET | Freq: Two times a day (BID) | ORAL | 0 refills | Status: DC

## 2018-11-02 MED ORDER — METHOCARBAMOL 500 MG PO TABS: 500.0000 mg | ORAL_TABLET | Freq: Three times a day (TID) | ORAL | 0 refills | Status: DC | PRN

## 2018-11-02 NOTE — Progress Notes (Signed)
Patient ID: Darren [name], male   DOB: 23-May-1961, 58 y.o.   MRN: [id number]   Darren [name], is a 58 y.o. male  FYB:[id number]  NID:[id number]  DOB - Sep 17, 1961  Subjective:  Chief Complaint and HPI: Darren [name] is a 58 y.o. male here today for L shoulder pain since about the day after Thanksgiving and arm wrestling.  Some relief with Diclofenac.  No other injury.  Pain is worse when reaching out for something and at night.  No CP/SOB.  No numbness/weakness.    ROS:   Constitutional:  No f/c, No night sweats, No unexplained weight loss. EENT:  No vision changes, No blurry vision, No hearing changes. No mouth, throat, or ear problems.  Respiratory: No cough, No SOB Cardiac: No CP, no palpitations GI:  No abd pain, No N/V/D. GU: No Urinary s/sx Musculoskeletal: L shoulder pain Neuro: No headache, no dizziness, no motor weakness.  Skin: No rash Endocrine:  No polydipsia. No polyuria.  Psych: Denies SI/HI  No problems updated.  ALLERGIES: No Known Allergies  PAST MEDICAL HISTORY: Past Medical History:  Diagnosis Date  . Arthritis    per Medical clearance form.  . Ascending aortic aneurysm ([place]) 06/2016   4.2 cm  . Barrett esophagus 2001   path report in EPIC 2001 c/w Barrett's. endoscopy report unavailable.  Marland Kitchen COPD (chronic obstructive pulmonary disease) ([place])    per medical clearance form.  . Depression    per Medical Clearance form(09/18/11)  . GERD (gastroesophageal reflux disease)    per Medical clearance form.  . Hypertension    Per Medical Clearance form.  . Hypothyroidism    per Medical Clearance form.    MEDICATIONS AT HOME: Prior to Admission medications   Medication Sig Start Date End Date Taking? Authorizing Provider  atorvastatin (LIPITOR) 10 MG tablet Take 1 tablet (10 mg total) by mouth daily. 09/29/18   Ladell Pier, MD  diclofenac sodium (VOLTAREN) 1 % GEL Apply 2 g topically 4 (four) times daily. 09/29/18   Ladell Pier, MD  levothyroxine  (SYNTHROID, LEVOTHROID) 175 MCG tablet Take 1 tablet (175 mcg total) by mouth daily. 06/23/18   Scot Jun, FNP  losartan (COZAAR) 50 MG tablet Take 1 tablet (50 mg total) by mouth daily. 09/29/18   Ladell Pier, MD  methocarbamol (ROBAXIN) 500 MG tablet Take 1 tablet (500 mg total) by mouth every 8 (eight) hours as needed for muscle spasms. 11/02/18   Argentina Donovan, PA-C  naproxen (NAPROSYN) 500 MG tablet Take 1 tablet (500 mg total) by mouth 2 (two) times daily with a meal. Prn pain 11/02/18   Freeman Caldron M, PA-C  pantoprazole (PROTONIX) 40 MG tablet Take 1 tablet (40 mg total) by mouth daily. 06/23/18   Scot Jun, FNP     Objective:  EXAM:   Vitals:   11/02/18 1616  BP: (!) 157/93  Pulse: 65  Resp: 16  Temp: 97.9 F (36.6 C)  TempSrc: Oral  SpO2: 95%  Weight: 177 lb (80.3 kg)    General appearance : A&OX3. NAD. Non-toxic-appearing HEENT: Atraumatic and Normocephalic.  PERRLA. EOM intact.  Neck: supple, no JVD. No cervical lymphadenopathy. No thyromegaly Chest/Lungs:  Breathing-non-labored, Good air entry bilaterally, breath sounds normal without rales, rhonchi, or wheezing  CVS: S1 S2 regular, no murmurs, gallops, rubs  ROM ~80% of normal.  Neg empty can/speed's test.  +TTP over shoulder and spasm in trapezius on L Neurology:  CN II-XII grossly intact, Non focal.  Psych:  TP linear. J/I WNL. Normal speech. Appropriate eye contact and affect.  Skin:  No Rash  Data Review Lab Results  Component Value Date   HGBA1C 5 4 11/21/2015     Assessment & Plan   1. Acute pain of left shoulder after arm wrestling 2 months ago - DG Shoulder Left; Future - naproxen (NAPROSYN) 500 MG tablet; Take 1 tablet (500 mg total) by mouth 2 (two) times daily with a meal. Prn pain  Dispense: 30 tablet; Refill: 0 - methocarbamol (ROBAXIN) 500 MG tablet; Take 1 tablet (500 mg total) by mouth every 8 (eight) hours as needed for muscle spasms.  Dispense: 90 tablet; Refill:  0 - Ambulatory referral to Orthopedic Surgery  2. Essential hypertension Uncontrolled.  Didn't take meds today.  Take meds.  Check BP OOO and record and bring to next visit.     Patient have been counseled extensively about nutrition and exercise  Return in about 3 months (around 01/31/2019) for Dr Everardo All and bloodwork.  The patient was given clear instructions to go to ER or return to medical center if symptoms don't improve, worsen or new problems develop. The patient verbalized understanding. The patient was told to call to get lab results if they haven't heard anything in the next week.     Freeman Caldron, PA-C [facility], [place]   11/02/2018, 4:26 PM

## 2018-11-03 ENCOUNTER — Telehealth: Payer: Self-pay

## 2018-11-03 NOTE — Telephone Encounter (Signed)
Contacted pt to go over xray results pt is aware and doesn't have any questions or concerns  

## 2018-11-10 MED FILL — NAPROXEN 500 MG TABLET: 500 | 15 days supply | Qty: 30 | Fill #0

## 2018-11-16 ENCOUNTER — Ambulatory Visit (INDEPENDENT_AMBULATORY_CARE_PROVIDER_SITE_OTHER): Payer: Self-pay | Admitting: Orthopedic Surgery

## 2018-11-28 ENCOUNTER — Ambulatory Visit: Payer: Self-pay | Admitting: Internal Medicine

## 2018-11-29 ENCOUNTER — Other Ambulatory Visit: Payer: Self-pay | Admitting: Physician Assistant

## 2018-11-29 DIAGNOSIS — M25512 Pain in left shoulder: Secondary | ICD-10-CM

## 2018-11-29 MED FILL — LOSARTAN POTASSIUM 50 MG TA: 50 | 30 days supply | Qty: 30 | Fill #1

## 2018-11-29 MED FILL — ?PANTOPRAZOLE SOD DR 40MG T: 40 | 30 days supply | Qty: 30 | Fill #5

## 2018-11-29 MED FILL — ?ATORVASTATIN 10 MG TABLET: 10 | 30 days supply | Qty: 30 | Fill #1

## 2018-11-29 MED FILL — DICLOFENAC SODIUM 1% GEL: 1 | 12 days supply | Qty: 100 | Fill #4

## 2018-11-29 MED FILL — LEVOTHYROXINE 175 MCG TAB: 175 | 30 days supply | Qty: 30 | Fill #5

## 2018-11-30 MED FILL — NAPROXEN 500 MG TABLET: 500 | 15 days supply | Qty: 30 | Fill #0

## 2018-12-29 MED FILL — ?PANTOPRAZOLE SOD DR 40MGTA: 40 | 90 days supply | Qty: 90 | Fill #6

## 2018-12-29 MED FILL — ?LEVOTHYROXINE 175 MCG TAB: 175 | 30 days supply | Qty: 30 | Fill #6

## 2018-12-29 MED FILL — ?ATORVASTATIN 10 MG TABLET: 10 | 90 days supply | Qty: 90 | Fill #2

## 2019-01-07 MED FILL — LOSARTAN POTASSIUM 50 MG TA: 50 | 30 days supply | Qty: 30 | Fill #2

## 2019-01-07 MED FILL — DICLOFENAC SODIUM 1% GEL: 1 | 12 days supply | Qty: 100 | Fill #5

## 2019-01-10 ENCOUNTER — Telehealth: Payer: Self-pay | Admitting: Internal Medicine

## 2019-01-10 NOTE — Telephone Encounter (Signed)
New Message   Pt states he is concerned that his Losartan will give him Corona virus and he wants to make sure its ok to take. Please f/u

## 2019-01-11 NOTE — Telephone Encounter (Signed)
Will forward to clinical pharmacist

## 2019-01-12 ENCOUNTER — Telehealth: Payer: Self-pay | Admitting: Internal Medicine

## 2019-01-12 NOTE — Telephone Encounter (Signed)
Patient states they are experiencing nose bleeds as before states he would like to speak to someone in regards to whats going on. Was advised to go to the UC or ED. Please follow up.

## 2019-01-12 NOTE — Telephone Encounter (Signed)
Patient called stating that he can only answer the phone after 4:30 and is upset and concerned that he needs to speak with someone about what is going on. Please follow up.

## 2019-01-12 NOTE — Telephone Encounter (Signed)
Call placed, no answer. Left HIPAA-compliant VM instructing patient to return my call.

## 2019-01-13 NOTE — Telephone Encounter (Signed)
Attempted to reconnect with patient at 4:30 and 4:45 yesterday afternoon with no answer. Will attempt today as well.

## 2019-01-13 NOTE — Telephone Encounter (Signed)
Nurse called the patient's home phone number but received no answer and message was left on the voicemail for the patient to call back.  Return phone number given. 

## 2019-01-13 NOTE — Telephone Encounter (Signed)
Patients call taken.  Patient identified by name and date of birth.  Patient concerned that losartan would allow more susceptibility to COVID.  Patient assured that Nurse had not heard any information to that effect.  So patient was not taking medication and was having nose bleeds.  Patient restarted losartan three days ago and nose bleeds stopped.  Patient advised to not stop medication and call if nose bleeds returned.  Patient advised that if symptoms did not improve then they should go to the Emergency Department or Urgent Care.  Patient acknowledged understanding of advise.

## 2019-01-26 ENCOUNTER — Other Ambulatory Visit: Payer: Self-pay | Admitting: Family Medicine

## 2019-01-26 DIAGNOSIS — E039 Hypothyroidism, unspecified: Secondary | ICD-10-CM

## 2019-01-27 ENCOUNTER — Other Ambulatory Visit: Payer: Self-pay | Admitting: Family Medicine

## 2019-01-27 DIAGNOSIS — E039 Hypothyroidism, unspecified: Secondary | ICD-10-CM

## 2019-01-27 MED FILL — ?LEVOTHYROXINE 175 MCG TAB: 175 | 90 days supply | Qty: 90 | Fill #3

## 2019-01-27 NOTE — Telephone Encounter (Signed)
  Requested Prescriptions  Pending Prescriptions Disp Refills   levothyroxine (SYNTHROID) 175 MCG tablet [Pharmacy Med Name: LEVOTHYROXINE 175 MCG TAB 175 TAB] 30 tablet 6    Sig: Take 1 tablet (175 mcg total) by mouth daily.     There is no refill protocol information for this order

## 2019-02-03 MED FILL — LOSARTAN POTASSIUM 50 MG TA: 50 | 30 days supply | Qty: 30 | Fill #3

## 2019-03-07 MED FILL — LOSARTAN POTASSIUM 50 MG TA: 50 | 30 days supply | Qty: 30 | Fill #4

## 2019-03-09 MED FILL — DICLOFENAC SODIUM 1% GEL: 1 | 12 days supply | Qty: 100 | Fill #0

## 2019-03-16 ENCOUNTER — Telehealth: Payer: Self-pay | Admitting: Internal Medicine

## 2019-03-16 NOTE — Telephone Encounter (Signed)
Patients call taken.  Patient identified by name and date of birth.  Patient exposed to two positive cases of COVID-19 at work.  Patient has a COVID-19 risk number of 4.  Patient would like to get tested and needs PCP to send a PEC letter so an appointment could be made.  Patient acknowledged understanding of advice.

## 2019-03-16 NOTE — Telephone Encounter (Signed)
Patient called stating he needs orders sent to [facility] to get COVID testing. Please follow up. Patient states two coworkers tested positive and doesn't have symptoms but was told to get tested.

## 2019-03-17 ENCOUNTER — Telehealth: Payer: Self-pay | Admitting: *Deleted

## 2019-03-17 ENCOUNTER — Telehealth: Payer: Self-pay | Admitting: General Practice

## 2019-03-17 ENCOUNTER — Telehealth: Payer: Self-pay

## 2019-03-17 ENCOUNTER — Other Ambulatory Visit: Payer: Self-pay

## 2019-03-17 DIAGNOSIS — Z20822 Contact with and (suspected) exposure to covid-19: Secondary | ICD-10-CM

## 2019-03-17 NOTE — Telephone Encounter (Signed)
-----   Message from Ladell Pier, MD sent at 03/16/2019  8:48 PM EDT ----- I am the PCP for this pt.  He called the office this afternoon to report that he was exposed to 2 positive COVID cases at work and was told to be tested.  I will have a tele-visit with pt tomorrow but wanted to send this message tonight so that hopefully he can be tested tomorrow. Name:  Darren [name] DOB:  1961-02-18 Phone:  [phone]

## 2019-03-17 NOTE — Telephone Encounter (Signed)
Called pt at [facility], lvm to return call to schedule covid testing.

## 2019-03-17 NOTE — Telephone Encounter (Addendum)
Attempted to contact pt to schedule testing; left message on voicemail. ----- Message from Ladell Pier, MD sent at 03/16/2019  8:48 PM EDT ----- I am the PCP for this pt.  He called the office this afternoon to report that he was exposed to 2 positive COVID cases at work and was told to be tested.  I will have a tele-visit with pt tomorrow but wanted to send this message tonight so that hopefully he can be tested tomorrow. Name:  Darren [name] DOB:  Jun 22, 1961 Phone:  [phone]

## 2019-03-17 NOTE — Telephone Encounter (Signed)
Incoming call from Patient requesting appointment for Covid-19 testing. Patient scheduled for today @345pm  . Patient voices understanding.

## 2019-03-19 ENCOUNTER — Telehealth: Payer: Self-pay | Admitting: Internal Medicine

## 2019-03-19 NOTE — Telephone Encounter (Signed)
-----   Message from Torrie Mayers, RN sent at 03/17/2019  9:58 AM EDT ----- Please send another message once tele-visit is complete and we will call him to schedule an appt for testing  ----- Message ----- From: Ladell Pier, MD Sent: 03/16/2019   8:48 PM EDT To: [facility]  I am the PCP for this pt.  He called the office this afternoon to report that he was exposed to 2 positive COVID cases at work and was told to be tested.  I will have a tele-visit with pt tomorrow but wanted to send this message tonight so that hopefully he can be tested tomorrow. Name:  Darren [name] DOB:  1961/02/28 Phone:  [phone]

## 2019-03-20 ENCOUNTER — Other Ambulatory Visit: Payer: Self-pay

## 2019-03-20 ENCOUNTER — Telehealth: Payer: Self-pay | Admitting: Internal Medicine

## 2019-03-20 ENCOUNTER — Other Ambulatory Visit: Payer: Self-pay | Admitting: Internal Medicine

## 2019-03-20 DIAGNOSIS — Z20822 Contact with and (suspected) exposure to covid-19: Secondary | ICD-10-CM

## 2019-03-20 NOTE — Telephone Encounter (Signed)
Pt would like to be called to get an appt for cafa letter call pt at 4:30 03-27-19

## 2019-03-20 NOTE — Telephone Encounter (Signed)
Called the patient twice to get him schedule for covid test patient didn't answer. LVM

## 2019-03-21 ENCOUNTER — Other Ambulatory Visit: Payer: Self-pay

## 2019-03-21 ENCOUNTER — Encounter: Payer: Self-pay | Admitting: Internal Medicine

## 2019-03-21 ENCOUNTER — Ambulatory Visit: Payer: HRSA Program | Attending: Internal Medicine | Admitting: Internal Medicine

## 2019-03-21 DIAGNOSIS — I10 Essential (primary) hypertension: Secondary | ICD-10-CM | POA: Diagnosis not present

## 2019-03-21 DIAGNOSIS — Z20828 Contact with and (suspected) exposure to other viral communicable diseases: Secondary | ICD-10-CM | POA: Diagnosis not present

## 2019-03-21 DIAGNOSIS — Z20822 Contact with and (suspected) exposure to covid-19: Secondary | ICD-10-CM

## 2019-03-21 DIAGNOSIS — E039 Hypothyroidism, unspecified: Secondary | ICD-10-CM

## 2019-03-21 DIAGNOSIS — E782 Mixed hyperlipidemia: Secondary | ICD-10-CM | POA: Diagnosis not present

## 2019-03-21 MED ORDER — LOSARTAN POTASSIUM 50 MG PO TABS: 50.0000 mg | ORAL_TABLET | Freq: Every day | ORAL | 6 refills | Status: DC

## 2019-03-21 NOTE — Progress Notes (Signed)
Virtual Visit via Telephone Note Due to current restrictions/limitations of in-office visits due to the COVID-19 pandemic, this scheduled clinical appointment was converted to a telehealth visit  I connected with Darren [name] on 03/21/19 at 1:39 p.m by telephone and verified that I am speaking with the correct person using two identifiers. I am in my office.  The patient is at home.  Only the patient and myself participated in this encounter.  I discussed the limitations, risks, security and privacy concerns of performing an evaluation and management service by telephone and the availability of in person appointments. I also discussed with the patient that there may be a patient responsible charge related to this service. The patient expressed understanding and agreed to proceed.  History of Present Illness: Pt with hx of GERD, hypothyroid,HTN,HL, chronic LBP and Barrett's esophagus.  Patient last seen by me 09/2018.  Pt reports that 2 of his coworkers tested positive for [place] recently.  He was in direct contact with both of them. 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  -pt denies any fever or cough. Some SOB when he bends over to tie shoes but this is not new.  No loss of test or smell.  He had COVID test yesterday.  He was pulled out of service until results come back.  He is anxious to return to work.  HYPERTENSION Currently taking: see medication list Med Adherence: [x]  Yes    []  No Medication side effects: []  Yes    [x]  No Adherence with salt restriction: [x]  Yes    []  No Home Monitoring?: []  Yes    [x]  No Monitoring Frequency: []  Yes    []  No Home BP results range: []  Yes    []  No SOB? []  Yes    [x]  No Chest Pain?: []  Yes    [x]  No Leg swelling?: []  Yes    [x]  No Headaches?: []  Yes    [x]  No Dizziness? []  Yes    [x]  No Comments:   HL:  Tolerating Lipitor.  Hypothyroid:  Taking the levothyroxine.  No palpitations or diarrhea  Outpatient Encounter  Medications as of 03/21/2019  Medication Sig  . atorvastatin (LIPITOR) 10 MG tablet Take 1 tablet (10 mg total) by mouth daily.  . diclofenac sodium (VOLTAREN) 1 % GEL Apply 2 g topically 4 (four) times daily.  Marland Kitchen levothyroxine (SYNTHROID) 175 MCG tablet TAKE 1 TABLET (175 MCG TOTAL) BY MOUTH DAILY.  Marland Kitchen losartan (COZAAR) 50 MG tablet Take 1 tablet (50 mg total) by mouth daily.  . methocarbamol (ROBAXIN) 500 MG tablet Take 1 tablet (500 mg total) by mouth every 8 (eight) hours as needed for muscle spasms.  . naproxen (NAPROSYN) 500 MG tablet TAKE 1 TABLET (500 MG TOTAL) BY MOUTH 2 (TWO) TIMES DAILY WITH A MEAL AS NEEDED FOR PAIN.  Marland Kitchen pantoprazole (PROTONIX) 40 MG tablet Take 1 tablet (40 mg total) by mouth daily.  . [DISCONTINUED] losartan (COZAAR) 50 MG tablet Take 1 tablet (50 mg total) by mouth daily.   No facility-administered encounter medications on file as of 03/21/2019.       Observations/Objective:  Results for orders placed or performed in visit on 09/29/18  Lipid panel  Result Value Ref Range   Cholesterol, Total 224 (H) 100 - 199 mg/dL   Triglycerides 268 (H) 0 - 149 mg/dL   HDL 45 >39 mg/dL   VLDL Cholesterol Cal 54 (H) 5 - 40 mg/dL   LDL Calculated 125 (H) 0 -  99 mg/dL   Chol/HDL Ratio 5.0 0.0 - 5.0 ratio  TSH  Result Value Ref Range   TSH 1.970 0.450 - 4.500 uIU/mL  Comprehensive metabolic panel  Result Value Ref Range   Glucose 110 (H) 65 - 99 mg/dL   BUN 14 6 - 24 mg/dL   Creatinine, Ser 0.90 0.76 - 1.27 mg/dL   GFR calc non Af Amer 94 >59 mL/min/1.73   GFR calc Af Amer 109 >59 mL/min/1.73   BUN/Creatinine Ratio 16 9 - 20   Sodium 139 134 - 144 mmol/L   Potassium 4.9 3.5 - 5.2 mmol/L   Chloride 100 96 - 106 mmol/L   CO2 23 20 - 29 mmol/L   Calcium 9.5 8.7 - 10.2 mg/dL   Total Protein 6.7 6.0 - 8.5 g/dL   Albumin 4.5 3.8 - 4.9 g/dL   Globulin, Total 2.2 1.5 - 4.5 g/dL   Albumin/Globulin Ratio 2.0 1.2 - 2.2   Bilirubin Total 0.4 0.0 - 1.2 mg/dL   Alkaline  Phosphatase 82 39 - 117 IU/L   AST 28 0 - 40 IU/L   ALT 48 (H) 0 - 44 IU/L  CBC  Result Value Ref Range   WBC 10.7 3.4 - 10.8 x10E3/uL   RBC 5.36 4.14 - 5.80 x10E6/uL   Hemoglobin 14.8 13.0 - 17.7 g/dL   Hematocrit 43.8 37.5 - 51.0 %   MCV 82 79 - 97 fL   MCH 27.6 26.6 - 33.0 pg   MCHC 33.8 31.5 - 35.7 g/dL   RDW 13.1 11.6 - 15.4 %   Platelets 191 150 - 450 x10E3/uL     Assessment and Plan: 1. Close Exposure to Covid-19 Virus -Advised patient that we have to wait for the results of COVID-19 testing before releasing him to work.  We can release him only if the test is negative.  Encouraged him to remain indoors as much as possible until results are available.  If result is negative, he is encouraged to continue to practice social distancing, good handwashing and wearing of mass - 2. Essential hypertension Continue current medications and low-salt diet - losartan (COZAAR) 50 MG tablet; Take 1 tablet (50 mg total) by mouth daily.  Dispense: 90 tablet; Refill: 6  3. Mixed hyperlipidemia Continue atorvastatin  4. Acquired hypothyroidism Continue levothyroxine.  We will plan to check TSH on next office visit.  Follow Up Instructions: F/u in 3 mths  I discussed the assessment and treatment plan with the patient. The patient was provided an opportunity to ask questions and all were answered. The patient agreed with the plan and demonstrated an understanding of the instructions.   The patient was advised to call back or seek an in-person evaluation if the symptoms worsen or if the condition fails to improve as anticipated.  I provided 16 minutes of non-face-to-face time during this encounter.   Karle Plumber, MD

## 2019-03-21 NOTE — Progress Notes (Signed)
Patient verified DOB Patient has not taken medication today. Patient has not eaten today. Patient has no pain at this time. Patient is home awaiting the results.

## 2019-03-23 ENCOUNTER — Telehealth: Payer: Self-pay | Admitting: Internal Medicine

## 2019-03-23 LAB — NOVEL CORONAVIRUS, NAA: SARS-CoV-2, NAA: NOT DETECTED

## 2019-03-23 NOTE — Telephone Encounter (Signed)
Return work note written for patient. Patient notified he may pick up his letter. Pt verbalized understanding.

## 2019-03-23 NOTE — Telephone Encounter (Signed)
Pt got his results back for covid-19, he tested negative..he would like a letter stating that he tested negative, so he can return to work..pt stated to please LVM if there is no answer..please follow up

## 2019-03-24 ENCOUNTER — Encounter: Payer: Self-pay | Admitting: Internal Medicine

## 2019-04-03 MED FILL — LOSARTAN POTASSIUM 50 MG TA: 50 | 30 days supply | Qty: 30 | Fill #5

## 2019-04-03 MED FILL — ?PANTOPRAZOLE SO DR 40MG TA: 40 | 30 days supply | Qty: 30 | Fill #2

## 2019-04-05 ENCOUNTER — Other Ambulatory Visit: Payer: Self-pay | Admitting: Internal Medicine

## 2019-04-05 DIAGNOSIS — E782 Mixed hyperlipidemia: Secondary | ICD-10-CM

## 2019-04-05 MED FILL — ATORVASTATIN 10 MG TABLET: 10 | 90 days supply | Qty: 30 | Fill #0

## 2019-04-10 ENCOUNTER — Other Ambulatory Visit: Payer: Self-pay

## 2019-04-10 ENCOUNTER — Ambulatory Visit: Payer: Self-pay | Attending: Internal Medicine

## 2019-04-11 ENCOUNTER — Telehealth: Payer: Self-pay | Admitting: Internal Medicine

## 2019-04-11 MED FILL — DICLOFENAC SODIUM 1% GEL: 1 | 12 days supply | Qty: 100 | Fill #1

## 2019-04-11 NOTE — Telephone Encounter (Signed)
Pt would like to be called he states he's been having nose bleelds a couple times this week please follow up

## 2019-04-11 NOTE — Telephone Encounter (Signed)
Will forward to triage.

## 2019-04-11 NOTE — Telephone Encounter (Signed)
Nurse called the patient's home phone number but received no answer and message was left on the voicemail for the patient to call back.  Return phone number given. 

## 2019-04-12 NOTE — Telephone Encounter (Signed)
Patients call returned.  Patient identified by name and date of birth.  Patient states that he has been having intermittent nosebleeds for several weeks.    Patient advised to go to Urgent Care or the Emergency Department to be assessed.  Patient acknowledged understanding of advice.

## 2019-04-20 ENCOUNTER — Telehealth: Payer: Self-pay | Admitting: Internal Medicine

## 2019-04-20 NOTE — Telephone Encounter (Signed)
I called Pt to informed that we need a copy of the pre-paid card statement or transaction to please call me or come to the office as soon is possible

## 2019-05-01 ENCOUNTER — Other Ambulatory Visit: Payer: Self-pay | Admitting: Internal Medicine

## 2019-05-01 DIAGNOSIS — E782 Mixed hyperlipidemia: Secondary | ICD-10-CM

## 2019-05-01 DIAGNOSIS — K219 Gastro-esophageal reflux disease without esophagitis: Secondary | ICD-10-CM

## 2019-05-01 MED FILL — LEVOTHYROXINE 175 MCG TAB: 175 | 30 days supply | Qty: 30 | Fill #0

## 2019-05-01 MED FILL — LOSARTAN POTASSIUM 50 MG TA: 50 | 30 days supply | Qty: 30 | Fill #6

## 2019-05-02 MED FILL — ?PANTOPRAZOLE SO DR 40MG TA: 40 | 30 days supply | Qty: 30 | Fill #0

## 2019-05-02 MED FILL — ?ATORVASTATIN 10 MG TABLET: 10 | 30 days supply | Qty: 30 | Fill #0

## 2019-05-19 ENCOUNTER — Telehealth: Payer: Self-pay | Admitting: Internal Medicine

## 2019-05-19 NOTE — Telephone Encounter (Signed)
I called Pt back since he left me a VM, I inform him that I don't know who he spoke about for them to get the bank statement today that way they might change his CAFA denied, he need to call the person back and get the fax from them direct  That way they can handle direct the request please call the [phone],

## 2019-05-22 ENCOUNTER — Telehealth: Payer: Self-pay | Admitting: Internal Medicine

## 2019-05-22 NOTE — Telephone Encounter (Signed)
Pt was need a copy of the Bank statement, Pt could not get one or even a transaction print out, this was inform top the financial dept.

## 2019-05-22 NOTE — Telephone Encounter (Signed)
I called Pt and LVM with Unfortunately, It will have to remain denied. He can speak with the customer service team lead Harold Barban or customer service manager Shannon Nifong. Im sorry he is upset but I stand behind my decision.

## 2019-06-01 MED FILL — DICLOFENAC SODIUM 1% GEL: 1 | 12 days supply | Qty: 100 | Fill #2

## 2019-06-01 MED FILL — LEVOTHYROXINE 175 MCG TAB: 175 | 30 days supply | Qty: 30 | Fill #1

## 2019-06-02 MED FILL — LOSARTAN POTASSIUM 50 MG TA: 50 | 30 days supply | Qty: 30 | Fill #0

## 2019-06-02 MED FILL — ?PANTOPRAZOLE SODI DR 40MGT: 40 | 30 days supply | Qty: 30 | Fill #1

## 2019-06-07 MED FILL — ?ATORVASTATIN 10 MG TABLET: 10 | 30 days supply | Qty: 30 | Fill #1

## 2019-06-08 ENCOUNTER — Telehealth: Payer: Self-pay | Admitting: Internal Medicine

## 2019-06-08 NOTE — Telephone Encounter (Signed)
Pt was call and informed that he was approve for CAFA and I will make a copy of the letter an send it to him by mail

## 2019-06-08 NOTE — Telephone Encounter (Signed)
Patient would like to get an update on their cafa letter please follow up.

## 2019-07-27 ENCOUNTER — Encounter: Payer: Self-pay | Admitting: Family Medicine

## 2019-07-27 ENCOUNTER — Ambulatory Visit: Payer: Self-pay | Attending: Family Medicine | Admitting: Family Medicine

## 2019-07-27 ENCOUNTER — Other Ambulatory Visit: Payer: Self-pay

## 2019-07-27 DIAGNOSIS — M25512 Pain in left shoulder: Secondary | ICD-10-CM

## 2019-07-27 DIAGNOSIS — I10 Essential (primary) hypertension: Secondary | ICD-10-CM

## 2019-07-27 DIAGNOSIS — E039 Hypothyroidism, unspecified: Secondary | ICD-10-CM

## 2019-07-27 DIAGNOSIS — E782 Mixed hyperlipidemia: Secondary | ICD-10-CM

## 2019-07-27 DIAGNOSIS — K219 Gastro-esophageal reflux disease without esophagitis: Secondary | ICD-10-CM

## 2019-07-27 DIAGNOSIS — M545 Low back pain, unspecified: Secondary | ICD-10-CM

## 2019-07-27 DIAGNOSIS — M25522 Pain in left elbow: Secondary | ICD-10-CM

## 2019-07-27 DIAGNOSIS — G8929 Other chronic pain: Secondary | ICD-10-CM

## 2019-07-27 MED ORDER — LEVOTHYROXINE SODIUM 175 MCG PO TABS: 175.0000 ug | ORAL_TABLET | Freq: Every day | ORAL | 1 refills | Status: DC

## 2019-07-27 MED ORDER — DICLOFENAC SODIUM 1 % TD GEL: 4.0000 g | Freq: Four times a day (QID) | TRANSDERMAL | 5 refills | Status: DC

## 2019-07-27 MED ORDER — PANTOPRAZOLE SODIUM 40 MG PO TBEC: 40.0000 mg | DELAYED_RELEASE_TABLET | Freq: Every day | ORAL | 2 refills | Status: DC

## 2019-07-27 MED ORDER — LOSARTAN POTASSIUM 50 MG PO TABS: 50.0000 mg | ORAL_TABLET | Freq: Every day | ORAL | 1 refills | Status: DC

## 2019-07-27 MED ORDER — ATORVASTATIN CALCIUM 10 MG PO TABS: 10.0000 mg | ORAL_TABLET | Freq: Every day | ORAL | 1 refills | Status: DC

## 2019-07-27 MED FILL — ?ATORVASTATIN 10 MG TABLET: 10 | 30 days supply | Qty: 30 | Fill #0

## 2019-07-27 MED FILL — PANTOPRAZOLE SOD DR 40 MG T: 40 | 30 days supply | Qty: 30 | Fill #0

## 2019-07-27 MED FILL — DICLOFENAC SODIUM 1% GEL: 1 | 25 days supply | Qty: 100 | Fill #0

## 2019-07-27 MED FILL — ?LEVOTHYROXINE 175 MCG TAB: 175 | 30 days supply | Qty: 30 | Fill #0

## 2019-07-27 MED FILL — LOSARTAN POTASSIUM 50 MG TA: 50 | 30 days supply | Qty: 30 | Fill #0

## 2019-07-27 NOTE — Progress Notes (Signed)
Virtual Visit via Telephone Note  I connected with Darren [name] on 07/27/19 at  3:10 PM EDT by telephone and verified that I am speaking with the correct person using two identifiers.   I discussed the limitations, risks, security and privacy concerns of performing an evaluation and management service by telephone and the availability of in person appointments. I also discussed with the patient that there may be a patient responsible charge related to this service. The patient expressed understanding and agreed to proceed.  Patient Location: Home Provider Location: [facility] Others participating in call: none   History of Present Illness:       58 yo male who reports that today he continues to have issues with chronic pain.  He reports a prior work-related injury resulting in chronic issues with back pain.  He also states that he has had issues with bilateral shoulder pain and in the past, his pain was mostly in the l right shoulder however over the past month he is now having pain in his left shoulder and states that he is now having difficulty using his left arm.  He also reports a history of left elbow pain but he believes that it is his left shoulder which is causing him to have difficulty with movement.  He has difficulty reaching forward with his left arm and he cannot pull up his back with his left arm.  He reports that he he can use both hands but is mostly left-handed.        He continues to experience pain in the midportion of his low back.  Pain does not radiate down his legs.  Pain feels like there is constantly someone stabbing or pushing something into his back.  Pain in his back and shoulders is about a 9 on a 0-to-10 scale and pain is constant.  He has not so far gotten any good relief of his pain.  He would like a refill of the gel that he has been rubbing on his back and shoulders to help with pain as he states that this does slightly decrease his pain.         He also reports  the need for refills of his chronic medications for the treatment of his hypertension, hyperlipidemia, hypothyroidism and acid reflux.  He feels that his blood pressure has been controlled on his current medication.  He does not check his blood pressure often but has occasionally checked his blood pressure at pharmacies and he feels that his blood pressures been within normal.  He denies any headaches or dizziness related to his blood pressure.  He continues to take atorvastatin for his cholesterol and he does not believe that this is contributing to any muscle or joint pain.  He believes that his hypothyroidism is controlled on his current dose of thyroid medication.  He does have some fatigue but he believes that this is more so related to his chronic pain issues which also caused him to have difficulty with sleep.  He denies any constipation, no peripheral edema/swelling in his legs/face or arms/hands.  He denies being colder or warmer than others.  He does not feel as if he is having any increased heart rate.  He has had no unexplained or unexpected weight loss or weight gain.  He reports that as long as he takes the pantoprazole that he does not have any issues with acid reflux.  He denies any current abdominal pain, no nausea or vomiting.  No current  burping or belching, no sore throat or difficulty swallowing.  He denies any current issues with bowel or bladder dysfunction related to his back pain.  He has had no chest pain or palpitations and denies any shortness of breath or cough.  He has had no recent fever or chills.  Past Medical History:  Diagnosis Date  . Arthritis    per Medical clearance form.  . Ascending aortic aneurysm ([place]) 06/2016   4.2 cm  . Barrett esophagus 2001   path report in EPIC 2001 c/w Barrett's. endoscopy report unavailable.  Marland Kitchen COPD (chronic obstructive pulmonary disease) ([place])    per medical clearance form.  . Depression    per Medical Clearance form(09/18/11)  . GERD  (gastroesophageal reflux disease)    per Medical clearance form.  . Hypertension    Per Medical Clearance form.  . Hypothyroidism    per Medical Clearance form.    Past Surgical History:  Procedure Laterality Date  . BIOPSY  09/18/2016   Procedure: BIOPSY;  Surgeon: Daneil Dolin, MD;  [facility];  Service: Endoscopy;;  biopsy of distal esophagus  . ESOPHAGOGASTRODUODENOSCOPY  2001   barrett's per path  . ESOPHAGOGASTRODUODENOSCOPY (EGD) WITH PROPOFOL N/A 09/18/2016   Procedure: ESOPHAGOGASTRODUODENOSCOPY (EGD) WITH PROPOFOL;  Surgeon: Daneil Dolin, MD;  Location: [facility];  Service: Endoscopy;  Laterality: N/A;  1200  . [place]  . MULTIPLE EXTRACTIONS WITH ALVEOLOPLASTY  11/30/2011   Procedure: MULTIPLE EXTRACION WITH ALVEOLOPLASTY;  Surgeon: Gae Bon, DDS;  Location: [facility];  Service: Oral Surgery;  Laterality: Bilateral;    Family History  Problem Relation Age of Onset  . Cancer Father        lung  . Stroke Father   . Heart attack Father 89  . Lung cancer Mother   . Thyroid disease Sister   . Colon cancer Neg Hx   . Esophageal cancer Neg Hx     Social History   Tobacco Use  . Smoking status: Former Smoker    Packs/day: 1.00    Years: 26.00    Pack years: 26.00    Types: Cigarettes    Quit date: 09/15/2004    Years since quitting: 14.8  . Smokeless tobacco: Never Used  . Tobacco comment: Quit in 2005  Substance Use Topics  . Alcohol use: Yes    Comment: ON WEEKENDS  . Drug use: No     No Known Allergies     Observations/Objective: No vital signs or physical exam conducted as visit was done via telephone  Assessment and Plan: 1. Acute pain of left shoulder Patient reports that he has had issues with chronic pain in his right shoulder but more recently he has had onset of pain in his left shoulder and reports that he cannot properly use his left shoulder/arm to reach out or pull back on objects.  Patient will be  referred to orthopedics for further evaluation and treatment. - AMB referral to orthopedics  2. Chronic midline low back pain without sciatica He reports longstanding issues with chronic midline low back pain which he believes was work-related and patient still some frustration that he did not receive disability as a result of his injuries.  He will be referred to pain management/pain medicine clinic for further evaluation and treatment of his chronic mid back pain. - Ambulatory referral to Pain Clinic  3. Hypothyroidism, unspecified type TSH level was done earlier in the year and  patient believes that he has had no symptoms to suggest the need for any change in his dose of thyroid medication.  He is provided with refill of levothyroxine 175 mcg daily. - levothyroxine (SYNTHROID) 175 MCG tablet; Take 1 tablet (175 mcg total) by mouth daily.  Dispense: 90 tablet; Refill: 1  4. Essential hypertension He believes that his blood pressure is currently stable and controlled.  Refill provided of losartan 50 mg once daily.  He is also encouraged to follow a low sodium diet as well as maintaining a healthy weight.  He is encouraged to attempt to perform exercises such as walking as tolerated.  Patient had comprehensive metabolic panel earlier in the year with normal creatinine and normal potassium levels. - losartan (COZAAR) 50 MG tablet; Take 1 tablet (50 mg total) by mouth daily.  Dispense: 90 tablet; Refill: 1  5. Gastroesophageal reflux disease Patient reports that his reflux symptoms are controlled with the use of Protonix for which refill was provided at today's visit.  Discussed avoiding known trigger foods, attempting to avoid use of nonsteroidal anti-inflammatories as well as avoidance of late night eating to help with GERD symptoms. - pantoprazole (PROTONIX) 40 MG tablet; Take 1 tablet (40 mg total) by mouth daily.  Dispense: 90 tablet; Refill: 2  6. Mixed hyperlipidemia Patient had lipid panel  done October 17, 2018 at which time he had elevated total cholesterol of 224, elevated triglycerides of 268 and LDL of 125.  He was started on atorvastatin 10 mg daily by his provider at the time.  He reports compliance with the medication and no current side effects of which he is aware.  He also had comprehensive metabolic panel done on the same day with normal liver enzymes with the exception of mild increase in ALT at 48 with normal being 0-44.  Discussed with patient that he should schedule follow-up appointment for recheck of liver enzymes, lipid panel and may also wish to have hemoglobin A1c as glucose was 110 on fasting comprehensive metabolic panel in January 2020.  Patient states that he is trying to avoid coming into the office due to his fears regarding COVID-19 infection. - atorvastatin (LIPITOR) 10 MG tablet; Take 1 tablet (10 mg total) by mouth daily.  Dispense: 90 tablet; Refill: 1   7. Left elbow pain Patient reports issues with chronic left elbow pain and requests refill of diclofenac which was provided at today's visit. - diclofenac sodium (VOLTAREN) 1 % GEL; Apply 4 g topically 4 (four) times daily.  Dispense: 150 g; Refill: 5   Follow Up Instructions:Return in about 4 months (around 11/26/2019) for Chronic medical issues/labs, sooner if needed.    I discussed the assessment and treatment plan with the patient. The patient was provided an opportunity to ask questions and all were answered. The patient agreed with the plan and demonstrated an understanding of the instructions.   The patient was advised to call back or seek an in-person evaluation if the symptoms worsen or if the condition fails to improve as anticipated.  I provided 25 minutes of non-face-to-face time during this encounter.   Antony Blackbird, MD

## 2019-07-27 NOTE — Progress Notes (Signed)
Patient verified DOB Patient has eaten today. Patient has taken medication today. Patient currently has his power out and contacted [employer] for his outage. Patient complains of chronic pain being present and increased after work. Patient states he has pain in the heel of his foot. Patient is having difficulty grabbing with the left hand.

## 2019-07-29 ENCOUNTER — Encounter: Payer: Self-pay | Admitting: Family Medicine

## 2019-07-31 ENCOUNTER — Telehealth: Payer: Self-pay | Admitting: Internal Medicine

## 2019-07-31 NOTE — Telephone Encounter (Signed)
Per pt the orthopedics told him that the only order they received was for his left arm. Per pt his right arm is hurting as well and would like for provider to put in order for his right arm as well. Staff asked patient if he discussed this with provider during his visit on 07-27-19 and he stated he did. Informed patient message will be sent to provider and patient verbalized understanding.

## 2019-07-31 NOTE — Telephone Encounter (Signed)
Patient came in wanting to know if another order could be sent over for the patient to be seen for his right arm states his right arm has some swelling and he is having difficulty with his left arm. Please follow up.

## 2019-07-31 NOTE — Telephone Encounter (Signed)
Patient came in stating he cannot afford where he was referred to for his back states he would like to know if he could be referred elsewhere. Please follow up.

## 2019-08-01 ENCOUNTER — Other Ambulatory Visit (HOSPITAL_BASED_OUTPATIENT_CLINIC_OR_DEPARTMENT_OTHER): Payer: Medicaid - Out of State | Admitting: Family Medicine

## 2019-08-01 DIAGNOSIS — G8929 Other chronic pain: Secondary | ICD-10-CM

## 2019-08-01 DIAGNOSIS — M25511 Pain in right shoulder: Secondary | ICD-10-CM

## 2019-08-01 NOTE — Telephone Encounter (Signed)
Informed patient with what provider stated and he verbalized understanding.   If patient calls back for Ortho number it is:  [employer] [phone]

## 2019-08-01 NOTE — Telephone Encounter (Signed)
Unfortunately  He is uninsured and we don't have more resources . Thank you

## 2019-08-01 NOTE — Progress Notes (Signed)
Patient ID: Darren [name], male   DOB: 1961-01-15, 58 y.o.   MRN: [id number]   Patient called received that patient would also like referral for right shoulder pain in addition to his issues with left shoulder in which he has recently had difficulty with use of the left arm/shoulder secondary to pain.

## 2019-08-01 NOTE — Telephone Encounter (Signed)
I will place the referral regarding his right shoulder as well but I placed the order regarding his left because he said that both shoulders were hurting chronically but that he had developed recent difficulty using his left arm

## 2019-08-02 ENCOUNTER — Other Ambulatory Visit: Payer: Self-pay

## 2019-08-02 ENCOUNTER — Encounter: Payer: Self-pay | Admitting: Family Medicine

## 2019-08-02 ENCOUNTER — Ambulatory Visit (INDEPENDENT_AMBULATORY_CARE_PROVIDER_SITE_OTHER): Payer: Medicaid - Out of State | Admitting: Family Medicine

## 2019-08-02 DIAGNOSIS — M25512 Pain in left shoulder: Secondary | ICD-10-CM

## 2019-08-02 DIAGNOSIS — M25511 Pain in right shoulder: Secondary | ICD-10-CM

## 2019-08-02 DIAGNOSIS — G8929 Other chronic pain: Secondary | ICD-10-CM

## 2019-08-02 NOTE — Progress Notes (Signed)
Office Visit Note   Patient: Darren [name]           Date of Birth: [birth year]           MRN: [id number] Visit Date: 08/02/2019 Requested by: Antony Blackbird, MD [place],  [place] [id number] PCP: Ladell Pier, MD  Subjective: Chief Complaint  Patient presents with  . Right Shoulder - Pain    Aching pain in the right shoulder off & on x 1 year. Worsening. Notices pain in both shoulders with playing guitar. Aspercreme helps some.  . Left Shoulder - Pain    Pain has worsened over the past 5-6 months. Will wake from sleep if he turns over on either shoulder.    HPI: He is here with left greater than right shoulder pain.  Right shoulder started hurting more than a year ago with no injury.  Intermittent pain, not feeling as badly today as the left.  Pain on top of the shoulder with reaching overhead.  The left shoulder hurts with lateral movements, occasionally it crunches when he moves it.  He has multiple other orthopedic issues but his primary concern today is the left shoulder.              ROS: No fevers or chills.  All other systems were reviewed and are negative.  Objective: Vital Signs: There were no vitals taken for this visit.  Physical Exam:  General:  Alert and oriented, in no acute distress. Pulm:  Breathing unlabored. Psy:  Normal mood, congruent affect. Skin: No rash or erythema. Left shoulder: Full active range of motion compared to the right.  Slight tenderness at the [facility] joint of both shoulders.  There is palpable crepitus in the left shoulder with active circumduction.  Tender to palpation in the subacromial space.  Pain with passive impingement, especially abduction and internal rotation.  Rotator cuff strength is 5/5 throughout but he does have some pain with empty can test.  Speeds test is negative.  Imaging: None today.  Assessment & Plan: 1.  Chronic left shoulder pain, suspect subacromial bursitis.  Cannot rule out partial supraspinatus  tear. -Discussed discussed options with him and he would like to try a subacromial injection but would prefer to do it Friday afternoon after work so that he has time to rest over the weekend.  He will come back Friday for injection. -Home exercises given as well.  Consider formal physical therapy.     Procedures: No procedures performed  No notes on file     PMFS History: Patient Active Problem List   Diagnosis Date Noted  . Epistaxis 11/04/2017  . Class 1 obesity due to excess calories without serious comorbidity with body mass index (BMI) of 31.0 to 31.9 in adult 11/04/2017  . Bilateral wrist pain 04/22/2017  . Disorder of left sacroiliac joint 12/21/2016  . Spondylosis without myelopathy or radiculopathy, lumbar region 12/21/2016  . Lumbar degenerative disc disease 12/21/2016  . COPD (chronic obstructive pulmonary disease) with emphysema ([place]) 07/31/2016  . HTN (hypertension) 07/31/2016  . Dilation of thoracic aorta ([place]) 07/31/2016  . Esophageal dysphagia 07/09/2016  . Chronic midline low back pain 07/09/2016  . Sensorineural hearing loss (SNHL), bilateral 03/30/2016  . Vitamin D deficiency 04/06/2014  . Hypothyroidism 10/31/2013  . GERD (gastroesophageal reflux disease) 11/25/2011  . Barrett esophagus 09/29/1999   Past Medical History:  Diagnosis Date  . Arthritis    per Medical clearance form.  . Ascending aortic aneurysm ([place])  06/2016   4.2 cm  . Barrett esophagus 2001   path report in EPIC 2001 c/w Barrett's. endoscopy report unavailable.  Marland Kitchen COPD (chronic obstructive pulmonary disease) ([place])    per medical clearance form.  . Depression    per Medical Clearance form(09/18/11)  . GERD (gastroesophageal reflux disease)    per Medical clearance form.  . Hypertension    Per Medical Clearance form.  . Hypothyroidism    per Medical Clearance form.    Family History  Problem Relation Age of Onset  . Cancer Father        lung  . Stroke Father   . Heart attack  Father 17  . Lung cancer Mother   . Thyroid disease Sister   . Colon cancer Neg Hx   . Esophageal cancer Neg Hx     Past Surgical History:  Procedure Laterality Date  . BIOPSY  09/18/2016   Procedure: BIOPSY;  Surgeon: Daneil Dolin, MD;  [facility];  Service: Endoscopy;;  biopsy of distal esophagus  . ESOPHAGOGASTRODUODENOSCOPY  2001   barrett's per path  . ESOPHAGOGASTRODUODENOSCOPY (EGD) WITH PROPOFOL N/A 09/18/2016   Procedure: ESOPHAGOGASTRODUODENOSCOPY (EGD) WITH PROPOFOL;  Surgeon: Daneil Dolin, MD;  [facility];  Service: Endoscopy;  Laterality: N/A;  1200  . [place]  . MULTIPLE EXTRACTIONS WITH ALVEOLOPLASTY  11/30/2011   Procedure: MULTIPLE EXTRACION WITH ALVEOLOPLASTY;  Surgeon: Gae Bon, DDS;  Location: [facility];  Service: Oral Surgery;  Laterality: Bilateral;   Social History   Occupational History  . Not on file  Tobacco Use  . Smoking status: Former Smoker    Packs/day: 1.00    Years: 26.00    Pack years: 26.00    Types: Cigarettes    Quit date: 09/15/2004    Years since quitting: 14.8  . Smokeless tobacco: Never Used  . Tobacco comment: Quit in 2005  Substance and Sexual Activity  . Alcohol use: Yes    Comment: ON WEEKENDS  . Drug use: No  . Sexual activity: Not Currently    Birth control/protection: None

## 2019-08-04 ENCOUNTER — Ambulatory Visit (INDEPENDENT_AMBULATORY_CARE_PROVIDER_SITE_OTHER): Payer: Self-pay | Admitting: Family Medicine

## 2019-08-04 ENCOUNTER — Other Ambulatory Visit: Payer: Self-pay

## 2019-08-04 ENCOUNTER — Encounter: Payer: Self-pay | Admitting: Family Medicine

## 2019-08-04 DIAGNOSIS — M25512 Pain in left shoulder: Secondary | ICD-10-CM

## 2019-08-04 DIAGNOSIS — G8929 Other chronic pain: Secondary | ICD-10-CM

## 2019-08-04 NOTE — Progress Notes (Signed)
Subjective: He is here for a planned left shoulder subacromial injection.  Procedure: Left shoulder subacromial injection: After sterile prep with Betadine, injected 5 cc 1% lidocaine without epinephrine and 40 mg methylprednisolone from posterior approach into subacromial space.  He will follow-up as previously discussed.

## 2019-08-30 MED FILL — DICLOFENAC SODIUM 1% GEL: 1 | 25 days supply | Qty: 100 | Fill #1

## 2019-08-30 MED FILL — ?ATORVASTATIN 10 MG TABLET: 10 | 30 days supply | Qty: 30 | Fill #1

## 2019-08-30 MED FILL — ?LEVOTHYROXINE 175 MCG TAB: 175 | 30 days supply | Qty: 30 | Fill #1

## 2019-08-30 MED FILL — PANTOPRAZOLE SOD DR 40 MG T: 40 | 30 days supply | Qty: 30 | Fill #1

## 2019-08-30 MED FILL — LOSARTAN POTASSIUM 50 MG TA: 50 | 30 days supply | Qty: 30 | Fill #1

## 2019-09-27 MED FILL — PANTOPRAZOLE SOD DR 40 MG T: 40 | 30 days supply | Qty: 30 | Fill #2

## 2019-09-27 MED FILL — LEVOTHYROXINE 175 MCG TAB: 175 | 30 days supply | Qty: 30 | Fill #2

## 2019-09-27 MED FILL — ?ATORVASTATIN 10 MG TABLET: 10 | 30 days supply | Qty: 30 | Fill #2

## 2019-09-27 MED FILL — LOSARTAN POTASSIUM 50 MG TA: 50 | 30 days supply | Qty: 30 | Fill #2

## 2019-10-17 ENCOUNTER — Other Ambulatory Visit: Payer: Self-pay

## 2019-10-17 ENCOUNTER — Encounter (HOSPITAL_COMMUNITY): Payer: Self-pay | Admitting: Emergency Medicine

## 2019-10-17 ENCOUNTER — Ambulatory Visit (HOSPITAL_COMMUNITY)
Admission: EM | Admit: 2019-10-17 | Discharge: 2019-10-17 | Disposition: A | Discharge status: Home / Self Care | Payer: HRSA Program | Attending: Internal Medicine | Admitting: Internal Medicine

## 2019-10-17 DIAGNOSIS — J449 Chronic obstructive pulmonary disease, unspecified: Secondary | ICD-10-CM | POA: Diagnosis present

## 2019-10-17 DIAGNOSIS — R0981 Nasal congestion: Secondary | ICD-10-CM | POA: Diagnosis present

## 2019-10-17 DIAGNOSIS — R519 Headache, unspecified: Secondary | ICD-10-CM | POA: Insufficient documentation

## 2019-10-17 DIAGNOSIS — U071 COVID-19: Secondary | ICD-10-CM | POA: Diagnosis not present

## 2019-10-17 DIAGNOSIS — Z20822 Contact with and (suspected) exposure to covid-19: Secondary | ICD-10-CM

## 2019-10-17 MED ORDER — AMOXICILLIN-POT CLAVULANATE 875-125 MG PO TABS: 1.0000 | ORAL_TABLET | Freq: Two times a day (BID) | ORAL | 0 refills | Status: DC

## 2019-10-17 MED ORDER — DEXAMETHASONE SODIUM PHOSPHATE 10 MG/ML IJ SOLN
INTRAMUSCULAR | Status: AC
  Filled 2019-10-17: qty 1

## 2019-10-17 MED ORDER — DEXAMETHASONE SODIUM PHOSPHATE 10 MG/ML IJ SOLN
10.0000 mg | Freq: Once | INTRAMUSCULAR | Status: AC
  Administered 2019-10-17: 19:00:00 10 mg via INTRAMUSCULAR

## 2019-10-17 MED ORDER — KETOROLAC TROMETHAMINE 30 MG/ML IJ SOLN
30.0000 mg | Freq: Once | INTRAMUSCULAR | Status: AC
  Administered 2019-10-17: 19:00:00 30 mg via INTRAMUSCULAR

## 2019-10-17 MED ORDER — KETOROLAC TROMETHAMINE 30 MG/ML IJ SOLN
INTRAMUSCULAR | Status: AC
  Filled 2019-10-17: qty 1

## 2019-10-17 NOTE — ED Triage Notes (Signed)
Pt here for nasal congestion and sinus pressure  

## 2019-10-17 NOTE — ED Provider Notes (Signed)
[facility]    CSN: [id number] Arrival date & time: 10/17/19  1606      History   Chief Complaint Chief Complaint  Patient presents with  . Nasal Congestion    HPI Darren [name] is a 59 y.o. male.   HPI  Encounter for COVID testing. Patient denies any known COVID-19 exposure. He presents today with symptoms of nasal congestion, headache, cough-non productive, and shortness of breath. Patient has a history of COPD. Onset of symptoms 5 days prior.  High Risk For Complication Related to [id number]: COPD, Hypertension, and Cardiovascular diease. Past Medical History:  Diagnosis Date  . Arthritis    per Medical clearance form.  . Ascending aortic aneurysm ([place]) 06/2016   4.2 cm  . Barrett esophagus 2001   path report in EPIC 2001 c/w Barrett's. endoscopy report unavailable.  Marland Kitchen COPD (chronic obstructive pulmonary disease) ([place])    per medical clearance form.  . Depression    per Medical Clearance form(09/18/11)  . GERD (gastroesophageal reflux disease)    per Medical clearance form.  . Hypertension    Per Medical Clearance form.  . Hypothyroidism    per Medical Clearance form.    Patient Active Problem List   Diagnosis Date Noted  . Epistaxis 11/04/2017  . Class 1 obesity due to excess calories without serious comorbidity with body mass index (BMI) of 31.0 to 31.9 in adult 11/04/2017  . Bilateral wrist pain 04/22/2017  . Disorder of left sacroiliac joint 12/21/2016  . Spondylosis without myelopathy or radiculopathy, lumbar region 12/21/2016  . Lumbar degenerative disc disease 12/21/2016  . COPD (chronic obstructive pulmonary disease) with emphysema ([place]) 07/31/2016  . HTN (hypertension) 07/31/2016  . Dilation of thoracic aorta ([place]) 07/31/2016  . Esophageal dysphagia 07/09/2016  . Chronic midline low back pain 07/09/2016  . Sensorineural hearing loss (SNHL), bilateral 03/30/2016  . Vitamin D deficiency 04/06/2014  . Hypothyroidism 10/31/2013  . GERD  (gastroesophageal reflux disease) 11/25/2011  . Barrett esophagus 09/29/1999    Past Surgical History:  Procedure Laterality Date  . BIOPSY  09/18/2016   Procedure: BIOPSY;  Surgeon: Daneil Dolin, MD;  [facility];  Service: Endoscopy;;  biopsy of distal esophagus  . ESOPHAGOGASTRODUODENOSCOPY  2001   barrett's per path  . ESOPHAGOGASTRODUODENOSCOPY (EGD) WITH PROPOFOL N/A 09/18/2016   Procedure: ESOPHAGOGASTRODUODENOSCOPY (EGD) WITH PROPOFOL;  Surgeon: Daneil Dolin, MD;  [facility];  Service: Endoscopy;  Laterality: N/A;  1200  . [place]  . MULTIPLE EXTRACTIONS WITH ALVEOLOPLASTY  11/30/2011   Procedure: MULTIPLE EXTRACION WITH ALVEOLOPLASTY;  Surgeon: Gae Bon, DDS;  Location: [facility];  Service: Oral Surgery;  Laterality: Bilateral;       Home Medications    Prior to Admission medications   Medication Sig Start Date End Date Taking? Authorizing Provider  atorvastatin (LIPITOR) 10 MG tablet Take 1 tablet (10 mg total) by mouth daily. 07/27/19   Fulp, Cammie, MD  diclofenac sodium (VOLTAREN) 1 % GEL Apply 4 g topically 4 (four) times daily. 07/27/19   Fulp, Cammie, MD  levothyroxine (SYNTHROID) 175 MCG tablet Take 1 tablet (175 mcg total) by mouth daily. 07/27/19   Fulp, Cammie, MD  losartan (COZAAR) 50 MG tablet Take 1 tablet (50 mg total) by mouth daily. 07/27/19   Fulp, Cammie, MD  methocarbamol (ROBAXIN) 500 MG tablet Take 1 tablet (500 mg total) by mouth every 8 (eight) hours as needed for muscle spasms. Patient not  taking: Reported on 08/02/2019 11/02/18   Argentina Donovan, PA-C  naproxen (NAPROSYN) 500 MG tablet TAKE 1 TABLET (500 MG TOTAL) BY MOUTH 2 (TWO) TIMES DAILY WITH A MEAL AS NEEDED FOR PAIN. Patient not taking: Reported on 08/02/2019 11/29/18   Ladell Pier, MD  pantoprazole (PROTONIX) 40 MG tablet Take 1 tablet (40 mg total) by mouth daily. 07/27/19   Antony Blackbird, MD    Family History Family History  Problem  Relation Age of Onset  . Cancer Father        lung  . Stroke Father   . Heart attack Father 15  . Lung cancer Mother   . Thyroid disease Sister   . Colon cancer Neg Hx   . Esophageal cancer Neg Hx     Social History Social History   Tobacco Use  . Smoking status: Former Smoker    Packs/day: 1.00    Years: 26.00    Pack years: 26.00    Types: Cigarettes    Quit date: 09/15/2004    Years since quitting: 15.0  . Smokeless tobacco: Never Used  . Tobacco comment: Quit in 2005  Substance Use Topics  . Alcohol use: Yes    Comment: ON WEEKENDS  . Drug use: No     Allergies   Patient has no known allergies.   Review of Systems Review of Systems Pertinent negatives listed in HPI  Physical Exam Triage Vital Signs ED Triage Vitals [10/17/19 1731]  Enc Vitals Group     BP (!) 152/93     Pulse Rate 74     Resp 18     Temp 98.1 F (36.7 C)     Temp Source Oral     SpO2 95 %     Weight      Height      Head Circumference      Peak Flow      Pain Score 2     Pain Loc      Pain Edu?      Excl. in [place]?    No data found.  Updated Vital Signs BP (!) 152/93 (BP Location: Right Arm)   Pulse 74   Temp 98.1 F (36.7 C) (Oral)   Resp 18   SpO2 95%   Visual Acuity Right Eye Distance:   Left Eye Distance:   Bilateral Distance:    Right Eye Near:   Left Eye Near:    Bilateral Near:     Physical Exam Constitutional:      General: He is not in acute distress.    Appearance: He is not toxic-appearing.  HENT:     Nose: Mucosal edema and congestion present.     Right Sinus: Maxillary sinus tenderness present.     Left Sinus: Maxillary sinus tenderness present.  Cardiovascular:     Rate and Rhythm: Normal rate and regular rhythm.  Pulmonary:     Effort: Pulmonary effort is normal.     Breath sounds: Decreased breath sounds present. No wheezing or rhonchi.  Musculoskeletal:     Cervical back: No rigidity.  Lymphadenopathy:     Cervical: No cervical adenopathy.   Skin:    General: Skin is warm.  Neurological:     Mental Status: He is alert.  Psychiatric:        Attention and Perception: Attention normal.    UC Treatments / Results  Labs (all labs ordered are listed, but only abnormal results are displayed) Labs Reviewed  NOVEL  CORONAVIRUS, NAA (HOSP ORDER, SEND-OUT TO REF LAB; TAT 18-24 HRS)    EKG   Radiology No results found.  Procedures Procedures (including critical care time)  Medications Ordered in UC Medications - No data to display  Initial Impression / Assessment and Plan / UC Course  I have reviewed the triage vital signs and the nursing notes.  Pertinent labs & imaging results that were available during my care of the patient were reviewed by me and considered in my medical decision making (see chart for details).   Encounter for COVID-19 testing and evaluation of sinusitis. No known exposure. Treating empirically for acute sinusitis with Augmentin BID x 10 days. Offered nasal spray, declined due to limited finances.  Red flags discussed. Your COVID 19 results will be available in 48-72 hours. Negative results are immediately resulted to Mychart. All positive results are communicated with a phone call from our office. Final Clinical Impressions(s) / UC Diagnoses   Final diagnoses:  Sinus headache  Nasal congestion  COPD, mild ([place])  Encounter for laboratory testing for COVID-19 virus     Discharge Instructions     Your COVID 19 results will be available in 48-72 hours. Negative results are immediately resulted to Mychart. All positive results are communicated with a phone call from our office.     ED Prescriptions    Medication Sig Dispense Auth. Provider   amoxicillin-clavulanate (AUGMENTIN) 875-125 MG tablet Take 1 tablet by mouth 2 (two) times daily. 20 tablet Scot Jun, FNP     PDMP not reviewed this encounter.   Scot Jun, FNP 10/18/19 1244

## 2019-10-17 NOTE — Discharge Instructions (Signed)
Your COVID 19 results will be available in 48-72 hours. Negative results are immediately resulted to Mychart. All positive results are communicated with a phone call from our office.

## 2019-10-18 MED FILL — AMOX-CLAV 875-125 MG TABLET: 875-125 | 10 days supply | Qty: 20 | Fill #0

## 2019-10-19 ENCOUNTER — Telehealth (HOSPITAL_COMMUNITY): Payer: Self-pay | Admitting: Emergency Medicine

## 2019-10-19 LAB — NOVEL CORONAVIRUS, NAA (HOSP ORDER, SEND-OUT TO REF LAB; TAT 18-24 HRS): SARS-CoV-2, NAA: DETECTED — AB

## 2019-10-19 NOTE — Telephone Encounter (Signed)

## 2019-10-20 ENCOUNTER — Other Ambulatory Visit: Payer: Self-pay | Admitting: Adult Health

## 2019-10-20 DIAGNOSIS — U071 COVID-19: Secondary | ICD-10-CM

## 2019-10-20 NOTE — Progress Notes (Signed)
  I connected by phone with Darren [name] on 10/20/2019 at 12:40 PM to discuss the potential use of an new treatment for mild to moderate COVID-19 viral infection in non-hospitalized patients.  This patient is a 59 y.o. male that meets the FDA criteria for Emergency Use Authorization of bamlanivimab or casirivimab\imdevimab.  Has a (+) direct SARS-CoV-2 viral test result  Has mild or moderate COVID-19   Is ? 59 years of age and weighs ? 40 kg  Is NOT hospitalized due to COVID-19  Is NOT requiring oxygen therapy or requiring an increase in baseline oxygen flow rate due to COVID-19  Is within 10 days of symptom onset  Has at least one of the high risk factor(s) for progression to severe COVID-19 and/or hospitalization as defined in EUA.  Specific high risk criteria : Hypertension and COPD at age 63   I have spoken and communicated the following to the patient or parent/caregiver:  1. FDA has authorized the emergency use of bamlanivimab and casirivimab\imdevimab for the treatment of mild to moderate COVID-19 in adults and pediatric patients with positive results of direct SARS-CoV-2 viral testing who are 69 years of age and older weighing at least 40 kg, and who are at high risk for progressing to severe COVID-19 and/or hospitalization.  2. The significant known and potential risks and benefits of bamlanivimab and casirivimab\imdevimab, and the extent to which such potential risks and benefits are unknown.  3. Information on available alternative treatments and the risks and benefits of those alternatives, including clinical trials.  4. Patients treated with bamlanivimab and casirivimab\imdevimab should continue to self-isolate and use infection control measures (e.g., wear mask, isolate, social distance, avoid sharing personal items, clean and disinfect "high touch" surfaces, and frequent handwashing) according to CDC guidelines.   5. The patient or parent/caregiver has the option to  accept or refuse bamlanivimab or casirivimab\imdevimab .  After reviewing this information with the patient, The patient agreed to proceed with receiving the bamlanimivab infusion and will be provided a copy of the Fact sheet prior to receiving the infusion.Scot Dock 10/20/2019 12:40 PM

## 2019-10-24 ENCOUNTER — Ambulatory Visit (HOSPITAL_COMMUNITY)
Admission: RE | Admit: 2019-10-24 | Discharge: 2019-10-24 | Disposition: A | Discharge status: Home / Self Care | Payer: HRSA Program | Source: Ambulatory Visit | Attending: Pulmonary Disease | Admitting: Pulmonary Disease

## 2019-10-24 DIAGNOSIS — U071 COVID-19: Secondary | ICD-10-CM | POA: Diagnosis present

## 2019-10-24 MED ORDER — FAMOTIDINE IN NACL 20-0.9 MG/50ML-% IV SOLN: 20.0000 mg | Freq: Once | INTRAVENOUS | Status: DC | PRN

## 2019-10-24 MED ORDER — SODIUM CHLORIDE 0.9 % IV SOLN: INTRAVENOUS | Status: DC | PRN

## 2019-10-24 MED ORDER — DIPHENHYDRAMINE HCL 50 MG/ML IJ SOLN: 50.0000 mg | Freq: Once | INTRAMUSCULAR | Status: DC | PRN

## 2019-10-24 MED ORDER — EPINEPHRINE 0.3 MG/0.3ML IJ SOAJ: 0.3000 mg | Freq: Once | INTRAMUSCULAR | Status: DC | PRN

## 2019-10-24 MED ORDER — SODIUM CHLORIDE 0.9 % IV SOLN
700.0000 mg | Freq: Once | INTRAVENOUS | Status: AC
  Administered 2019-10-24: 700 mg via INTRAVENOUS
  Filled 2019-10-24: qty 20

## 2019-10-24 MED ORDER — ALBUTEROL SULFATE HFA 108 (90 BASE) MCG/ACT IN AERS: 2.0000 | INHALATION_SPRAY | Freq: Once | RESPIRATORY_TRACT | Status: DC | PRN

## 2019-10-24 MED ORDER — METHYLPREDNISOLONE SODIUM SUCC 125 MG IJ SOLR: 125.0000 mg | Freq: Once | INTRAMUSCULAR | Status: DC | PRN

## 2019-10-24 NOTE — Progress Notes (Signed)
  Diagnosis: COVID-19  Physician: Joya Gaskins   Procedure: [facility] Med: bamlanivimab infusion - Provided patient with bamlanimivab fact sheet for patients, parents and caregivers prior to infusion.  Complications: No immediate complications noted.  Discharge: Discharged home   Darren [name] 10/24/2019

## 2019-10-24 NOTE — Discharge Instructions (Signed)

## 2019-10-26 ENCOUNTER — Ambulatory Visit: Payer: HRSA Program | Attending: Internal Medicine | Admitting: Internal Medicine

## 2019-10-26 ENCOUNTER — Other Ambulatory Visit: Payer: Self-pay

## 2019-10-26 DIAGNOSIS — U071 COVID-19: Secondary | ICD-10-CM

## 2019-10-26 NOTE — Progress Notes (Signed)
Patient has been called and DOB has been verified. Patient has been screened and transferred to PCP to start phone visit.    Headaches and sinus issues. Loss of taste

## 2019-10-26 NOTE — Progress Notes (Signed)
Virtual Visit via Telephone Note Due to current restrictions/limitations of in-office visits due to the COVID-19 pandemic, this scheduled clinical appointment was converted to a telehealth visit  I connected with Darren [name] on 10/26/19 at 10:48 a.m by telephone and verified that I am speaking with the correct person using two identifiers. I am in my office.  The patient is at home.  Only the patient and myself participated in this encounter.  I discussed the limitations, risks, security and privacy concerns of performing an evaluation and management service by telephone and the availability of in person appointments. I also discussed with the patient that there may be a patient responsible charge related to this service. The patient expressed understanding and agreed to proceed.   History of Present Illness: Pt with hx of GERD, hypothyroid,HTN,HL,chronic LBP and Barrett's esophagus.    Patient last evaluated by Dr. Chapman Fitch 06/2019.  Purpose of today's visit is chronic disease management.  Pt tested pos for COVID 10/17/2019.  At the time he was having some rhinorrhea and nasal congestion.  He was seen at urgent care where he had the test done.  He was prescribed some Augmentin antibiotics. -He was given antibody infusion therapy on 10/24/2019. Still reports some nasal congestion and headaches.  He denies any significant cough or shortness of breath.  No fever.  He does have loss of taste.  He has been using Tylenol and Afrin nasal spray.   Observations/Objective: Results for orders placed or performed during the hospital encounter of 10/17/19  Novel Coronavirus, NAA (Hosp order, Send-out to Ref Lab; TAT 18-24 hrs   Specimen: Nasopharyngeal Swab; Respiratory  Result Value Ref Range   SARS-CoV-2, NAA DETECTED (A) NOT DETECTED   Coronavirus Source NASOPHARYNGEAL      Chemistry      Component Value Date/Time   NA 139 10/17/2018 1213   K 4.9 10/17/2018 1213   CL 100 10/17/2018 1213   CO2 23  10/17/2018 1213   BUN 14 10/17/2018 1213   CREATININE 0.90 10/17/2018 1213   CREATININE 0.79 07/09/2016 1008      Component Value Date/Time   CALCIUM 9.5 10/17/2018 1213   ALKPHOS 82 10/17/2018 1213   AST 28 10/17/2018 1213   ALT 48 (H) 10/17/2018 1213   BILITOT 0.4 10/17/2018 1213     Lab Results  Component Value Date   CHOL 224 (H) 10/17/2018   HDL 45 10/17/2018   LDLCALC 125 (H) 10/17/2018   TRIG 268 (H) 10/17/2018   CHOLHDL 5.0 10/17/2018     Assessment and Plan: 1. COVID-19 virus infection Advised patient to stop the Afrin nasal spray given that he has history of HTN.  Also Afrin nasal spray causes a lot of rebound congestion when used continuously.  I recommend using some Coricidin HBP over-the-counter or Vicks vapor rub.  He can use the ibuprofen or Tylenol interchangeably for the headache. -He is currently in quarantine at home.  He is not allowed anyone to come to the house. -Once he gets over this I told the patient that he still needs to wear a mask when in public, maintain social distancing and practice good handwashing.   Follow Up Instructions: Keep f/u appt with me next mth   I discussed the assessment and treatment plan with the patient. The patient was provided an opportunity to ask questions and all were answered. The patient agreed with the plan and demonstrated an understanding of the instructions.   The patient was advised to call back  or seek an in-person evaluation if the symptoms worsen or if the condition fails to improve as anticipated.  I provided 15 minutes of non-face-to-face time during this encounter.   Karle Plumber, MD

## 2019-10-30 MED FILL — ?LEVOTHYROXINE 175 MCG TAB: 175 | 30 days supply | Qty: 30 | Fill #3

## 2019-10-30 MED FILL — ?ATORVASTATIN 10 MG TABLET: 10 | 30 days supply | Qty: 30 | Fill #3

## 2019-10-30 MED FILL — PANTOPRAZOLE SOD DR 40 MG T: 40 | 30 days supply | Qty: 30 | Fill #3

## 2019-10-30 MED FILL — LOSARTAN POTASSIUM 50 MG TA: 50 | 30 days supply | Qty: 30 | Fill #3

## 2019-10-31 MED FILL — DICLOFENAC SODIUM 1% GEL: 1 | 6 days supply | Qty: 100 | Fill #2

## 2019-11-24 ENCOUNTER — Ambulatory Visit: Payer: Self-pay | Attending: Internal Medicine | Admitting: Internal Medicine

## 2019-11-24 ENCOUNTER — Other Ambulatory Visit: Payer: Self-pay

## 2019-11-24 ENCOUNTER — Encounter: Payer: Self-pay | Admitting: Internal Medicine

## 2019-11-24 ENCOUNTER — Other Ambulatory Visit: Payer: Self-pay | Admitting: Internal Medicine

## 2019-11-24 VITALS — BP 142/94 | HR 69 | Temp 98.4°F | Resp 16 | Wt 180.8 lb

## 2019-11-24 DIAGNOSIS — E782 Mixed hyperlipidemia: Secondary | ICD-10-CM

## 2019-11-24 DIAGNOSIS — D170 Benign lipomatous neoplasm of skin and subcutaneous tissue of head, face and neck: Secondary | ICD-10-CM

## 2019-11-24 DIAGNOSIS — E039 Hypothyroidism, unspecified: Secondary | ICD-10-CM

## 2019-11-24 DIAGNOSIS — I10 Essential (primary) hypertension: Secondary | ICD-10-CM

## 2019-11-24 DIAGNOSIS — K219 Gastro-esophageal reflux disease without esophagitis: Secondary | ICD-10-CM

## 2019-11-24 MED ORDER — ATORVASTATIN CALCIUM 10 MG PO TABS: 10.0000 mg | ORAL_TABLET | Freq: Every day | ORAL | 1 refills | Status: DC

## 2019-11-24 MED ORDER — LEVOTHYROXINE SODIUM 175 MCG PO TABS: 175.0000 ug | ORAL_TABLET | Freq: Every day | ORAL | 1 refills | Status: DC

## 2019-11-24 MED ORDER — PANTOPRAZOLE SODIUM 40 MG PO TBEC: 40.0000 mg | DELAYED_RELEASE_TABLET | Freq: Every day | ORAL | 2 refills | Status: DC

## 2019-11-24 MED ORDER — AMLODIPINE BESYLATE 5 MG PO TABS: 5.0000 mg | ORAL_TABLET | Freq: Every day | ORAL | 3 refills | Status: DC

## 2019-11-24 MED ORDER — LOSARTAN POTASSIUM 50 MG PO TABS: 50.0000 mg | ORAL_TABLET | Freq: Every day | ORAL | 1 refills | Status: DC

## 2019-11-24 NOTE — Progress Notes (Signed)
Patient ID: Darren [name], male    DOB: 26-Feb-1961  MRN: [id number]  CC: Hypertension   Subjective: Darren [name] is a 59 y.o. male who presents for chronic ds management His concerns today include:  Pt with hx of GERD, hypothyroid,HTN,HL,chronic LBP and Barrett's esophagus. Hx of COVID infection  HYPERTENSION Currently taking: see medication list.  He is on Cozaar.  He has taken medicine already for today Med Adherence: [x]  Yes    []  No Medication side effects: []  Yes    [x]  No Adherence with salt restriction: [x]  Yes    []  No Home Monitoring?: []  Yes    [x]  No Monitoring Frequency: []  Yes    []  No Home BP results range: []  Yes    []  No SOB? []  Yes    [x]  No Chest Pain?: []  Yes    [x]  No Leg swelling?: []  Yes    [x]  No Headaches?: []  Yes    [x]  No Dizziness? []  Yes    [x]  No Comments:    Nose bleed occasional.  Last episode last mth  HL:  Taking and tolerating atorvastatin  GERD: Requesting refill on his medication.  Complains of having a knot at back of neck.  Just noticed it 3 mths ago.  Increased in size. Bothers him when he turns his neck.  Hypothyroid: Compliant with levothyroxine.  He denies any feeling hot or cold all the time.  No palpitations.  No diarrhea or constipation Patient Active Problem List   Diagnosis Date Noted  . Epistaxis 11/04/2017  . Class 1 obesity due to excess calories without serious comorbidity with body mass index (BMI) of 31.0 to 31.9 in adult 11/04/2017  . Bilateral wrist pain 04/22/2017  . Disorder of left sacroiliac joint 12/21/2016  . Spondylosis without myelopathy or radiculopathy, lumbar region 12/21/2016  . Lumbar degenerative disc disease 12/21/2016  . COPD (chronic obstructive pulmonary disease) with emphysema ([place]) 07/31/2016  . HTN (hypertension) 07/31/2016  . Dilation of thoracic aorta ([place]) 07/31/2016  . Esophageal dysphagia 07/09/2016  . Chronic midline low back pain 07/09/2016  . Sensorineural hearing loss (SNHL),  bilateral 03/30/2016  . Vitamin D deficiency 04/06/2014  . Hypothyroidism 10/31/2013  . GERD (gastroesophageal reflux disease) 11/25/2011  . Barrett esophagus 09/29/1999     Current Outpatient Medications on File Prior to Visit  Medication Sig Dispense Refill  . amoxicillin-clavulanate (AUGMENTIN) 875-125 MG tablet Take 1 tablet by mouth 2 (two) times daily. 20 tablet 0  . atorvastatin (LIPITOR) 10 MG tablet Take 1 tablet (10 mg total) by mouth daily. 90 tablet 1  . diclofenac sodium (VOLTAREN) 1 % GEL Apply 4 g topically 4 (four) times daily. 150 g 5  . levothyroxine (SYNTHROID) 175 MCG tablet Take 1 tablet (175 mcg total) by mouth daily. 90 tablet 1  . losartan (COZAAR) 50 MG tablet Take 1 tablet (50 mg total) by mouth daily. 90 tablet 1  . methocarbamol (ROBAXIN) 500 MG tablet Take 1 tablet (500 mg total) by mouth every 8 (eight) hours as needed for muscle spasms. (Patient not taking: Reported on 08/02/2019) 90 tablet 0  . pantoprazole (PROTONIX) 40 MG tablet Take 1 tablet (40 mg total) by mouth daily. 90 tablet 2   No current facility-administered medications on file prior to visit.    No Known Allergies  Social History   Socioeconomic History  . Marital status: Single    Spouse name: Not on file  . Number of children: 2  . Years of  education: Not on file  . Highest education level: Not on file  Occupational History  . Not on file  Tobacco Use  . Smoking status: Former Smoker    Packs/day: 1.00    Years: 26.00    Pack years: 26.00    Types: Cigarettes    Quit date: 09/15/2004    Years since quitting: 15.2  . Smokeless tobacco: Never Used  . Tobacco comment: Quit in 2005  Substance and Sexual Activity  . Alcohol use: Yes    Comment: ON WEEKENDS  . Drug use: No  . Sexual activity: Not Currently    Birth control/protection: None  Other Topics Concern  . Not on file  Social History Narrative   Lives alone.     Social Determinants of Health   Financial Resource  Strain:   . Difficulty of Paying Living Expenses: Not on file  Food Insecurity:   . Worried About Charity fundraiser in the Last Year: Not on file  . Ran Out of Food in the Last Year: Not on file  Transportation Needs:   . Lack of Transportation (Medical): Not on file  . Lack of Transportation (Non-Medical): Not on file  Physical Activity:   . Days of Exercise per Week: Not on file  . Minutes of Exercise per Session: Not on file  Stress:   . Feeling of Stress : Not on file  Social Connections:   . Frequency of Communication with Friends and Family: Not on file  . Frequency of Social Gatherings with Friends and Family: Not on file  . Attends Religious Services: Not on file  . Active Member of Clubs or Organizations: Not on file  . Attends Archivist Meetings: Not on file  . Marital Status: Not on file  Intimate Partner Violence:   . Fear of Current or Ex-Partner: Not on file  . Emotionally Abused: Not on file  . Physically Abused: Not on file  . Sexually Abused: Not on file    Family History  Problem Relation Age of Onset  . Cancer Father        lung  . Stroke Father   . Heart attack Father 39  . Lung cancer Mother   . Thyroid disease Sister   . Colon cancer Neg Hx   . Esophageal cancer Neg Hx     Past Surgical History:  Procedure Laterality Date  . BIOPSY  09/18/2016   Procedure: BIOPSY;  Surgeon: Daneil Dolin, MD;  [facility];  Service: Endoscopy;;  biopsy of distal esophagus  . ESOPHAGOGASTRODUODENOSCOPY  2001   barrett's per path  . ESOPHAGOGASTRODUODENOSCOPY (EGD) WITH PROPOFOL N/A 09/18/2016   Procedure: ESOPHAGOGASTRODUODENOSCOPY (EGD) WITH PROPOFOL;  Surgeon: Daneil Dolin, MD;  [facility];  Service: Endoscopy;  Laterality: N/A;  1200  . [place]  . MULTIPLE EXTRACTIONS WITH ALVEOLOPLASTY  11/30/2011   Procedure: MULTIPLE EXTRACION WITH ALVEOLOPLASTY;  Surgeon: Gae Bon, DDS;  Location: [facility];   Service: Oral Surgery;  Laterality: Bilateral;    ROS: Review of Systems Negative except as stated above  PHYSICAL EXAM: BP (!) 142/94   Pulse 69   Temp 98.4 F (36.9 C)   Resp 16   Wt 180 lb 12.8 oz (82 kg)   SpO2 95%   BMI 32.03 kg/m   Wt Readings from Last 3 Encounters:  11/24/19 180 lb 12.8 oz (82 kg)  11/02/18 177 lb (80.3 kg)  09/29/18 183 lb 9.6 oz (83.3 kg)  BP 140/90  Physical Exam General appearance - alert, well appearing, and in no distress Mental status - normal mood, behavior, speech, dress, motor activity, and thought processes Neck -5 cm soft mobile soft tissue mass at the back of the neck.  He has good range of motion of the neck Chest - clear to auscultation, no wheezes, rales or rhonchi, symmetric air entry Heart - normal rate, regular rhythm, normal S1, S2, no murmurs, rubs, clicks or gallops Extremities - peripheral pulses normal, no pedal edema, no clubbing or cyanosis  CMP Latest Ref Rng & Units 10/17/2018 10/05/2017 11/23/2016  Glucose 65 - 99 mg/dL 110(H) 101(H) 99  BUN 6 - 24 mg/dL 14 16 11   Creatinine 0.76 - 1.27 mg/dL 0.90 0.82 0.73  Sodium 134 - 144 mmol/L 139 138 136  Potassium 3.5 - 5.2 mmol/L 4.9 3.9 4.1  Chloride 96 - 106 mmol/L 100 100 104  CO2 20 - 29 mmol/L 23 22 26   Calcium 8.7 - 10.2 mg/dL 9.5 9.4 9.3  Total Protein 6.0 - 8.5 g/dL 6.7 6.8 6.8  Total Bilirubin 0.0 - 1.2 mg/dL 0.4 0.7 0.6  Alkaline Phos 39 - 117 IU/L 82 69 51  AST 0 - 40 IU/L 28 25 38  ALT 0 - 44 IU/L 48(H) 34 61   Lipid Panel     Component Value Date/Time   CHOL 224 (H) 10/17/2018 1213   TRIG 268 (H) 10/17/2018 1213   HDL 45 10/17/2018 1213   CHOLHDL 5.0 10/17/2018 1213   CHOLHDL 4.1 10/31/2013 1633   VLDL 34 10/31/2013 1633   LDLCALC 125 (H) 10/17/2018 1213    CBC    Component Value Date/Time   WBC 10.7 10/17/2018 1213   WBC 9.9 11/23/2016 1331   RBC 5.36 10/17/2018 1213   RBC 5.14 11/23/2016 1331   HGB 14.8 10/17/2018 1213   HCT 43.8 10/17/2018 1213     PLT 191 10/17/2018 1213   MCV 82 10/17/2018 1213   MCH 27.6 10/17/2018 1213   MCH 27.8 11/23/2016 1331   MCHC 33.8 10/17/2018 1213   MCHC 34.7 11/23/2016 1331   RDW 13.1 10/17/2018 1213   LYMPHSABS 5.3 (H) 11/23/2016 1331   MONOABS 0.7 11/23/2016 1331   EOSABS 0.3 11/23/2016 1331   BASOSABS 0.0 11/23/2016 1331    ASSESSMENT AND PLAN: 1. Essential hypertension Not at goal.  Continue Cozaar.  Add low-dose Norvasc.  Continue DASH diet - CBC; Future - Comprehensive metabolic panel; Future - losartan (COZAAR) 50 MG tablet; Take 1 tablet (50 mg total) by mouth daily.  Dispense: 90 tablet; Refill: 1 - amLODipine (NORVASC) 5 MG tablet; Take 1 tablet (5 mg total) by mouth daily.  Dispense: 90 tablet; Refill: 3  2. Hypothyroidism, unspecified type Continue levothyroxine - TSH; Future - levothyroxine (SYNTHROID) 175 MCG tablet; Take 1 tablet (175 mcg total) by mouth daily.  Dispense: 90 tablet; Refill: 1  3. Mixed hyperlipidemia - Lipid panel; Future - atorvastatin (LIPITOR) 10 MG tablet; Take 1 tablet (10 mg total) by mouth daily.  Dispense: 90 tablet; Refill: 1  4. Lipoma of neck This feels like a lipoma.  Since he feels it has increased in size and bothers him with movement, we can refer to a general surgeon but he has to reapply for the [employer] discount.  Once approved he will let me know so that I can submit the referral  5. Gastroesophageal reflux disease, unspecified whether esophagitis present Refill given  on Protonix - pantoprazole (PROTONIX) 40 MG tablet; Take 1 tablet (40 mg total) by mouth daily.  Dispense: 90 tablet; Refill: 2     Patient was given the opportunity to ask questions.  Patient verbalized understanding of the plan and was able to repeat key elements of the plan.   No orders of the defined types were placed in this encounter.    Requested Prescriptions    No prescriptions requested or ordered in this encounter    No follow-ups on file.  Karle Plumber, MD, FACP

## 2019-11-27 MED FILL — PANTOPRAZOLE SOD DR 40 MG T: 40 | 30 days supply | Qty: 30 | Fill #0

## 2019-11-27 MED FILL — AMLODIPINE BESYLATE 5 MG TA: 5 | 30 days supply | Qty: 30 | Fill #0

## 2019-11-27 MED FILL — DICLOFENAC SODIUM 1 % GEL: 1 | 6 days supply | Qty: 100 | Fill #3

## 2019-11-27 MED FILL — LOSARTAN POTASSIUM 50 MG TA: 50 | 30 days supply | Qty: 30 | Fill #0

## 2019-11-27 MED FILL — ?LEVOTHYROXINE 175 MCG TAB: 175 | 30 days supply | Qty: 30 | Fill #0

## 2019-11-27 MED FILL — ?ATORVASTATIN CALCIUM 10MG: 10 | 30 days supply | Qty: 30 | Fill #0

## 2019-12-06 ENCOUNTER — Encounter: Payer: Self-pay | Admitting: Internal Medicine

## 2019-12-06 ENCOUNTER — Ambulatory Visit: Payer: Self-pay | Attending: Internal Medicine

## 2019-12-06 ENCOUNTER — Other Ambulatory Visit: Payer: Self-pay

## 2019-12-06 DIAGNOSIS — E782 Mixed hyperlipidemia: Secondary | ICD-10-CM

## 2019-12-06 DIAGNOSIS — I10 Essential (primary) hypertension: Secondary | ICD-10-CM

## 2019-12-06 DIAGNOSIS — E039 Hypothyroidism, unspecified: Secondary | ICD-10-CM

## 2019-12-07 LAB — CBC
Hematocrit: 42.8 % (ref 37.5–51.0)
Hemoglobin: 14.7 g/dL (ref 13.0–17.7)
MCH: 28.1 pg (ref 26.6–33.0)
MCHC: 34.3 g/dL (ref 31.5–35.7)
MCV: 82 fL (ref 79–97)
Platelets: 173 10*3/uL (ref 150–450)
RBC: 5.23 x10E6/uL (ref 4.14–5.80)
RDW: 13.1 % (ref 11.6–15.4)
WBC: 15.4 10*3/uL — ABNORMAL HIGH (ref 3.4–10.8)

## 2019-12-07 LAB — COMPREHENSIVE METABOLIC PANEL
ALT: 38 IU/L (ref 0–44)
AST: 28 IU/L (ref 0–40)
Albumin/Globulin Ratio: 2.1 (ref 1.2–2.2)
Albumin: 4.5 g/dL (ref 3.8–4.9)
Alkaline Phosphatase: 81 IU/L (ref 39–117)
BUN/Creatinine Ratio: 29 — ABNORMAL HIGH (ref 9–20)
BUN: 20 mg/dL (ref 6–24)
Bilirubin Total: 0.5 mg/dL (ref 0.0–1.2)
CO2: 20 mmol/L (ref 20–29)
Calcium: 9.4 mg/dL (ref 8.7–10.2)
Chloride: 104 mmol/L (ref 96–106)
Creatinine, Ser: 0.7 mg/dL — ABNORMAL LOW (ref 0.76–1.27)
GFR calc Af Amer: 120 mL/min/{1.73_m2} (ref >59)
GFR calc non Af Amer: 104 mL/min/{1.73_m2} (ref >59)
Globulin, Total: 2.1 g/dL (ref 1.5–4.5)
Glucose: 113 mg/dL — ABNORMAL HIGH (ref 65–99)
Potassium: 4.4 mmol/L (ref 3.5–5.2)
Sodium: 139 mmol/L (ref 134–144)
Total Protein: 6.6 g/dL (ref 6.0–8.5)

## 2019-12-07 LAB — LIPID PANEL
Chol/HDL Ratio: 3.1 ratio (ref 0.0–5.0)
Cholesterol, Total: 148 mg/dL (ref 100–199)
HDL: 47 mg/dL (ref >39)
LDL Chol Calc (NIH): 80 mg/dL (ref 0–99)
Triglycerides: 117 mg/dL (ref 0–149)
VLDL Cholesterol Cal: 21 mg/dL (ref 5–40)

## 2019-12-07 LAB — TSH: TSH: 0.645 u[IU]/mL (ref 0.450–4.500)

## 2019-12-08 ENCOUNTER — Telehealth: Payer: Self-pay

## 2019-12-08 NOTE — Telephone Encounter (Signed)
Contacted pt to go over lab results pt didn't answer left a detailed vm informing pt of results and if he has any questions or concerns to give a call  °

## 2019-12-13 ENCOUNTER — Telehealth: Payer: Self-pay | Admitting: Internal Medicine

## 2019-12-13 ENCOUNTER — Other Ambulatory Visit: Payer: Self-pay

## 2019-12-13 ENCOUNTER — Ambulatory Visit: Payer: Self-pay | Attending: Internal Medicine

## 2019-12-13 ENCOUNTER — Ambulatory Visit: Payer: Self-pay

## 2019-12-13 NOTE — Telephone Encounter (Signed)
Darren [name] called on his work break stating he was given 4:10 for his financial appointment. I clarified and he hung up the call. His financial appointment is 03/17/2021at 3:30pm. [name] stated she would call back patient

## 2019-12-26 MED FILL — ?ATORVASTATIN CALCIUM 10MG: 10 | 30 days supply | Qty: 30 | Fill #1

## 2019-12-26 MED FILL — AMLODIPINE BESYLATE 5 MG TA: 5 | 30 days supply | Qty: 30 | Fill #1

## 2019-12-26 MED FILL — LOSARTAN POTASSIUM 50 MG TA: 50 | 30 days supply | Qty: 30 | Fill #1

## 2019-12-26 MED FILL — DICLOFENAC SODIUM 1% GEL: 1 | 6 days supply | Qty: 100 | Fill #4

## 2019-12-26 MED FILL — ?PANTOPRAZOLE SO DR 40MG TA: 40 | 30 days supply | Qty: 30 | Fill #1

## 2019-12-26 MED FILL — ?LEVOTHYROXINE 175 MCG TAB: 175 | 30 days supply | Qty: 30 | Fill #1

## 2020-01-15 ENCOUNTER — Ambulatory Visit: Payer: Self-pay

## 2020-01-18 ENCOUNTER — Encounter (HOSPITAL_COMMUNITY): Payer: Self-pay

## 2020-01-18 ENCOUNTER — Other Ambulatory Visit: Payer: Self-pay

## 2020-01-18 ENCOUNTER — Ambulatory Visit (HOSPITAL_COMMUNITY)
Admission: EM | Admit: 2020-01-18 | Discharge: 2020-01-18 | Disposition: A | Discharge status: Home / Self Care | Payer: HRSA Program | Attending: Family Medicine | Admitting: Family Medicine

## 2020-01-18 DIAGNOSIS — Z20822 Contact with and (suspected) exposure to covid-19: Secondary | ICD-10-CM | POA: Diagnosis not present

## 2020-01-18 DIAGNOSIS — Z801 Family history of malignant neoplasm of trachea, bronchus and lung: Secondary | ICD-10-CM | POA: Insufficient documentation

## 2020-01-18 DIAGNOSIS — E039 Hypothyroidism, unspecified: Secondary | ICD-10-CM | POA: Insufficient documentation

## 2020-01-18 DIAGNOSIS — K219 Gastro-esophageal reflux disease without esophagitis: Secondary | ICD-10-CM | POA: Diagnosis not present

## 2020-01-18 DIAGNOSIS — R197 Diarrhea, unspecified: Secondary | ICD-10-CM | POA: Insufficient documentation

## 2020-01-18 DIAGNOSIS — Z8249 Family history of ischemic heart disease and other diseases of the circulatory system: Secondary | ICD-10-CM | POA: Insufficient documentation

## 2020-01-18 DIAGNOSIS — R112 Nausea with vomiting, unspecified: Secondary | ICD-10-CM | POA: Insufficient documentation

## 2020-01-18 DIAGNOSIS — I10 Essential (primary) hypertension: Secondary | ICD-10-CM | POA: Diagnosis not present

## 2020-01-18 DIAGNOSIS — J449 Chronic obstructive pulmonary disease, unspecified: Secondary | ICD-10-CM | POA: Insufficient documentation

## 2020-01-18 DIAGNOSIS — Z79890 Hormone replacement therapy (postmenopausal): Secondary | ICD-10-CM | POA: Diagnosis not present

## 2020-01-18 DIAGNOSIS — Z79899 Other long term (current) drug therapy: Secondary | ICD-10-CM | POA: Diagnosis not present

## 2020-01-18 DIAGNOSIS — Z823 Family history of stroke: Secondary | ICD-10-CM | POA: Insufficient documentation

## 2020-01-18 DIAGNOSIS — Z87891 Personal history of nicotine dependence: Secondary | ICD-10-CM | POA: Insufficient documentation

## 2020-01-18 DIAGNOSIS — H903 Sensorineural hearing loss, bilateral: Secondary | ICD-10-CM | POA: Diagnosis not present

## 2020-01-18 DIAGNOSIS — Z8349 Family history of other endocrine, nutritional and metabolic diseases: Secondary | ICD-10-CM | POA: Insufficient documentation

## 2020-01-18 MED ORDER — ONDANSETRON 4 MG PO TBDP
ORAL_TABLET | ORAL | Status: AC
  Filled 2020-01-18: qty 2

## 2020-01-18 MED ORDER — ONDANSETRON 4 MG PO TBDP
8.0000 mg | ORAL_TABLET | Freq: Once | ORAL | Status: AC
  Administered 2020-01-18: 8 mg via ORAL

## 2020-01-18 MED ORDER — ONDANSETRON HCL 8 MG PO TABS: 8.0000 mg | ORAL_TABLET | Freq: Three times a day (TID) | ORAL | 0 refills | Status: DC | PRN

## 2020-01-18 MED FILL — ?ONDANSETON HCL 8MG TAB: 8 | 7 days supply | Qty: 20 | Fill #0

## 2020-01-18 NOTE — Discharge Instructions (Signed)
Drink plenty of water and gatorade Avoid highly spiced and fried foods Get imodium or pepto bismol if needed for diarrhea GO TO ER if you vomit blood or coffee ground material Take Tylenol for pain or fever You may take over-the-counter cough and cold medicines as needed You must quarantine at home until your test result is available You can check for your test result in MyChart CALL for questions

## 2020-01-18 NOTE — ED Provider Notes (Signed)
Darren [name]    CSN: [id number] Arrival date & time: 01/18/20  1132      History   Chief Complaint Chief Complaint  Patient presents with  . Diarrhea    HPI Darren [name] is a 59 y.o. male.   HPI  Patient had coronavirus just over 3 months ago He had nausea vomiting diarrhea today at work He is here requesting a work note Is employer is requiring a new Covid test He is also requesting a note so he does not have to wear a mask because he has COPD.  I explained to him that I believe he needs to wear a mask even though he has COPD, and expressed sympathy that it is uncomfortable for him  Past Medical History:  Diagnosis Date  . Arthritis    per Medical clearance form.  . Ascending aortic aneurysm ([place]) 06/2016   4.2 cm  . Barrett esophagus 2001   path report in EPIC 2001 c/w Barrett's. endoscopy report unavailable.  Marland Kitchen COPD (chronic obstructive pulmonary disease) ([place])    per medical clearance form.  . Depression    per Medical Clearance form(09/18/11)  . GERD (gastroesophageal reflux disease)    per Medical clearance form.  . Hypertension    Per Medical Clearance form.  . Hypothyroidism    per Medical Clearance form.    Patient Active Problem List   Diagnosis Date Noted  . Lipoma of neck 11/24/2019  . Epistaxis 11/04/2017  . Class 1 obesity due to excess calories without serious comorbidity with body mass index (BMI) of 31.0 to 31.9 in adult 11/04/2017  . Bilateral wrist pain 04/22/2017  . Disorder of left sacroiliac joint 12/21/2016  . Spondylosis without myelopathy or radiculopathy, lumbar region 12/21/2016  . Lumbar degenerative disc disease 12/21/2016  . COPD (chronic obstructive pulmonary disease) with emphysema ([place]) 07/31/2016  . HTN (hypertension) 07/31/2016  . Dilation of thoracic aorta ([place]) 07/31/2016  . Esophageal dysphagia 07/09/2016  . Chronic midline low back pain 07/09/2016  . Sensorineural hearing loss (SNHL), bilateral 03/30/2016    . Vitamin D deficiency 04/06/2014  . Hypothyroidism 10/31/2013  . GERD (gastroesophageal reflux disease) 11/25/2011  . Barrett esophagus 09/29/1999    Past Surgical History:  Procedure Laterality Date  . BIOPSY  09/18/2016   Procedure: BIOPSY;  Surgeon: Daneil Dolin, MD;  [facility];  Service: Endoscopy;;  biopsy of distal esophagus  . ESOPHAGOGASTRODUODENOSCOPY  2001   barrett's per path  . ESOPHAGOGASTRODUODENOSCOPY (EGD) WITH PROPOFOL N/A 09/18/2016   Procedure: ESOPHAGOGASTRODUODENOSCOPY (EGD) WITH PROPOFOL;  Surgeon: Daneil Dolin, MD;  [facility];  Service: Endoscopy;  Laterality: N/A;  1200  . [place]  . MULTIPLE EXTRACTIONS WITH ALVEOLOPLASTY  11/30/2011   Procedure: MULTIPLE EXTRACION WITH ALVEOLOPLASTY;  Surgeon: Gae Bon, DDS;  Location: [facility];  Service: Oral Surgery;  Laterality: Bilateral;       Home Medications    Prior to Admission medications   Medication Sig Start Date End Date Taking? Authorizing Provider  amLODipine (NORVASC) 5 MG tablet Take 1 tablet (5 mg total) by mouth daily. 11/24/19   Ladell Pier, MD  atorvastatin (LIPITOR) 10 MG tablet Take 1 tablet (10 mg total) by mouth daily. 11/24/19   Ladell Pier, MD  diclofenac sodium (VOLTAREN) 1 % GEL Apply 4 g topically 4 (four) times daily. 07/27/19   Fulp, Cammie, MD  levothyroxine (SYNTHROID) 175 MCG tablet Take 1 tablet (  175 mcg total) by mouth daily. 11/24/19   Ladell Pier, MD  losartan (COZAAR) 50 MG tablet Take 1 tablet (50 mg total) by mouth daily. 11/24/19   Ladell Pier, MD  ondansetron (ZOFRAN) 8 MG tablet Take 1 tablet (8 mg total) by mouth every 8 (eight) hours as needed for nausea or vomiting. 01/18/20   Raylene Everts, MD  pantoprazole (PROTONIX) 40 MG tablet Take 1 tablet (40 mg total) by mouth daily. 11/24/19   Ladell Pier, MD    Family History Family History  Problem Relation Age of Onset  . Cancer Father         lung  . Stroke Father   . Heart attack Father 49  . Lung cancer Mother   . Thyroid disease Sister   . Colon cancer Neg Hx   . Esophageal cancer Neg Hx     Social History Social History   Tobacco Use  . Smoking status: Former Smoker    Packs/day: 1.00    Years: 26.00    Pack years: 26.00    Types: Cigarettes    Quit date: 09/15/2004    Years since quitting: 15.3  . Smokeless tobacco: Never Used  . Tobacco comment: Quit in 2005  Substance Use Topics  . Alcohol use: Yes    Comment: ON WEEKENDS  . Drug use: No     Allergies   Patient has no known allergies.   Review of Systems Review of Systems  Respiratory: Positive for shortness of breath.        Chronic    Gastrointestinal: Positive for diarrhea, nausea and vomiting.     Physical Exam Triage Vital Signs ED Triage Vitals  Enc Vitals Group     BP 01/18/20 1211 124/73     Pulse Rate 01/18/20 1211 88     Resp 01/18/20 1211 18     Temp 01/18/20 1211 98.2 F (36.8 C)     Temp Source 01/18/20 1211 Oral     SpO2 01/18/20 1211 98 %     Weight 01/18/20 1212 180 lb (81.6 kg)     Height --      Head Circumference --      Peak Flow --      Pain Score 01/18/20 1212 5     Pain Loc --      Pain Edu? --      Excl. in [place]? --    No data found.  Updated Vital Signs BP 124/73 (BP Location: Right Arm)   Pulse 88   Temp 98.2 F (36.8 C) (Oral)   Resp 18   Wt 81.6 kg   SpO2 98%   BMI 31.89 kg/m  Physical Exam Constitutional:      General: He is not in acute distress.    Appearance: Normal appearance. He is well-developed. He is ill-appearing.     Comments: Protuberant abdomen  HENT:     Head: Normocephalic and atraumatic.     Nose:     Comments: Mask in place Eyes:     Conjunctiva/sclera: Conjunctivae normal.     Pupils: Pupils are equal, round, and reactive to light.  Cardiovascular:     Rate and Rhythm: Normal rate and regular rhythm.     Heart sounds: Normal heart sounds.  Pulmonary:      Effort: Pulmonary effort is normal. No respiratory distress.     Comments: Decreased breath sounds Abdominal:     General: There is no distension.  Palpations: Abdomen is soft.     Tenderness: There is no abdominal tenderness.  Musculoskeletal:        General: Normal range of motion.     Cervical back: Normal range of motion.  Skin:    General: Skin is warm and dry.  Neurological:     Mental Status: He is alert.  Psychiatric:        Mood and Affect: Mood normal.        Behavior: Behavior normal.      UC Treatments / Results  Labs (all labs ordered are listed, but only abnormal results are displayed) Labs Reviewed  SARS CORONAVIRUS 2 (TAT 6-24 HRS)    EKG   Radiology No results found.  Procedures Procedures (including critical care time)  Medications Ordered in UC Medications  ondansetron (ZOFRAN-ODT) disintegrating tablet 8 mg (8 mg Oral Given 01/18/20 1331)    Initial Impression / Assessment and Plan / UC Course  I have reviewed the triage vital signs and the nursing notes.  Pertinent labs & imaging results that were available during my care of the patient were reviewed by me and considered in my medical decision making (see chart for details).     Will not give note to avoid mask wearing Discussed nausea vomiting or diarrhea Likely gastroenteritis Possible disagreeable food Repeated Covid test although I think this is unlikely.  This is due to his company's request.  It has been 90 days since his coronavirus infection Final Clinical Impressions(s) / UC Diagnoses   Final diagnoses:  Nausea vomiting and diarrhea     Discharge Instructions     Drink plenty of water and gatorade Avoid highly spiced and fried foods Get imodium or pepto bismol if needed for diarrhea GO TO ER if you vomit blood or coffee ground material Take Tylenol for pain or fever You may take over-the-counter cough and cold medicines as needed You must quarantine at home until your  test result is available You can check for your test result in MyChart CALL for questions    ED Prescriptions    Medication Sig Dispense Auth. Provider   ondansetron (ZOFRAN) 8 MG tablet Take 1 tablet (8 mg total) by mouth every 8 (eight) hours as needed for nausea or vomiting. 20 tablet Raylene Everts, MD     PDMP not reviewed this encounter.   Raylene Everts, MD 01/18/20 2120

## 2020-01-18 NOTE — ED Triage Notes (Signed)
Pt started having nausea and vomiting on the job this morning. Pt states he has diarrhea as well it started this morning.

## 2020-01-19 ENCOUNTER — Telehealth: Payer: Self-pay

## 2020-01-19 ENCOUNTER — Ambulatory Visit: Payer: Self-pay

## 2020-01-19 LAB — SARS CORONAVIRUS 2 (TAT 6-24 HRS): SARS Coronavirus 2: NEGATIVE

## 2020-01-19 NOTE — Telephone Encounter (Signed)
Will forward to pcp

## 2020-01-19 NOTE — Telephone Encounter (Signed)
Patient went to get COVID test done due to job sending him home for having symptoms. Test came back negative but patient states that he needs a letter stating that he has COPD and can wear the face shield and not the mask due to him not be able to breath.  States that she needs the letter by Monday so that he can go to work.

## 2020-01-26 MED FILL — DICLOFENAC SODIUM 1% GEL: 1 | 6 days supply | Qty: 100 | Fill #5

## 2020-01-26 MED FILL — ?AMLODIPINE BESYLATE 5MG TA: 5 | 30 days supply | Qty: 30 | Fill #2

## 2020-01-26 MED FILL — ?LEVOTHYROXINE SODIUM 175 M: 175 | 30 days supply | Qty: 30 | Fill #2

## 2020-01-26 MED FILL — ?ATORVASTATIN CALCIUM 10MG: 10 | 30 days supply | Qty: 30 | Fill #2

## 2020-01-26 MED FILL — ?PANTOPRAZOLE SO DR 40MG TA: 40 | 30 days supply | Qty: 30 | Fill #2

## 2020-01-26 MED FILL — LOSARTAN POTASSIUM 50 MG TA: 50 | 30 days supply | Qty: 30 | Fill #2

## 2020-02-28 MED FILL — ?LEVOTHYROXINE SODIUM 175 M: 175 | 30 days supply | Qty: 30 | Fill #3

## 2020-02-28 MED FILL — ?AMLODIPINE BESYLATE 5MG TA: 5 | 30 days supply | Qty: 30 | Fill #3

## 2020-02-28 MED FILL — ?PANTOPRAZOLE SO DR 40MG TA: 40 | 30 days supply | Qty: 30 | Fill #3

## 2020-02-28 MED FILL — LOSARTAN POTASSIUM 50 MG TA: 50 | 30 days supply | Qty: 30 | Fill #3

## 2020-02-28 MED FILL — ?ATORVASTATIN 10 MG TABLET: 10 | 30 days supply | Qty: 30 | Fill #3

## 2020-03-26 MED FILL — ?AMLODIPINE BESYLATE 5MG TA: 5 | 30 days supply | Qty: 30 | Fill #4

## 2020-03-26 MED FILL — LOSARTAN POTASSIUM 50 MG TA: 50 | 30 days supply | Qty: 30 | Fill #4

## 2020-03-26 MED FILL — ?PANTOPRAZOLE SO DR 40MG TA: 40 | 30 days supply | Qty: 30 | Fill #4

## 2020-03-26 MED FILL — ?LEVOTHYROXINE SODIUM 175 M: 175 | 30 days supply | Qty: 30 | Fill #4

## 2020-03-26 MED FILL — DICLOFENAC SODIUM 1% GEL: 1 | 6 days supply | Qty: 100 | Fill #6

## 2020-03-26 MED FILL — ?ATORVASTATIN 10 MG TABLET: 10 | 30 days supply | Qty: 30 | Fill #4

## 2020-03-28 ENCOUNTER — Encounter: Payer: Self-pay | Admitting: Family

## 2020-03-28 ENCOUNTER — Ambulatory Visit: Payer: Self-pay | Attending: Internal Medicine | Admitting: Family

## 2020-03-28 ENCOUNTER — Other Ambulatory Visit: Payer: Self-pay

## 2020-03-28 ENCOUNTER — Other Ambulatory Visit: Payer: Self-pay | Admitting: Family

## 2020-03-28 VITALS — BP 135/80 | HR 64 | Temp 97.9°F | Resp 16 | Wt 182.0 lb

## 2020-03-28 DIAGNOSIS — D170 Benign lipomatous neoplasm of skin and subcutaneous tissue of head, face and neck: Secondary | ICD-10-CM

## 2020-03-28 DIAGNOSIS — Z131 Encounter for screening for diabetes mellitus: Secondary | ICD-10-CM

## 2020-03-28 DIAGNOSIS — I10 Essential (primary) hypertension: Secondary | ICD-10-CM

## 2020-03-28 DIAGNOSIS — K219 Gastro-esophageal reflux disease without esophagitis: Secondary | ICD-10-CM

## 2020-03-28 DIAGNOSIS — E039 Hypothyroidism, unspecified: Secondary | ICD-10-CM

## 2020-03-28 DIAGNOSIS — E782 Mixed hyperlipidemia: Secondary | ICD-10-CM

## 2020-03-28 MED ORDER — LOSARTAN POTASSIUM 50 MG PO TABS: 50.0000 mg | ORAL_TABLET | Freq: Every day | ORAL | 2 refills | Status: DC

## 2020-03-28 MED ORDER — PANTOPRAZOLE SODIUM 40 MG PO TBEC: 40.0000 mg | DELAYED_RELEASE_TABLET | Freq: Every day | ORAL | 2 refills | Status: DC

## 2020-03-28 MED ORDER — LEVOTHYROXINE SODIUM 175 MCG PO TABS: 175.0000 ug | ORAL_TABLET | Freq: Every day | ORAL | 2 refills | Status: DC

## 2020-03-28 MED ORDER — AMLODIPINE BESYLATE 5 MG PO TABS: 5.0000 mg | ORAL_TABLET | Freq: Every day | ORAL | 2 refills | Status: DC

## 2020-03-28 MED ORDER — ATORVASTATIN CALCIUM 10 MG PO TABS: 10.0000 mg | ORAL_TABLET | Freq: Every day | ORAL | 2 refills | Status: DC

## 2020-03-28 NOTE — Patient Instructions (Addendum)
Continue Amlodipine and Losartan for blood pressure.   Continue Synthroid for hypothyroidism.   Continue Atorvastatin for cholesterol.   Continue Protonix for acid reflux.   Referral to general surgery for removal of lipoma of neck.   Lab today.   Follow-up with primary physician in 3 months or sooner if needed.  Hypertension, Adult Hypertension is another name for high blood pressure. High blood pressure forces your heart to work harder to pump blood. This can cause problems over time. There are two numbers in a blood pressure reading. There is a top number (systolic) over a bottom number (diastolic). It is best to have a blood pressure that is below 120/80. Healthy choices can help lower your blood pressure, or you may need medicine to help lower it. What are the causes? The cause of this condition is not known. Some conditions may be related to high blood pressure. What increases the risk?  Smoking.  Having type 2 diabetes mellitus, high cholesterol, or both.  Not getting enough exercise or physical activity.  Being overweight.  Having too much fat, sugar, calories, or salt (sodium) in your diet.  Drinking too much alcohol.  Having long-term (chronic) kidney disease.  Having a family history of high blood pressure.  Age. Risk increases with age.  Race. You may be at higher risk if you are African American.  Gender. Men are at higher risk than women before age 16. After age 65, women are at higher risk than men.  Having obstructive sleep apnea.  Stress. What are the signs or symptoms?  High blood pressure may not cause symptoms. Very high blood pressure (hypertensive crisis) may cause: ? Headache. ? Feelings of worry or nervousness (anxiety). ? Shortness of breath. ? Nosebleed. ? A feeling of being sick to your stomach (nausea). ? Throwing up (vomiting). ? Changes in how you see. ? Very bad chest pain. ? Seizures. How is this treated?  This condition is  treated by making healthy lifestyle changes, such as: ? Eating healthy foods. ? Exercising more. ? Drinking less alcohol.  Your health care provider may prescribe medicine if lifestyle changes are not enough to get your blood pressure under control, and if: ? Your top number is above 130. ? Your bottom number is above 80.  Your personal target blood pressure may vary. Follow these instructions at home: Eating and drinking   If told, follow the DASH eating plan. To follow this plan: ? Fill one half of your plate at each meal with fruits and vegetables. ? Fill one fourth of your plate at each meal with whole grains. Whole grains include whole-wheat pasta, brown rice, and whole-grain bread. ? Eat or drink low-fat dairy products, such as skim milk or low-fat yogurt. ? Fill one fourth of your plate at each meal with low-fat (lean) proteins. Low-fat proteins include fish, chicken without skin, eggs, beans, and tofu. ? Avoid fatty meat, cured and processed meat, or chicken with skin. ? Avoid pre-made or processed food.  Eat less than 1,500 mg of salt each day.  Do not drink alcohol if: ? Your doctor tells you not to drink. ? You are pregnant, may be pregnant, or are planning to become pregnant.  If you drink alcohol: ? Limit how much you use to:  0-1 drink a day for women.  0-2 drinks a day for men. ? Be aware of how much alcohol is in your drink. In the [place] one drink equals one 12 oz bottle of  beer (355 mL), one 5 oz glass of wine (148 mL), or one 1 oz glass of hard liquor (44 mL). Lifestyle   Work with your doctor to stay at a healthy weight or to lose weight. Ask your doctor what the best weight is for you.  Get at least 30 minutes of exercise most days of the week. This may include walking, swimming, or biking.  Get at least 30 minutes of exercise that strengthens your muscles (resistance exercise) at least 3 days a week. This may include lifting weights or doing  Pilates.  Do not use any products that contain nicotine or tobacco, such as cigarettes, e-cigarettes, and chewing tobacco. If you need help quitting, ask your doctor.  Check your blood pressure at home as told by your doctor.  Keep all follow-up visits as told by your doctor. This is important. Medicines  Take over-the-counter and prescription medicines only as told by your doctor. Follow directions carefully.  Do not skip doses of blood pressure medicine. The medicine does not work as well if you skip doses. Skipping doses also puts you at risk for problems.  Ask your doctor about side effects or reactions to medicines that you should watch for. Contact a doctor if you:  Think you are having a reaction to the medicine you are taking.  Have headaches that keep coming back (recurring).  Feel dizzy.  Have swelling in your ankles.  Have trouble with your vision. Get help right away if you:  Get a very bad headache.  Start to feel mixed up (confused).  Feel weak or numb.  Feel faint.  Have very bad pain in your: ? Chest. ? Belly (abdomen).  Throw up more than once.  Have trouble breathing. Summary  Hypertension is another name for high blood pressure.  High blood pressure forces your heart to work harder to pump blood.  For most people, a normal blood pressure is less than 120/80.  Making healthy choices can help lower blood pressure. If your blood pressure does not get lower with healthy choices, you may need to take medicine. This information is not intended to replace advice given to you by your health care provider. Make sure you discuss any questions you have with your health care provider. Document Revised: 05/25/2018 Document Reviewed: 05/25/2018 Elsevier Patient Education  2020 [employer].

## 2020-03-28 NOTE — Progress Notes (Signed)
Patient ID: Darren [name], male    DOB: 1961-08-08  MRN: [id number]  CC: Hypertension   Subjective: Darren [name] is a 59 y.o. male with history of hypertension, dilation of thoracic aorta, COPD with emphysema, GERD, Barrett esophagus, hypothyroidism, lipoma of neck, and chronic midline low back pain who presents for hypertension follow-up.  1. HYPERTENSION FOLLOW-UP: Currently taking: see medication list Have you taken your blood pressure medication today: [x]  Yes []  No  Med Adherence: [x]  Yes    []  No Medication side effects: []  Yes    [x]  No Adherence with salt restriction: [x]  Yes    []  No Exercise: Yes [x]  No []  Home Monitoring?: []  Yes    [x]  No Monitoring Frequency: []  Yes    [x]  No Home BP results range: []  Yes    [x]  No Smoking []  Yes [x]  No SOB? []  Yes    [x]  No Chest Pain?: []  Yes    [x]  No Leg swelling?: []  Yes    [x]  No Headaches?: []  Yes    [x]  No Dizziness? []  Yes    [x]  No Comments: Last visit 11/24/2019 with Dr. Wynetta Emery. During that encounter patient's blood pressure not at goal. Continued on Cozaar. Added low-dose Norvasc. CBC and CMP obtained.    2. CHOLESTEROL FOLLOW-UP: Last Lipid Panel results:  HDL  Date Value Ref Range Status  12/06/2019 47 >39 mg/dL Final   Triglycerides  Date Value Ref Range Status  12/06/2019 117 0 - 149 mg/dL Final    Med Adherence: [x]  Yes    []  No Medication side effects: []  Yes    [x]  No Muscle aches:  []  Yes    [x]  No Diet Adherence: []  Yes    [x]  No Comments: Last visit 11/24/2019 with Dr. Wynetta Emery. During that encounter continued on Atorvastatin. Lipid panel obtained.   3. HYPOTHYROIDISM FOLLOW-UP: Thyroid control status:stable Satisfied with current treatment? yes Medication side effects: no Medication compliance: good compliance Etiology of hypothyroidism:  Recent dose adjustment:no Fatigue: no Cold intolerance: no Heat intolerance: no Weight gain: yes, a little Weight loss: no Constipation: no Diarrhea/loose  stools: no Palpitations: no Lower extremity edema: no Anxiety/depressed mood: yes , some anxiety which is his baseline. Denies thoughts of self-harm and suicidal ideation. Last visit 11/24/2019 with Dr. Wynetta Emery. During that encounter continued on Levothyroxine. TSH obtained.   4. GERD FOLLOW-UP: GERD control status: stableSatisfied with current treatment? yes Medication side effects: no  Medication compliance: stable Dysphagia: no Odynophagia:  no Hematemesis: no Blood in stool: no EGD: no  Last visit 11/24/2019 with Dr. Wynetta Emery. During that encounter continued on Protonix.   5. LIPOMA FOLLOW-UP: Last visit 11/24/2019 with Dr. Wynetta Emery. During that encounter patient with lipoma on neck. Pending referral to general surgeon once approved for [facility] financial discount/orange card.   Today patient reports it seems like it has gotten bigger. Sometimes painful. Reports he has been approved for [facility] financial assistance.   Patient Active Problem List   Diagnosis Date Noted  . Lipoma of neck 11/24/2019  . Epistaxis 11/04/2017  . Class 1 obesity due to excess calories without serious comorbidity with body mass index (BMI) of 31.0 to 31.9 in adult 11/04/2017  . Bilateral wrist pain 04/22/2017  . Disorder of left sacroiliac joint 12/21/2016  . Spondylosis without myelopathy or radiculopathy, lumbar region 12/21/2016  . Lumbar degenerative disc disease 12/21/2016  . COPD (chronic obstructive pulmonary disease) with emphysema ([place]) 07/31/2016  . HTN (hypertension) 07/31/2016  . Dilation  of thoracic aorta ([place]) 07/31/2016  . Esophageal dysphagia 07/09/2016  . Chronic midline low back pain 07/09/2016  . Sensorineural hearing loss (SNHL), bilateral 03/30/2016  . Vitamin D deficiency 04/06/2014  . Hypothyroidism 10/31/2013  . GERD (gastroesophageal reflux disease) 11/25/2011  . Barrett esophagus 09/29/1999     Current Outpatient Medications on File Prior to Visit  Medication Sig  Dispense Refill  . amLODipine (NORVASC) 5 MG tablet Take 1 tablet (5 mg total) by mouth daily. 90 tablet 3  . atorvastatin (LIPITOR) 10 MG tablet Take 1 tablet (10 mg total) by mouth daily. 90 tablet 1  . diclofenac sodium (VOLTAREN) 1 % GEL Apply 4 g topically 4 (four) times daily. 150 g 5  . levothyroxine (SYNTHROID) 175 MCG tablet Take 1 tablet (175 mcg total) by mouth daily. 90 tablet 1  . losartan (COZAAR) 50 MG tablet Take 1 tablet (50 mg total) by mouth daily. 90 tablet 1  . ondansetron (ZOFRAN) 8 MG tablet Take 1 tablet (8 mg total) by mouth every 8 (eight) hours as needed for nausea or vomiting. 20 tablet 0  . pantoprazole (PROTONIX) 40 MG tablet Take 1 tablet (40 mg total) by mouth daily. 90 tablet 2   No current facility-administered medications on file prior to visit.    No Known Allergies  Social History   Socioeconomic History  . Marital status: Divorced    Spouse name: Not on file  . Number of children: 2  . Years of education: Not on file  . Highest education level: Not on file  Occupational History  . Not on file  Tobacco Use  . Smoking status: Former Smoker    Packs/day: 1.00    Years: 26.00    Pack years: 26.00    Types: Cigarettes    Quit date: 09/15/2004    Years since quitting: 15.5  . Smokeless tobacco: Never Used  . Tobacco comment: Quit in 2005  Substance and Sexual Activity  . Alcohol use: Yes    Comment: ON WEEKENDS  . Drug use: No  . Sexual activity: Not Currently    Birth control/protection: None  Other Topics Concern  . Not on file  Social History Narrative   Lives alone.     Social Determinants of Health   Financial Resource Strain:   . Difficulty of Paying Living Expenses:   Food Insecurity:   . Worried About Charity fundraiser in the Last Year:   . Arboriculturist in the Last Year:   Transportation Needs:   . Film/video editor (Medical):   Marland Kitchen Lack of Transportation (Non-Medical):   Physical Activity:   . Days of Exercise  per Week:   . Minutes of Exercise per Session:   Stress:   . Feeling of Stress :   Social Connections:   . Frequency of Communication with Friends and Family:   . Frequency of Social Gatherings with Friends and Family:   . Attends Religious Services:   . Active Member of Clubs or Organizations:   . Attends Archivist Meetings:   Marland Kitchen Marital Status:   Intimate Partner Violence:   . Fear of Current or Ex-Partner:   . Emotionally Abused:   Marland Kitchen Physically Abused:   . Sexually Abused:     Family History  Problem Relation Age of Onset  . Cancer Father        lung  . Stroke Father   . Heart attack Father 20  . Lung cancer Mother   .  Thyroid disease Sister   . Colon cancer Neg Hx   . Esophageal cancer Neg Hx     Past Surgical History:  Procedure Laterality Date  . BIOPSY  09/18/2016   Procedure: BIOPSY;  Surgeon: Daneil Dolin, MD;  [facility];  Service: Endoscopy;;  biopsy of distal esophagus  . ESOPHAGOGASTRODUODENOSCOPY  2001   barrett's per path  . ESOPHAGOGASTRODUODENOSCOPY (EGD) WITH PROPOFOL N/A 09/18/2016   Procedure: ESOPHAGOGASTRODUODENOSCOPY (EGD) WITH PROPOFOL;  Surgeon: Daneil Dolin, MD;  Location[facility];  Service: Endoscopy;  Laterality: N/A;  [address]  . [place]  . MULTIPLE EXTRACTIONS WITH ALVEOLOPLASTY  11/30/2011   Procedure: MULTIPLE EXTRACION WITH ALVEOLOPLASTY;  Surgeon: Gae Bon, DDS;  Location: [place];  Service: Oral Surgery;  Laterality: Bilateral;    ROS: Review of Systems Negative except as stated above  PHYSICAL EXAM: BP 135/80   Pulse 64   Temp 97.9 F (36.6 C)   Resp 16   Wt 182 lb (82.6 kg)   SpO2 97%   BMI 32.24 kg/m   Physical Exam General appearance - alert, well appearing, and in no distress and oriented to person, place, and time Mental status - alert, oriented to person, place, and time, normal mood, behavior, speech, dress, motor activity, and thought processes Neck - 10 cm soft  mobile soft tissue mass at the base of the back of the neck, patient with good range of motion Lymphatics - no palpable lymphadenopathy, no hepatosplenomegaly Chest - clear to auscultation, no wheezes, rales or rhonchi, symmetric air entry, no tachypnea, retractions or cyanosis Heart - normal rate, regular rhythm, normal S1, S2, no murmurs, rubs, clicks or gallops   ASSESSMENT AND PLAN: 1. Essential hypertension: -Blood pressure therapeutic during today's visit.  -Continue Amlodipine and Losartan as prescribed for management of hypertension.  -BMP to check kidney function.  -Last CMP 12/06/2019. -Counseled on blood pressure goal of less than 130/80, low-sodium, DASH diet, medication compliance, 150 minutes of moderate intensity exercise per week as tolerated. Discussed medication compliance, adverse effects. -Follow-up with primary physician in 3 months or sooner if needed. - Basic Metabolic Panel - amLODipine (NORVASC) 5 MG tablet; Take 1 tablet (5 mg total) by mouth daily.  Dispense: 30 tablet; Refill: 2 - losartan (COZAAR) 50 MG tablet; Take 1 tablet (50 mg total) by mouth daily.  Dispense: 30 tablet; Refill: 2  2. Mixed hyperlipidemia: -Continue Atorvastatin for management of cholesterol.  -Last lipid panel 12/06/2019. -Patient not fasting today. Will repeat lipid panel at next visit.  -Follow-up with primary physician in 3 months or sooner if needed. - atorvastatin (LIPITOR) 10 MG tablet; Take 1 tablet (10 mg total) by mouth daily.  Dispense: 30 tablet; Refill: 2  3. Hypothyroidism, unspecified type: -Continue Levothyroxine for management of hypothyroidism.  -Obtain TSH during today's visit.  -Follow-up with primary physician in 3 months or sooner if needed. - TSH - levothyroxine (SYNTHROID) 175 MCG tablet; Take 1 tablet (175 mcg total) by mouth daily.  Dispense: 30 tablet; Refill: 2  4. Gastroesophageal reflux disease, unspecified whether esophagitis present: -Continue  Pantoprazole for management of acid reflux. -Follow-up with primary physician in 3 months or sooner if needed. - pantoprazole (PROTONIX) 40 MG tablet; Take 1 tablet (40 mg total) by mouth daily.  Dispense: 30 tablet; Refill: 2  5. Lipoma of neck: -Patient with increase in size of lipoma of neck since last visit with primary physician on 11/24/2019. Denies pain  and drainage. Patient has good range of motion of neck.  -Referral to General Surgery for further evaluation and management. - Ambulatory referral to General Surgery  Patient was given the opportunity to ask questions.  Patient verbalized understanding of the plan and was able to repeat key elements of the plan.    Camillia Herter, NP

## 2020-03-29 LAB — BASIC METABOLIC PANEL
BUN/Creatinine Ratio: 23 — ABNORMAL HIGH (ref 9–20)
BUN: 19 mg/dL (ref 6–24)
CO2: 23 mmol/L (ref 20–29)
Calcium: 9.3 mg/dL (ref 8.7–10.2)
Chloride: 103 mmol/L (ref 96–106)
Creatinine, Ser: 0.82 mg/dL (ref 0.76–1.27)
GFR calc Af Amer: 112 mL/min/{1.73_m2} (ref >59)
GFR calc non Af Amer: 97 mL/min/{1.73_m2} (ref >59)
Glucose: 135 mg/dL — ABNORMAL HIGH (ref 65–99)
Potassium: 4.2 mmol/L (ref 3.5–5.2)
Sodium: 138 mmol/L (ref 134–144)

## 2020-03-29 LAB — TSH: TSH: 0.922 u[IU]/mL (ref 0.450–4.500)

## 2020-03-30 NOTE — Progress Notes (Signed)
Please call patient with update.   Thyroid function normal.   Kidney function normal.

## 2020-03-30 NOTE — Addendum Note (Signed)
Addended by: Camillia Herter on: 03/30/2020 03:31 PM   Modules accepted: Orders

## 2020-04-03 ENCOUNTER — Telehealth: Payer: Self-pay

## 2020-04-03 NOTE — Telephone Encounter (Signed)
Contacted pt to go over lab results pt didn't answer lvm asking pt to give a call at his earliest convenience

## 2020-04-09 ENCOUNTER — Telehealth: Payer: Self-pay | Admitting: Internal Medicine

## 2020-04-09 NOTE — Telephone Encounter (Signed)
Please advise  Copied from [place] [id number]. Topic: General - Other >> Apr 09, 2020  9:18 AM Alanda Slim E wrote: Reason for CRM: Pt wanted the back of his neck checked out and was sent a letter for a provider in [place]/ Pt doesn't want to travel to [place] and asked for a provider in his area or in [place]/ Pt doesn't want to go to another city /please advise

## 2020-04-09 NOTE — Telephone Encounter (Signed)
Good Afternoon  Patient is uninsured and we don't have general surgery in [place] in our network  Unable to contact patient  .

## 2020-04-15 NOTE — Telephone Encounter (Signed)
Pt called back requesting to speak with office regarding alternative options. Please advise, states he does not want to keep upcoming appt with location in [place]

## 2020-04-16 NOTE — Telephone Encounter (Signed)
I called patient lvm that unfortunately we don't have a general surgery affiliated with [place]  and if he would like to see someone in [place] will need to pay out of his pocket  Thank you

## 2020-04-18 ENCOUNTER — Ambulatory Visit: Payer: Self-pay | Admitting: General Surgery

## 2020-04-23 ENCOUNTER — Encounter: Payer: Self-pay | Admitting: *Deleted

## 2020-04-24 MED FILL — LOSARTAN POTASSIUM 50 MG TA: 50 | 30 days supply | Qty: 30 | Fill #5

## 2020-04-24 MED FILL — DICLOFENAC SODIUM 1% GEL: 1 | 6 days supply | Qty: 100 | Fill #7

## 2020-04-24 MED FILL — ?LEVOTHYROXINE SODIUM 175 M: 175 | 30 days supply | Qty: 30 | Fill #5

## 2020-04-24 MED FILL — ?ATORVASTATIN 10 MG TABLET: 10 | 30 days supply | Qty: 30 | Fill #5

## 2020-04-24 MED FILL — ?AMLODIPINE BESYLATE 5MG TA: 5 | 30 days supply | Qty: 30 | Fill #5

## 2020-04-24 MED FILL — ?PANTOPRAZOLE SODI DR 40MGT: 40 | 30 days supply | Qty: 30 | Fill #5

## 2020-05-28 MED FILL — AMLODIPINE BESYLATE 5 MG TA: 5 | 30 days supply | Qty: 30 | Fill #6

## 2020-05-28 MED FILL — PANTOPRAZOLE SOD DR 40 MG T: 40 | 30 days supply | Qty: 30 | Fill #6

## 2020-05-28 MED FILL — DICLOFENAC SODIUM 1% GEL: 1 | 6 days supply | Qty: 100 | Fill #8

## 2020-05-28 MED FILL — ?ATORVASTATIN 10 MG TABLET: 10 | 30 days supply | Qty: 30 | Fill #0

## 2020-05-28 MED FILL — LOSARTAN POTASSIUM 50 MG TA: 50 | 30 days supply | Qty: 30 | Fill #0

## 2020-05-28 MED FILL — LEVOTHYROXINE SODIUM 175 MC: 175 | 30 days supply | Qty: 30 | Fill #0

## 2020-06-11 ENCOUNTER — Encounter: Payer: Self-pay | Admitting: *Deleted

## 2020-06-26 ENCOUNTER — Other Ambulatory Visit: Payer: Self-pay | Admitting: Family Medicine

## 2020-06-26 DIAGNOSIS — M25522 Pain in left elbow: Secondary | ICD-10-CM

## 2020-06-26 DIAGNOSIS — G8929 Other chronic pain: Secondary | ICD-10-CM

## 2020-06-26 MED FILL — AMLODIPINE BESYLATE 5 MG TA: 5 | 30 days supply | Qty: 30 | Fill #7

## 2020-06-26 MED FILL — DICLOFENAC SODIUM 1% GEL: 1 | 6 days supply | Qty: 100 | Fill #0

## 2020-06-26 MED FILL — PANTOPRAZOLE SOD DR 40 MG T: 40 | 30 days supply | Qty: 30 | Fill #7

## 2020-06-26 MED FILL — ?ATORVASTATIN 10 MG TABLET: 10 | 30 days supply | Qty: 30 | Fill #1

## 2020-06-26 MED FILL — LEVOTHYROXINE SODIUM 175 MC: 175 | 30 days supply | Qty: 30 | Fill #1

## 2020-06-26 MED FILL — LOSARTAN POTASSIUM 50 MG TA: 50 | 30 days supply | Qty: 30 | Fill #1

## 2020-06-26 NOTE — Telephone Encounter (Signed)
Requested Prescriptions  Pending Prescriptions Disp Refills   diclofenac Sodium (VOLTAREN) 1 % GEL [Pharmacy Med Name: DICLOFENAC SODIUM 1% GEL 1 Gel] 100 g 5    Sig: APPLY 4 G TOPICALLY 4 (FOUR) TIMES DAILY.     Analgesics:  Topicals Passed - 06/26/2020  9:13 AM      Passed - Valid encounter within last 12 months    Recent Outpatient Visits          3 months ago Essential hypertension   [place], [place], NP   7 months ago Essential hypertension   [place] Ladell Pier, MD   8 months ago COVID-19 virus infection   [place] Ladell Pier, MD   11 months ago Acute pain of left shoulder   [place] Antony Blackbird, MD   1 year ago Close Exposure to [place] Ladell Pier, MD

## 2020-07-08 ENCOUNTER — Ambulatory Visit: Payer: Self-pay | Attending: Internal Medicine

## 2020-07-08 ENCOUNTER — Other Ambulatory Visit: Payer: Self-pay

## 2020-07-24 MED FILL — AMLODIPINE BESYLATE 5 MG TA: 5 | 30 days supply | Qty: 30 | Fill #8

## 2020-07-24 MED FILL — LOSARTAN POTASSIUM 50 MG TA: 50 | 30 days supply | Qty: 30 | Fill #2

## 2020-07-24 MED FILL — ?ATORVASTATIN 10 MG TABLET: 10 | 30 days supply | Qty: 30 | Fill #2

## 2020-07-24 MED FILL — LEVOTHYROXINE SODIUM 175 MC: 175 | 30 days supply | Qty: 30 | Fill #2

## 2020-07-24 MED FILL — DICLOFENAC SODIUM 1% GEL: 1 | 6 days supply | Qty: 100 | Fill #1

## 2020-07-24 MED FILL — PANTOPRAZOLE SOD DR 40 MG T: 40 | 30 days supply | Qty: 30 | Fill #8

## 2020-08-06 ENCOUNTER — Encounter: Payer: Self-pay | Admitting: Family

## 2020-08-06 ENCOUNTER — Ambulatory Visit: Payer: Self-pay | Attending: Family | Admitting: Family

## 2020-08-06 ENCOUNTER — Other Ambulatory Visit: Payer: Self-pay

## 2020-08-06 VITALS — BP 132/68 | HR 60 | Resp 16 | Wt 179.0 lb

## 2020-08-06 DIAGNOSIS — K219 Gastro-esophageal reflux disease without esophagitis: Secondary | ICD-10-CM

## 2020-08-06 DIAGNOSIS — E039 Hypothyroidism, unspecified: Secondary | ICD-10-CM

## 2020-08-06 DIAGNOSIS — E782 Mixed hyperlipidemia: Secondary | ICD-10-CM

## 2020-08-06 DIAGNOSIS — I10 Essential (primary) hypertension: Secondary | ICD-10-CM

## 2020-08-06 NOTE — Patient Instructions (Addendum)
Amlodipine and Losartan for high blood pressure.   Atorvastatin for high cholesterol.   Synthroid for hypothyroidism.  Protonix for GERD.   Lab today.   Follow-up in 3 months with primary physician or sooner if needed.  Hypertension, Adult Hypertension is another name for high blood pressure. High blood pressure forces your heart to work harder to pump blood. This can cause problems over time. There are two numbers in a blood pressure reading. There is a top number (systolic) over a bottom number (diastolic). It is best to have a blood pressure that is below 120/80. Healthy choices can help lower your blood pressure, or you may need medicine to help lower it. What are the causes? The cause of this condition is not known. Some conditions may be related to high blood pressure. What increases the risk?  Smoking.  Having type 2 diabetes mellitus, high cholesterol, or both.  Not getting enough exercise or physical activity.  Being overweight.  Having too much fat, sugar, calories, or salt (sodium) in your diet.  Drinking too much alcohol.  Having long-term (chronic) kidney disease.  Having a family history of high blood pressure.  Age. Risk increases with age.  Race. You may be at higher risk if you are African American.  Gender. Men are at higher risk than women before age 24. After age 86, women are at higher risk than men.  Having obstructive sleep apnea.  Stress. What are the signs or symptoms?  High blood pressure may not cause symptoms. Very high blood pressure (hypertensive crisis) may cause: ? Headache. ? Feelings of worry or nervousness (anxiety). ? Shortness of breath. ? Nosebleed. ? A feeling of being sick to your stomach (nausea). ? Throwing up (vomiting). ? Changes in how you see. ? Very bad chest pain. ? Seizures. How is this treated?  This condition is treated by making healthy lifestyle changes, such as: ? Eating healthy foods. ? Exercising  more. ? Drinking less alcohol.  Your health care provider may prescribe medicine if lifestyle changes are not enough to get your blood pressure under control, and if: ? Your top number is above 130. ? Your bottom number is above 80.  Your personal target blood pressure may vary. Follow these instructions at home: Eating and drinking   If told, follow the DASH eating plan. To follow this plan: ? Fill one half of your plate at each meal with fruits and vegetables. ? Fill one fourth of your plate at each meal with whole grains. Whole grains include whole-wheat pasta, brown rice, and whole-grain bread. ? Eat or drink low-fat dairy products, such as skim milk or low-fat yogurt. ? Fill one fourth of your plate at each meal with low-fat (lean) proteins. Low-fat proteins include fish, chicken without skin, eggs, beans, and tofu. ? Avoid fatty meat, cured and processed meat, or chicken with skin. ? Avoid pre-made or processed food.  Eat less than 1,500 mg of salt each day.  Do not drink alcohol if: ? Your doctor tells you not to drink. ? You are pregnant, may be pregnant, or are planning to become pregnant.  If you drink alcohol: ? Limit how much you use to:  0-1 drink a day for women.  0-2 drinks a day for men. ? Be aware of how much alcohol is in your drink. In the [place] one drink equals one 12 oz bottle of beer (355 mL), one 5 oz glass of wine (148 mL), or one 1 oz glass  of hard liquor (44 mL). Lifestyle   Work with your doctor to stay at a healthy weight or to lose weight. Ask your doctor what the best weight is for you.  Get at least 30 minutes of exercise most days of the week. This may include walking, swimming, or biking.  Get at least 30 minutes of exercise that strengthens your muscles (resistance exercise) at least 3 days a week. This may include lifting weights or doing Pilates.  Do not use any products that contain nicotine or tobacco, such as cigarettes, e-cigarettes,  and chewing tobacco. If you need help quitting, ask your doctor.  Check your blood pressure at home as told by your doctor.  Keep all follow-up visits as told by your doctor. This is important. Medicines  Take over-the-counter and prescription medicines only as told by your doctor. Follow directions carefully.  Do not skip doses of blood pressure medicine. The medicine does not work as well if you skip doses. Skipping doses also puts you at risk for problems.  Ask your doctor about side effects or reactions to medicines that you should watch for. Contact a doctor if you:  Think you are having a reaction to the medicine you are taking.  Have headaches that keep coming back (recurring).  Feel dizzy.  Have swelling in your ankles.  Have trouble with your vision. Get help right away if you:  Get a very bad headache.  Start to feel mixed up (confused).  Feel weak or numb.  Feel faint.  Have very bad pain in your: ? Chest. ? Belly (abdomen).  Throw up more than once.  Have trouble breathing. Summary  Hypertension is another name for high blood pressure.  High blood pressure forces your heart to work harder to pump blood.  For most people, a normal blood pressure is less than 120/80.  Making healthy choices can help lower blood pressure. If your blood pressure does not get lower with healthy choices, you may need to take medicine. This information is not intended to replace advice given to you by your health care provider. Make sure you discuss any questions you have with your health care provider. Document Revised: 05/25/2018 Document Reviewed: 05/25/2018 Elsevier Patient Education  2020 [employer].

## 2020-08-06 NOTE — Progress Notes (Signed)
Patient ID: BROXTON BROADY, male    DOB: 01-May-1961  MRN: [id number]  CC: Hypertension   Subjective: Darren [name] is a 59 y.o. male with history of hypertension, dilation of thoracic aorta, COPD, GERD, hypothyroidism, and chronic midline low back pain who presents for medication refills.  1. HYPERTENSION FOLLOW-UP: 03/28/2020: Blood pressure at goal. Continued on Amlodipine and Losartan.   08/06/2020: Currently taking: see medication list Have you taken your blood pressure medication today: [x]  Yes []  No  Med Adherence: [x]  Yes    []  No Medication side effects: []  Yes    [x]  No Adherence with salt restriction: []  Yes    [x]  No Exercise: Yes [x]  No []  Home Monitoring?: []  Yes    [x]  No Monitoring Frequency: []  Yes    [x]  No Home BP results range: []  Yes    [x]  No Smoking []  Yes [x]  No SOB? []  Yes    [x]  No Chest Pain?: []  Yes    [x]  No Leg swelling?: []  Yes    [x]  No Headaches?: []  Yes    [x]  No Dizziness? []  Yes    [x]  No  2. HYPERLIPIDEMIA FOLLOW-UP: 03/28/2020: Continued on Atorvastatin.   08/06/2020:  Last Lipid Panel results:  HDL  Date Value Ref Range Status  12/06/2019 47 >39 mg/dL Final   Triglycerides  Date Value Ref Range Status  12/06/2019 117 0 - 149 mg/dL Final    Med Adherence: [x]  Yes    []  No Medication side effects: []  Yes    [x]  No Muscle aches:  []  Yes    [x]  No Diet Adherence: []  Yes    [x]  No  3. HYPOTHYROIDISM FOLLOW-UP: 04/06/2020: Continued on Levothyroxine. Labs obtained.   08/06/2020: Thyroid control status:stable Satisfied with current treatment? yes Medication side effects: no Medication compliance: good compliance Etiology of hypothyroidism:  Recent dose adjustment:no Fatigue: no Cold intolerance: no Heat intolerance: no Weight gain: no Weight loss: no Constipation: no Diarrhea/loose stools: no Palpitations: no Lower extremity edema: no Anxiety/depressed mood: no  4. GERD:  04/06/2020: Continued on  Protonix.  08/06/2020: Stable and requesting refills.    Patient Active Problem List   Diagnosis Date Noted  . Lipoma of neck 11/24/2019  . Epistaxis 11/04/2017  . Class 1 obesity due to excess calories without serious comorbidity with body mass index (BMI) of 31.0 to 31.9 in adult 11/04/2017  . Bilateral wrist pain 04/22/2017  . Disorder of left sacroiliac joint 12/21/2016  . Spondylosis without myelopathy or radiculopathy, lumbar region 12/21/2016  . Lumbar degenerative disc disease 12/21/2016  . COPD (chronic obstructive pulmonary disease) with emphysema ([place]) 07/31/2016  . HTN (hypertension) 07/31/2016  . Dilation of thoracic aorta ([place]) 07/31/2016  . Esophageal dysphagia 07/09/2016  . Chronic midline low back pain 07/09/2016  . Sensorineural hearing loss (SNHL), bilateral 03/30/2016  . Vitamin D deficiency 04/06/2014  . Hypothyroidism 10/31/2013  . GERD (gastroesophageal reflux disease) 11/25/2011  . Barrett esophagus 09/29/1999     Current Outpatient Medications on File Prior to Visit  Medication Sig Dispense Refill  . diclofenac Sodium (VOLTAREN) 1 % GEL APPLY 4 G TOPICALLY 4 (FOUR) TIMES DAILY. 100 g 5  . ondansetron (ZOFRAN) 8 MG tablet Take 1 tablet (8 mg total) by mouth every 8 (eight) hours as needed for nausea or vomiting. 20 tablet 0   No current facility-administered medications on file prior to visit.    No Known Allergies  Social History   Socioeconomic History  . Marital status:  Divorced    Spouse name: Not on file  . Number of children: 2  . Years of education: Not on file  . Highest education level: Not on file  Occupational History  . Not on file  Tobacco Use  . Smoking status: Former Smoker    Packs/day: 1.00    Years: 26.00    Pack years: 26.00    Types: Cigarettes    Quit date: 09/15/2004    Years since quitting: 15.9  . Smokeless tobacco: Never Used  . Tobacco comment: Quit in 2005  Substance and Sexual Activity  . Alcohol use: Yes     Comment: ON WEEKENDS  . Drug use: No  . Sexual activity: Not Currently    Birth control/protection: None  Other Topics Concern  . Not on file  Social History Narrative   Lives alone.     Social Determinants of Health   Financial Resource Strain:   . Difficulty of Paying Living Expenses: Not on file  Food Insecurity:   . Worried About Charity fundraiser in the Last Year: Not on file  . Ran Out of Food in the Last Year: Not on file  Transportation Needs:   . Lack of Transportation (Medical): Not on file  . Lack of Transportation (Non-Medical): Not on file  Physical Activity:   . Days of Exercise per Week: Not on file  . Minutes of Exercise per Session: Not on file  Stress:   . Feeling of Stress : Not on file  Social Connections:   . Frequency of Communication with Friends and Family: Not on file  . Frequency of Social Gatherings with Friends and Family: Not on file  . Attends Religious Services: Not on file  . Active Member of Clubs or Organizations: Not on file  . Attends Archivist Meetings: Not on file  . Marital Status: Not on file  Intimate Partner Violence:   . Fear of Current or Ex-Partner: Not on file  . Emotionally Abused: Not on file  . Physically Abused: Not on file  . Sexually Abused: Not on file    Family History  Problem Relation Age of Onset  . Cancer Father        lung  . Stroke Father   . Heart attack Father 28  . Lung cancer Mother   . Thyroid disease Sister   . Colon cancer Neg Hx   . Esophageal cancer Neg Hx     Past Surgical History:  Procedure Laterality Date  . BIOPSY  09/18/2016   Procedure: BIOPSY;  Surgeon: Daneil Dolin, MD;  [facility];  Service: Endoscopy;;  biopsy of distal esophagus  . ESOPHAGOGASTRODUODENOSCOPY  2001   barrett's per path  . ESOPHAGOGASTRODUODENOSCOPY (EGD) WITH PROPOFOL N/A 09/18/2016   Procedure: ESOPHAGOGASTRODUODENOSCOPY (EGD) WITH PROPOFOL;  Surgeon: Daneil Dolin, MD;  [facility];  Service: Endoscopy;  Laterality: N/A;  1200  . [place]  . MULTIPLE EXTRACTIONS WITH ALVEOLOPLASTY  11/30/2011   Procedure: MULTIPLE EXTRACION WITH ALVEOLOPLASTY;  Surgeon: Gae Bon, DDS;  Location: [place];  Service: Oral Surgery;  Laterality: Bilateral;    ROS: Review of Systems Negative except as stated above  PHYSICAL EXAM: BP 132/68 (BP Location: Left Arm, Patient Position: Sitting)   Pulse 60   Resp 16   Wt 179 lb (81.2 kg)   SpO2 95%   BMI 31.71 kg/m    Wt Readings from Last  3 Encounters:  08/06/20 179 lb (81.2 kg)  03/28/20 182 lb (82.6 kg)  01/18/20 180 lb (81.6 kg)    Physical Exam General appearance - alert, well appearing, and in no distress and oriented to person, place, and time Mental status - alert, oriented to person, place, and time, normal mood, behavior, speech, dress, motor activity, and thought processes Neck - supple, no significant adenopathy Lymphatics - no palpable lymphadenopathy, no hepatosplenomegaly Chest - clear to auscultation, no wheezes, rales or rhonchi, symmetric air entry, no tachypnea, retractions or cyanosis Heart - normal rate, regular rhythm, normal S1, S2, no murmurs, rubs, clicks or gallops Neurological - alert, oriented, normal speech, no focal findings or movement disorder noted  ASSESSMENT AND PLAN: 1. Essential hypertension: - Continue Amlodipine and Losartan as prescribed.  - BMP to check kidney function and electrolyte balance.  - Counseled on blood pressure goal of less than 130/80, low-sodium, DASH diet, medication compliance, 150 minutes of moderate intensity exercise per week as tolerated. Discussed medication compliance, adverse effects. - Follow-up with primary physician in 3 months or sooner if needed. - Basic Metabolic Panel - amLODipine (NORVASC) 5 MG tablet; Take 1 tablet (5 mg total) by mouth daily.  Dispense: 30 tablet; Refill: 2 - losartan (COZAAR) 50 MG tablet; Take 1 tablet (50  mg total) by mouth daily.  Dispense: 30 tablet; Refill: 2  2. Mixed hyperlipidemia: - Practice low-fat heart healthy diet and at least 150 minutes of moderate intensity exercise weekly as tolerated.  - Continue Atorvastatin as prescribed.  - Last lipid panel obtained 12/06/2019. - Follow-up with primary physician as scheduled. - atorvastatin (LIPITOR) 10 MG tablet; Take 1 tablet (10 mg total) by mouth daily.  Dispense: 30 tablet; Refill: 2  3. Hypothyroidism, unspecified type: - Continue Levothyroxine as prescribed. - Last TSH obtained July 2021. - Follow-up with primary physician as scheduled. - levothyroxine (SYNTHROID) 175 MCG tablet; Take 1 tablet (175 mcg total) by mouth daily.  Dispense: 30 tablet; Refill: 2  4. Gastroesophageal reflux disease, unspecified whether esophagitis present: - Continue Pantoprazole as prescribed. - Follow-up with primary physician as scheduled. - pantoprazole (PROTONIX) 40 MG tablet; Take 1 tablet (40 mg total) by mouth daily.  Dispense: 30 tablet; Refill: 2 - You may feel better if you:  ?Lose weight (if you are overweight)  ?Raise the head of your bed by 6 to 8 inches - You can do this by putting blocks of wood or rubber under 2 legs of the bed or a foam wedge under the mattress.  ?Avoid foods that make your symptoms worse - For some people these include coffee, chocolate, alcohol, peppermint, and fatty foods.  ?Stop smoking, if you smoke  ?Avoid late meals - Lying down with a full stomach can make reflux worse. Try to plan meals for at least 2 to 3 hours before bedtime.  ?Avoid tight clothing - Some people feel better if they wear comfortable clothing that does not squeeze the stomach area.  Patient was given the opportunity to ask questions.  Patient verbalized understanding of the plan and was able to repeat key elements of the plan. Patient was given clear instructions to go to Emergency Department or return to medical center if symptoms don't  improve, worsen, or new problems develop.The patient verbalized understanding.   Orders Placed This Encounter  Procedures  . Basic Metabolic Panel     Requested Prescriptions   Signed Prescriptions Disp Refills  . amLODipine (NORVASC) 5 MG tablet 30 tablet 2  Sig: Take 1 tablet (5 mg total) by mouth daily.  Marland Kitchen atorvastatin (LIPITOR) 10 MG tablet 30 tablet 2    Sig: Take 1 tablet (10 mg total) by mouth daily.  Marland Kitchen levothyroxine (SYNTHROID) 175 MCG tablet 30 tablet 2    Sig: Take 1 tablet (175 mcg total) by mouth daily.  Marland Kitchen losartan (COZAAR) 50 MG tablet 30 tablet 2    Sig: Take 1 tablet (50 mg total) by mouth daily.  . pantoprazole (PROTONIX) 40 MG tablet 30 tablet 2    Sig: Take 1 tablet (40 mg total) by mouth daily.    Return in about 3 months (around 11/06/2020) for Dr. Wynetta Emery.  Camillia Herter, NP

## 2020-08-07 ENCOUNTER — Other Ambulatory Visit: Payer: Self-pay | Admitting: Family

## 2020-08-07 LAB — BASIC METABOLIC PANEL
BUN/Creatinine Ratio: 21 — ABNORMAL HIGH (ref 9–20)
BUN: 16 mg/dL (ref 6–24)
CO2: 24 mmol/L (ref 20–29)
Calcium: 9.6 mg/dL (ref 8.7–10.2)
Chloride: 100 mmol/L (ref 96–106)
Creatinine, Ser: 0.77 mg/dL (ref 0.76–1.27)
GFR calc Af Amer: 115 mL/min/{1.73_m2} (ref >59)
GFR calc non Af Amer: 99 mL/min/{1.73_m2} (ref >59)
Glucose: 90 mg/dL (ref 65–99)
Potassium: 4 mmol/L (ref 3.5–5.2)
Sodium: 136 mmol/L (ref 134–144)

## 2020-08-07 MED ORDER — AMLODIPINE BESYLATE 5 MG PO TABS: 5.0000 mg | ORAL_TABLET | Freq: Every day | ORAL | 2 refills | Status: DC

## 2020-08-07 MED ORDER — LEVOTHYROXINE SODIUM 175 MCG PO TABS: 175.0000 ug | ORAL_TABLET | Freq: Every day | ORAL | 2 refills | Status: DC

## 2020-08-07 MED ORDER — LOSARTAN POTASSIUM 50 MG PO TABS: 50.0000 mg | ORAL_TABLET | Freq: Every day | ORAL | 2 refills | Status: DC

## 2020-08-07 MED ORDER — PANTOPRAZOLE SODIUM 40 MG PO TBEC: 40.0000 mg | DELAYED_RELEASE_TABLET | Freq: Every day | ORAL | 2 refills | Status: DC

## 2020-08-07 MED ORDER — ATORVASTATIN CALCIUM 10 MG PO TABS: 10.0000 mg | ORAL_TABLET | Freq: Every day | ORAL | 2 refills | Status: DC

## 2020-08-08 NOTE — Progress Notes (Signed)
Normal result letter generated and mailed to address on file. 

## 2020-08-08 NOTE — Progress Notes (Signed)
Please call patient with update.   Kidney function normal.

## 2020-08-21 MED FILL — PANTOPRAZOLE SOD DR 40 MG T: 40 | 30 days supply | Qty: 30 | Fill #0

## 2020-08-21 MED FILL — ?ATORVASTATIN 10 MG TABLET: 10 | 30 days supply | Qty: 30 | Fill #0

## 2020-08-21 MED FILL — AMLODIPINE BESYLATE 5 MG TA: 5 | 30 days supply | Qty: 30 | Fill #9

## 2020-08-21 MED FILL — LEVOTHYROXINE SODIUM 175 MC: 175 | 30 days supply | Qty: 30 | Fill #0

## 2020-08-21 MED FILL — ?LOSARTAN POTASSIUM 50 MG T: 50 | 30 days supply | Qty: 30 | Fill #0

## 2020-09-12 ENCOUNTER — Telehealth (HOSPITAL_COMMUNITY): Payer: Self-pay

## 2020-09-12 ENCOUNTER — Encounter (HOSPITAL_COMMUNITY): Payer: Self-pay

## 2020-09-12 ENCOUNTER — Ambulatory Visit (HOSPITAL_COMMUNITY)
Admission: EM | Admit: 2020-09-12 | Discharge: 2020-09-12 | Disposition: A | Discharge status: Home / Self Care | Payer: Self-pay | Attending: Student | Admitting: Student

## 2020-09-12 ENCOUNTER — Other Ambulatory Visit: Payer: Self-pay

## 2020-09-12 ENCOUNTER — Other Ambulatory Visit (HOSPITAL_COMMUNITY): Payer: Self-pay | Admitting: Student

## 2020-09-12 DIAGNOSIS — K047 Periapical abscess without sinus: Secondary | ICD-10-CM

## 2020-09-12 DIAGNOSIS — K0889 Other specified disorders of teeth and supporting structures: Secondary | ICD-10-CM

## 2020-09-12 MED ORDER — AMOXICILLIN-POT CLAVULANATE 875-125 MG PO TABS: 1.0000 | ORAL_TABLET | Freq: Two times a day (BID) | ORAL | 0 refills | Status: DC

## 2020-09-12 MED ORDER — LIDOCAINE VISCOUS HCL 2 % MT SOLN: 15.0000 mL | OROMUCOSAL | 0 refills | Status: DC | PRN

## 2020-09-12 MED FILL — ?AMOX-CLAV 875-125 MG TABL: 875-125 | 7 days supply | Qty: 14 | Fill #0

## 2020-09-12 MED FILL — LIDOCAINE 2% VISCOUS SOLN: 2 | 6 days supply | Qty: 600 | Fill #0

## 2020-09-12 NOTE — Discharge Instructions (Addendum)
For your dental pain, start the antibiotic (Augmentin). Take this twice daily for 7 days. Also use the Lidocaine mouthwash as needed for pain, up to every 4 hours. Gargle the mouthwash and spit it out. Make sure not to eat anything within 1 hour of gargling mouthwash.   Seek additional medical attention if you develop worsening dental pain, mouth swelling, fevers, shortness of breath, chest pain, etc.

## 2020-09-12 NOTE — ED Triage Notes (Signed)
Pt presents with left side dental pain since last week.

## 2020-09-12 NOTE — ED Provider Notes (Signed)
[facility]    CSN: [id number] Arrival date & time: 09/12/20  1133      History   Chief Complaint Chief Complaint  Patient presents with  . Dental Pain    HPI Darren [name] is a 59 y.o. male presenting for dental pain for 1 day. He states that the upper left side of his jaw has been hurting for 1 day. He hasn't been able to put in his dentures. Pain radiates to ear. Has been trying ibuprofen with some improvement in pain. Denies fevers/chills. Denies chest pain, shortness of breath, headache, urinary symptoms.  HPI  Past Medical History:  Diagnosis Date  . Arthritis    per Medical clearance form.  . Ascending aortic aneurysm ([place]) 06/2016   4.2 cm  . Barrett esophagus 2001   path report in EPIC 2001 c/w Barrett's. endoscopy report unavailable.  Marland Kitchen COPD (chronic obstructive pulmonary disease) ([place])    per medical clearance form.  . Depression    per Medical Clearance form(09/18/11)  . GERD (gastroesophageal reflux disease)    per Medical clearance form.  . Hypertension    Per Medical Clearance form.  . Hypothyroidism    per Medical Clearance form.    Patient Active Problem List   Diagnosis Date Noted  . Lipoma of neck 11/24/2019  . Epistaxis 11/04/2017  . Class 1 obesity due to excess calories without serious comorbidity with body mass index (BMI) of 31.0 to 31.9 in adult 11/04/2017  . Bilateral wrist pain 04/22/2017  . Disorder of left sacroiliac joint 12/21/2016  . Spondylosis without myelopathy or radiculopathy, lumbar region 12/21/2016  . Lumbar degenerative disc disease 12/21/2016  . COPD (chronic obstructive pulmonary disease) with emphysema ([place]) 07/31/2016  . HTN (hypertension) 07/31/2016  . Dilation of thoracic aorta ([place]) 07/31/2016  . Esophageal dysphagia 07/09/2016  . Chronic midline low back pain 07/09/2016  . Sensorineural hearing loss (SNHL), bilateral 03/30/2016  . Vitamin D deficiency 04/06/2014  . Hypothyroidism 10/31/2013  . GERD  (gastroesophageal reflux disease) 11/25/2011  . Barrett esophagus 09/29/1999    Past Surgical History:  Procedure Laterality Date  . BIOPSY  09/18/2016   Procedure: BIOPSY;  Surgeon: Daneil Dolin, MD;  [facility];  Service: Endoscopy;;  biopsy of distal esophagus  . ESOPHAGOGASTRODUODENOSCOPY  2001   barrett's per path  . ESOPHAGOGASTRODUODENOSCOPY (EGD) WITH PROPOFOL N/A 09/18/2016   Procedure: ESOPHAGOGASTRODUODENOSCOPY (EGD) WITH PROPOFOL;  Surgeon: Daneil Dolin, MD;  [facility];  Service: Endoscopy;  Laterality: N/A;  1200  . [place]  . MULTIPLE EXTRACTIONS WITH ALVEOLOPLASTY  11/30/2011   Procedure: MULTIPLE EXTRACION WITH ALVEOLOPLASTY;  Surgeon: Gae Bon, DDS;  Location: [place];  Service: Oral Surgery;  Laterality: Bilateral;       Home Medications    Prior to Admission medications   Medication Sig Start Date End Date Taking? Authorizing Provider  amLODipine (NORVASC) 5 MG tablet Take 1 tablet (5 mg total) by mouth daily. 08/07/20   Camillia Herter, NP  amoxicillin-clavulanate (AUGMENTIN) 875-125 MG tablet Take 1 tablet by mouth every 12 (twelve) hours. 09/12/20   Hazel Sams, PA-C  atorvastatin (LIPITOR) 10 MG tablet Take 1 tablet (10 mg total) by mouth daily. 08/07/20   Camillia Herter, NP  diclofenac Sodium (VOLTAREN) 1 % GEL APPLY 4 G TOPICALLY 4 (FOUR) TIMES DAILY. 06/26/20   Fulp, Cammie, MD  levothyroxine (SYNTHROID) 175 MCG tablet Take 1 tablet (175 mcg  total) by mouth daily. 08/07/20   Camillia Herter, NP  lidocaine (XYLOCAINE) 2 % solution Use as directed 15 mLs in the mouth or throat every 4 (four) hours as needed for up to 7 days for mouth pain (Gargle and spit out every 4 hours as needed). 09/12/20 09/19/20  Hazel Sams, PA-C  losartan (COZAAR) 50 MG tablet Take 1 tablet (50 mg total) by mouth daily. 08/07/20   Camillia Herter, NP  ondansetron (ZOFRAN) 8 MG tablet Take 1 tablet (8 mg total) by mouth every  8 (eight) hours as needed for nausea or vomiting. 01/18/20   Raylene Everts, MD  pantoprazole (PROTONIX) 40 MG tablet Take 1 tablet (40 mg total) by mouth daily. 08/07/20   Camillia Herter, NP    Family History Family History  Problem Relation Age of Onset  . Cancer Father        lung  . Stroke Father   . Heart attack Father 64  . Lung cancer Mother   . Thyroid disease Sister   . Colon cancer Neg Hx   . Esophageal cancer Neg Hx     Social History Social History   Tobacco Use  . Smoking status: Former Smoker    Packs/day: 1.00    Years: 26.00    Pack years: 26.00    Types: Cigarettes    Quit date: 09/15/2004    Years since quitting: 16.0  . Smokeless tobacco: Never Used  . Tobacco comment: Quit in 2005  Substance Use Topics  . Alcohol use: Yes    Comment: ON WEEKENDS  . Drug use: No     Allergies   Patient has no known allergies.   Review of Systems Review of Systems  Constitutional: Negative for chills and fever.  HENT: Positive for dental problem, ear pain and facial swelling. Negative for congestion, ear discharge, sinus pressure, sinus pain, sneezing and sore throat.   Respiratory: Negative for cough and shortness of breath.   Cardiovascular: Negative for chest pain.  All other systems reviewed and are negative.    Physical Exam Triage Vital Signs ED Triage Vitals  Enc Vitals Group     BP 09/12/20 1425 (!) 167/96     Pulse Rate 09/12/20 1425 73     Resp 09/12/20 1425 17     Temp 09/12/20 1425 98.5 F (36.9 C)     Temp Source 09/12/20 1425 Oral     SpO2 09/12/20 1425 98 %     Weight --      Height --      Head Circumference --      Peak Flow --      Pain Score 09/12/20 1423 10     Pain Loc --      Pain Edu? --      Excl. in [place]? --    No data found.  Updated Vital Signs BP (!) 167/96 (BP Location: Left Arm)   Pulse 73   Temp 98.5 F (36.9 C) (Oral)   Resp 17   SpO2 98%   Visual Acuity Right Eye Distance:   Left Eye Distance:    Bilateral Distance:    Right Eye Near:   Left Eye Near:    Bilateral Near:     Physical Exam Vitals reviewed.  Constitutional:      General: He is not in acute distress.    Appearance: Normal appearance. He is not ill-appearing.  HENT:     Head: Normocephalic and atraumatic.  Jaw: Tenderness, swelling and pain on movement present. No malocclusion.     Salivary Glands: Right salivary gland is not diffusely enlarged or tender. Left salivary gland is not diffusely enlarged or tender.     Comments: L upper cheek mildly swollen    Right Ear: Hearing, tympanic membrane, ear canal and external ear normal. No decreased hearing noted. No drainage, swelling or tenderness. No middle ear effusion. There is no impacted cerumen. No foreign body. No mastoid tenderness. Tympanic membrane is not scarred, perforated, erythematous, retracted or bulging.     Left Ear: Hearing, tympanic membrane, ear canal and external ear normal. No decreased hearing noted. No drainage, swelling or tenderness.  No middle ear effusion. There is no impacted cerumen. No foreign body. No mastoid tenderness. Tympanic membrane is not scarred, perforated, erythematous, retracted or bulging.     Nose: Nose normal.     Mouth/Throat:     Lips: Pink.     Mouth: Mucous membranes are moist. No injury, lacerations, oral lesions or angioedema.     Dentition: Does not have dentures. Dental tenderness, gingival swelling and dental caries present. No dental abscesses or gum lesions.     Tongue: No lesions.     Palate: No mass and lesions.     Pharynx: No pharyngeal swelling, oropharyngeal exudate, posterior oropharyngeal erythema or uvula swelling.     Tonsils: No tonsillar exudate or tonsillar abscesses.     Comments: L upper cheek/gums with mild swelling and erythema. No discharge or abscess visualized. Pt wears dentures (not in place today) and does not have any teeth on top. Cardiovascular:     Rate and Rhythm: Normal rate and  regular rhythm.     Heart sounds: Normal heart sounds.  Pulmonary:     Effort: Pulmonary effort is normal.     Breath sounds: Normal breath sounds.  Neurological:     General: No focal deficit present.     Mental Status: He is alert and oriented to person, place, and time.  Psychiatric:        Mood and Affect: Mood normal.        Behavior: Behavior normal.        Thought Content: Thought content normal.        Judgment: Judgment normal.      UC Treatments / Results  Labs (all labs ordered are listed, but only abnormal results are displayed) Labs Reviewed - No data to display  EKG   Radiology No results found.  Procedures Procedures (including critical care time)  Medications Ordered in UC Medications - No data to display  Initial Impression / Assessment and Plan / UC Course  I have reviewed the triage vital signs and the nursing notes.  Pertinent labs & imaging results that were available during my care of the patient were reviewed by me and considered in my medical decision making (see chart for details).     Pt is afebrile and nontachycardic. Lidocaine mouthwash as below for dental pain, and he can also use ibuprofen 800mg  up to 4x daily with food. augmentin for dental infection. Return precautions- chest pain, shortness of breath, new/worsening fevers/chills, confusion, worsening of symptoms despite the above treatment plan, etc.    Final Clinical Impressions(s) / UC Diagnoses   Final diagnoses:  Pain, dental  Dental infection     Discharge Instructions     For your dental pain, start the antibiotic (Augmentin). Take this twice daily for 7 days. Also use the Lidocaine mouthwash as  needed for pain, up to every 4 hours. Gargle the mouthwash and spit it out. Make sure not to eat anything within 1 hour of gargling mouthwash.   Seek additional medical attention if you develop worsening dental pain, mouth swelling, fevers, shortness of breath, chest pain, etc.      ED Prescriptions    Medication Sig Dispense Auth. Provider   amoxicillin-clavulanate (AUGMENTIN) 875-125 MG tablet Take 1 tablet by mouth every 12 (twelve) hours. 14 tablet Marin Roberts E, PA-C   lidocaine (XYLOCAINE) 2 % solution Use as directed 15 mLs in the mouth or throat every 4 (four) hours as needed for up to 7 days for mouth pain (Gargle and spit out every 4 hours as needed). 650 mL Hazel Sams, PA-C     PDMP not reviewed this encounter.   Hazel Sams, PA-C 09/12/20 1523

## 2020-09-13 MED FILL — DICLOFENAC SODIUM 1% GEL: 1 | 6 days supply | Qty: 100 | Fill #2

## 2020-09-25 MED FILL — ?ATORVASTATIN 10 MG TABLET: 10 | 30 days supply | Qty: 30 | Fill #1

## 2020-09-25 MED FILL — PANTOPRAZOLE SOD DR 40 MG T: 40 | 30 days supply | Qty: 30 | Fill #1

## 2020-09-25 MED FILL — LOSARTAN POTASSIUM 50 MG TA: 50 | 30 days supply | Qty: 30 | Fill #1

## 2020-09-25 MED FILL — LEVOTHYROXINE SODIUM 175 MC: 175 | 30 days supply | Qty: 30 | Fill #1

## 2020-09-25 MED FILL — AMLODIPINE BESYLATE 5 MG TA: 5 | 30 days supply | Qty: 30 | Fill #10

## 2020-10-24 MED FILL — ?ATORVASTATIN 10 MG TABLET: 10 | 30 days supply | Qty: 30 | Fill #2

## 2020-10-24 MED FILL — LEVOTHYROXINE SODIUM 175 MC: 175 | 30 days supply | Qty: 30 | Fill #2

## 2020-10-24 MED FILL — LOSARTAN POTASSIUM 50 MG TA: 50 | 30 days supply | Qty: 30 | Fill #2

## 2020-10-24 MED FILL — PANTOPRAZOLE SOD DR 40 MG T: 40 | 30 days supply | Qty: 30 | Fill #2

## 2020-10-24 MED FILL — DICLOFENAC SODIUM 1% GEL: 1 | 6 days supply | Qty: 100 | Fill #3

## 2020-10-24 MED FILL — AMLODIPINE BESYLATE 5 MG TA: 5 | 30 days supply | Qty: 30 | Fill #11

## 2020-11-26 ENCOUNTER — Other Ambulatory Visit: Payer: Self-pay | Admitting: Family

## 2020-11-26 DIAGNOSIS — E782 Mixed hyperlipidemia: Secondary | ICD-10-CM

## 2020-11-26 DIAGNOSIS — E039 Hypothyroidism, unspecified: Secondary | ICD-10-CM

## 2020-11-26 DIAGNOSIS — I10 Essential (primary) hypertension: Secondary | ICD-10-CM

## 2020-11-26 MED FILL — DICLOFENAC SODIUM 1% GEL: 1 | 6 days supply | Qty: 100 | Fill #4

## 2020-11-26 MED FILL — AMLODIPINE BESYLATE 5 MG TA: 5 | 30 days supply | Qty: 30 | Fill #0

## 2020-11-26 MED FILL — PANTOPRAZOLE SOD DR 40 MG T: 40 | 30 days supply | Qty: 30 | Fill #0

## 2020-11-29 ENCOUNTER — Other Ambulatory Visit: Payer: Self-pay | Admitting: Pharmacist

## 2020-11-29 ENCOUNTER — Other Ambulatory Visit: Payer: Self-pay | Admitting: Family

## 2020-11-29 DIAGNOSIS — I10 Essential (primary) hypertension: Secondary | ICD-10-CM

## 2020-11-29 DIAGNOSIS — E039 Hypothyroidism, unspecified: Secondary | ICD-10-CM

## 2020-11-29 MED ORDER — LOSARTAN POTASSIUM 50 MG PO TABS: 50.0000 mg | ORAL_TABLET | Freq: Every day | ORAL | 0 refills | Status: DC

## 2020-11-29 MED ORDER — LEVOTHYROXINE SODIUM 175 MCG PO TABS: 175.0000 ug | ORAL_TABLET | Freq: Every day | ORAL | 0 refills | Status: DC

## 2020-11-29 MED FILL — LOSARTAN POTASSIUM 50 MG TA: 50 | 7 days supply | Qty: 7 | Fill #0

## 2020-11-29 MED FILL — LEVOTHYROXINE SODIUM 175 MC: 175 | 30 days supply | Qty: 30 | Fill #0

## 2020-12-04 ENCOUNTER — Other Ambulatory Visit: Payer: Self-pay | Admitting: Family

## 2020-12-04 DIAGNOSIS — E782 Mixed hyperlipidemia: Secondary | ICD-10-CM

## 2020-12-08 NOTE — Progress Notes (Unsigned)
Subjective:    Patient ID: Darren [name], male    DOB: 11/12/1960, 60 y.o.   MRN: [id number] Virtual Visit via Telephone Note  I connected with Darren [name] on 12/09/20 at 10:00 AM EDT by telephone and verified that I am speaking with the correct person using two identifiers.   Consent:  I discussed the limitations, risks, security and privacy concerns of performing an evaluation and management service by telephone and the availability of in person appointments. I also discussed with the patient that there may be a patient responsible charge related to this service. The patient expressed understanding and agreed to proceed.  Location of patient: Patient's at home  Location of provider: I am in my office at home  Persons participating in the televisit with the patient.   No one else on the call    History of Present Illness: 60 y.o.M ref by PCP Wynetta Emery  Needs refills Reva Bores  12/09/20 Last seen by Minette Brine NP Fellow 07/2020  Below was assessments at the last visit Essential hypertension: - Continue Amlodipine and Losartan as prescribed.  - BMP to check kidney function and electrolyte balance.  - Counseled on blood pressure goal of less than 130/80, low-sodium, DASH diet, medication compliance, 150 minutes of moderate intensity exercise per week as tolerated. Discussed medication compliance, adverse effects. - Follow-up with primary physician in 3 months or sooner if needed. - Basic Metabolic Panel - amLODipine (NORVASC) 5 MG tablet; Take 1 tablet (5 mg total) by mouth daily.  Dispense: 30 tablet; Refill: 2 - losartan (COZAAR) 50 MG tablet; Take 1 tablet (50 mg total) by mouth daily.  Dispense: 30 tablet; Refill: 2  2. Mixed hyperlipidemia: - Practice low-fat heart healthy diet and at least 150 minutes of moderate intensity exercise weekly as tolerated.  - Continue Atorvastatin as prescribed.  - Last lipid panel obtained 12/06/2019. - Follow-up with primary physician as  scheduled. - atorvastatin (LIPITOR) 10 MG tablet; Take 1 tablet (10 mg total) by mouth daily.  Dispense: 30 tablet; Refill: 2  3. Hypothyroidism, unspecified type: - Continue Levothyroxine as prescribed. - Last TSH obtained July 2021. - Follow-up with primary physician as scheduled. - levothyroxine (SYNTHROID) 175 MCG tablet; Take 1 tablet (175 mcg total) by mouth daily.  Dispense: 30 tablet; Refill: 2  4. Gastroesophageal reflux disease, unspecified whether esophagitis present: - Continue Pantoprazole as prescribed. - Follow-up with primary physician as scheduled. - pantoprazole (PROTONIX) 40 MG tablet; Take 1 tablet (40 mg total) by mouth daily.  Dispense: 30 tablet; Refill: 2  Patient is seen today by way of a telephone visit.  Patient was extremely upset that his appointment was switched to virtual.  I told him it was because I was ill and could not work from the office today.  Dr. Wynetta Emery is out.  Patient has had challenges getting medications refill and ran out of his Synthroid and atorvastatin and amlodipine refills.  He was given a short-term supply of these medications today he is completely out of his atorvastatin cannot get it filled on this today.  Patient states he is under a great deal of stress.  With rising gas prices it makes it difficult for him to come to the office on a frequent basis.  He also works nights and is day off rotates from week to week.  This is why he was upset he could not have it in person visit today.  He does have a need to have his liver function and  his cholesterol checked he is due for both of these.  Patient states he would prefer to come back for a face-to-face visit and have labs done at the same time begin saving him frequent trips back and forth to the office because of fuel prices     Past Medical History:  Diagnosis Date  . Arthritis    per Medical clearance form.  . Ascending aortic aneurysm ([place]) 06/2016   4.2 cm  . Barrett esophagus  2001   path report in EPIC 2001 c/w Barrett's. endoscopy report unavailable.  Marland Kitchen COPD (chronic obstructive pulmonary disease) ([place])    per medical clearance form.  . Depression    per Medical Clearance form(09/18/11)  . GERD (gastroesophageal reflux disease)    per Medical clearance form.  . Hypertension    Per Medical Clearance form.  . Hypothyroidism    per Medical Clearance form.     Family History  Problem Relation Age of Onset  . Cancer Father        lung  . Stroke Father   . Heart attack Father 48  . Lung cancer Mother   . Thyroid disease Sister   . Colon cancer Neg Hx   . Esophageal cancer Neg Hx      Social History   Socioeconomic History  . Marital status: Divorced    Spouse name: Not on file  . Number of children: 2  . Years of education: Not on file  . Highest education level: Not on file  Occupational History  . Not on file  Tobacco Use  . Smoking status: Former Smoker    Packs/day: 1.00    Years: 26.00    Pack years: 26.00    Types: Cigarettes    Quit date: 09/15/2004    Years since quitting: 16.2  . Smokeless tobacco: Never Used  . Tobacco comment: Quit in 2005  Substance and Sexual Activity  . Alcohol use: Yes    Comment: ON WEEKENDS  . Drug use: No  . Sexual activity: Not Currently    Birth control/protection: None  Other Topics Concern  . Not on file  Social History Narrative   Lives alone.     Social Determinants of Health   Financial Resource Strain: Not on file  Food Insecurity: Not on file  Transportation Needs: Not on file  Physical Activity: Not on file  Stress: Not on file  Social Connections: Not on file  Intimate Partner Violence: Not on file     No Known Allergies   Outpatient Medications Prior to Visit  Medication Sig Dispense Refill  . diclofenac Sodium (VOLTAREN) 1 % GEL APPLY 4 G TOPICALLY 4 (FOUR) TIMES DAILY. 100 g 5  . ondansetron (ZOFRAN) 8 MG tablet Take 1 tablet (8 mg total) by mouth every 8 (eight) hours as  needed for nausea or vomiting. 20 tablet 0  . amLODipine (NORVASC) 5 MG tablet Take 1 tablet (5 mg total) by mouth daily. 30 tablet 2  . atorvastatin (LIPITOR) 10 MG tablet Take 1 tablet (10 mg total) by mouth daily. 30 tablet 2  . levothyroxine (SYNTHROID) 175 MCG tablet Take 1 tablet (175 mcg total) by mouth daily. 30 tablet 0  . losartan (COZAAR) 50 MG tablet Take 1 tablet (50 mg total) by mouth daily. 7 tablet 0  . pantoprazole (PROTONIX) 40 MG tablet Take 1 tablet (40 mg total) by mouth daily. 30 tablet 2  . amoxicillin-clavulanate (AUGMENTIN) 875-125 MG tablet Take 1 tablet by mouth every  12 (twelve) hours. (Patient not taking: Reported on 12/09/2020) 14 tablet 0   No facility-administered medications prior to visit.      Review of Systems I did not perform a complete review of system as the patient was extremely upset and primarily wanted his medication issue solved with this phone call    Objective:   Physical Exam  No exam this is a phone visit      Assessment & Plan:  I personally reviewed all images and lab data in the [facility] system as well as any outside material available during this office visit and agree with the  radiology impressions.   HTN (hypertension)  I apologized the patient for not being able to be present with him today due to my illness  I will go ahead and get his amlodipine and losartan refilled the amlodipine he has a 30-day supply now I will give a 90 count for his next fill  He was only given 7 losartan's with the last fill I will go ahead and have 90 losartan's filled and sent to his home by mail  GERD (gastroesophageal reflux disease) Patient has a current supply of Protonix and I put future refills in the system  Hypothyroidism Patient does have a 30-day supply of Synthroid 175 mcg that he just picked up I will put in a 90 count refill for future  Hyperlipidemia I submitted a 90 count atorvastatin refill will have this mailed to his home he is  taking 10 mg daily  He will have lipid panel obtain liver function obtain when he comes to the office for his next visit face-to-face   Braxley was seen today for medication refill.  Diagnoses and all orders for this visit:  Essential hypertension -     amLODipine (NORVASC) 5 MG tablet; Take 1 tablet (5 mg total) by mouth daily. -     losartan (COZAAR) 50 MG tablet; Take 1 tablet (50 mg total) by mouth daily.  Mixed hyperlipidemia -     atorvastatin (LIPITOR) 10 MG tablet; Take 1 tablet (10 mg total) by mouth daily.  Hypothyroidism, unspecified type -     levothyroxine (SYNTHROID) 175 MCG tablet; Take 1 tablet (175 mcg total) by mouth daily.  Gastroesophageal reflux disease, unspecified whether esophagitis present -     pantoprazole (PROTONIX) 40 MG tablet; Take 1 tablet (40 mg total) by mouth daily.  Primary hypertension    Follow Up Instructions: Patient knows an in office exam will be scheduled so that he can have his examination done along with lab draws with Dr. Wynetta Emery in the next month he knows that his atorvastatin will be mailed to his home and note he only was given 7 losartan tablets with the current fill and so I am going to give him 90 losartan to have that mailed to his home as well  His other medications were refilled for future refills   I discussed the assessment and treatment plan with the patient. The patient was provided an opportunity to ask questions and all were answered. The patient agreed with the plan and demonstrated an understanding of the instructions.   The patient was advised to call back or seek an in-person evaluation if the symptoms worsen or if the condition fails to improve as anticipated.  I provided 30  minutes of non-face-to-face time during this encounter  including  median intraservice time , review of notes, labs, imaging, medications  and explaining diagnosis and management to the patient .  Asencion Noble, MD

## 2020-12-09 ENCOUNTER — Encounter: Payer: Self-pay | Admitting: Critical Care Medicine

## 2020-12-09 ENCOUNTER — Other Ambulatory Visit: Payer: Self-pay

## 2020-12-09 ENCOUNTER — Other Ambulatory Visit: Payer: Self-pay | Admitting: Critical Care Medicine

## 2020-12-09 ENCOUNTER — Ambulatory Visit: Payer: Self-pay | Attending: Critical Care Medicine | Admitting: Critical Care Medicine

## 2020-12-09 ENCOUNTER — Telehealth: Payer: Self-pay | Admitting: Internal Medicine

## 2020-12-09 DIAGNOSIS — E782 Mixed hyperlipidemia: Secondary | ICD-10-CM

## 2020-12-09 DIAGNOSIS — E785 Hyperlipidemia, unspecified: Secondary | ICD-10-CM | POA: Insufficient documentation

## 2020-12-09 DIAGNOSIS — I10 Essential (primary) hypertension: Secondary | ICD-10-CM

## 2020-12-09 DIAGNOSIS — K219 Gastro-esophageal reflux disease without esophagitis: Secondary | ICD-10-CM

## 2020-12-09 DIAGNOSIS — E039 Hypothyroidism, unspecified: Secondary | ICD-10-CM

## 2020-12-09 MED ORDER — AMLODIPINE BESYLATE 5 MG PO TABS: 5.0000 mg | ORAL_TABLET | Freq: Every day | ORAL | 2 refills | Status: DC

## 2020-12-09 MED ORDER — PANTOPRAZOLE SODIUM 40 MG PO TBEC: 40.0000 mg | DELAYED_RELEASE_TABLET | Freq: Every day | ORAL | 2 refills | Status: DC

## 2020-12-09 MED ORDER — LOSARTAN POTASSIUM 50 MG PO TABS: 50.0000 mg | ORAL_TABLET | Freq: Every day | ORAL | 0 refills | Status: DC

## 2020-12-09 MED ORDER — ATORVASTATIN CALCIUM 10 MG PO TABS: 10.0000 mg | ORAL_TABLET | Freq: Every day | ORAL | 2 refills | Status: DC

## 2020-12-09 MED ORDER — LEVOTHYROXINE SODIUM 175 MCG PO TABS
175.0000 ug | ORAL_TABLET | Freq: Every day | ORAL | 1 refills | Status: DC
Start: 2020-12-09 — End: 2020-12-09

## 2020-12-09 MED FILL — ?ATORVASTATIN 10 MG TABLET: 10 | 30 days supply | Qty: 30 | Fill #0

## 2020-12-09 NOTE — Assessment & Plan Note (Signed)
Patient has a current supply of Protonix and I put future refills in the system

## 2020-12-09 NOTE — Telephone Encounter (Signed)
Patient is calling about his appt. Scheduled for today at 10am.  He has been holding on the line and no one has called him.  Patient is upset because he stated he has a lot to do today and cannot hold on the phone for long waiting for his appt.  Please advise and call patient asap for his appt.

## 2020-12-09 NOTE — Assessment & Plan Note (Signed)
I submitted a 90 count atorvastatin refill will have this mailed to his home he is taking 10 mg daily  He will have lipid panel obtain liver function obtain when he comes to the office for his next visit face-to-face

## 2020-12-09 NOTE — Assessment & Plan Note (Signed)
Patient does have a 30-day supply of Synthroid 175 mcg that he just picked up I will put in a 90 count refill for future

## 2020-12-09 NOTE — Telephone Encounter (Signed)
The nurse and provider already spoke with the Pt for today appt

## 2020-12-09 NOTE — Assessment & Plan Note (Signed)
  I apologized the patient for not being able to be present with him today due to my illness  I will go ahead and get his amlodipine and losartan refilled the amlodipine he has a 30-day supply now I will give a 90 count for his next fill  He was only given 7 losartan's with the last fill I will go ahead and have 90 losartan's filled and sent to his home by mail

## 2020-12-28 ENCOUNTER — Other Ambulatory Visit: Payer: Self-pay

## 2021-01-01 ENCOUNTER — Other Ambulatory Visit: Payer: Self-pay

## 2021-01-01 MED FILL — Pantoprazole Sodium EC Tab 40 MG (Base Equiv): ORAL | 30 days supply | Qty: 30 | Fill #0 | Status: AC

## 2021-01-01 MED FILL — Diclofenac Sodium Gel 1% (1.16% Diethylamine Equiv): CUTANEOUS | 7 days supply | Qty: 100 | Fill #0 | Status: AC

## 2021-01-01 MED FILL — Levothyroxine Sodium Tab 175 MCG: ORAL | 30 days supply | Qty: 30 | Fill #0 | Status: AC

## 2021-01-01 MED FILL — Levothyroxine Sodium Tab 175 MCG: ORAL | 30 days supply | Qty: 30 | Fill #0 | Status: CN

## 2021-01-01 MED FILL — Amlodipine Besylate Tab 5 MG (Base Equivalent): ORAL | 30 days supply | Qty: 30 | Fill #0 | Status: AC

## 2021-01-15 ENCOUNTER — Other Ambulatory Visit: Payer: Self-pay

## 2021-01-15 MED FILL — Atorvastatin Calcium Tab 10 MG (Base Equivalent): ORAL | 30 days supply | Qty: 30 | Fill #0 | Status: AC

## 2021-01-15 MED FILL — Losartan Potassium Tab 50 MG: ORAL | 30 days supply | Qty: 30 | Fill #0 | Status: AC

## 2021-01-16 ENCOUNTER — Other Ambulatory Visit: Payer: Self-pay

## 2021-01-16 ENCOUNTER — Ambulatory Visit: Payer: Self-pay | Attending: Internal Medicine | Admitting: Internal Medicine

## 2021-01-16 ENCOUNTER — Encounter: Payer: Self-pay | Admitting: Internal Medicine

## 2021-01-16 VITALS — BP 122/72 | HR 87 | Resp 18 | Ht 64.0 in | Wt 185.4 lb

## 2021-01-16 DIAGNOSIS — Z125 Encounter for screening for malignant neoplasm of prostate: Secondary | ICD-10-CM

## 2021-01-16 DIAGNOSIS — I7121 Aneurysm of the ascending aorta, without rupture: Secondary | ICD-10-CM | POA: Insufficient documentation

## 2021-01-16 DIAGNOSIS — E782 Mixed hyperlipidemia: Secondary | ICD-10-CM

## 2021-01-16 DIAGNOSIS — I10 Essential (primary) hypertension: Secondary | ICD-10-CM

## 2021-01-16 DIAGNOSIS — E039 Hypothyroidism, unspecified: Secondary | ICD-10-CM

## 2021-01-16 DIAGNOSIS — E669 Obesity, unspecified: Secondary | ICD-10-CM

## 2021-01-16 DIAGNOSIS — I712 Thoracic aortic aneurysm, without rupture: Secondary | ICD-10-CM

## 2021-01-16 NOTE — Progress Notes (Signed)
Patient ID: Darren [name], male    DOB: Aug 31, 1961  MRN: [id number]  CC: 1 month follow up    Subjective: Darren [name] is a 60 y.o. male who presents for chronic ds management His concerns today include:  Pt with hx of GERD, hypothyroid,HTN,HL,chronic LBP and Barrett's esophagus. Hx of COVID infection  Last spoke with Dr. Joya Gaskins 11/2020.  He was having some difficulty communicating with pharmacy to get refills.  Dr. Joya Gaskins wrote for 90 day supply on meds. Wants to get his OC reinstated.   HTN:  Compliant with Norvasc and Cozaar. Limits salt in foods No CP/SOB?LE edema  Hypothyroid:  Compliant with Levothyroxine.  No unexplained weight changes.  No feeling hot or cold all the time.  HL:  compliant Lipitor and tolerating the medication okay.  Obesity: Weight is up 6 pounds since November of last year.  He reports his eating habits is not the best.  He drinks a lot of sodas and sweet tea.  A little bit more active at work.  A note in his chart that he had a CT of the chest done 06/2016 that revealed an ascending thoracic aorta measuring 4.2 cm.  Radiologist would recommend semiannual imaging by CTA or MRA.  He denies any chest pains at this time.   Patient Active Problem List   Diagnosis Date Noted  . Hyperlipidemia 12/09/2020  . Lipoma of neck 11/24/2019  . Class 1 obesity due to excess calories without serious comorbidity with body mass index (BMI) of 31.0 to 31.9 in adult 11/04/2017  . Disorder of left sacroiliac joint 12/21/2016  . Spondylosis without myelopathy or radiculopathy, lumbar region 12/21/2016  . Lumbar degenerative disc disease 12/21/2016  . 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 HTN (hypertension) 07/31/2016  . 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 Esophageal dysphagia 07/09/2016  . Chronic midline low back pain 07/09/2016  . Sensorineural hearing loss (SNHL), bilateral 03/30/2016  . Vitamin D deficiency  04/06/2014  . Hypothyroidism 10/31/2013  . GERD (gastroesophageal reflux disease) 11/25/2011  . Barrett esophagus 09/29/1999     Current Outpatient Medications on File Prior to Visit  Medication Sig Dispense Refill  . amLODipine (NORVASC) 5 MG tablet TAKE 1 TABLET (5 MG TOTAL) BY MOUTH DAILY. 90 tablet 2  . atorvastatin (LIPITOR) 10 MG tablet TAKE 1 TABLET (10 MG TOTAL) BY MOUTH DAILY. 90 tablet 2  . diclofenac Sodium (VOLTAREN) 1 % GEL APPLY 4 G TOPICALLY 4 (FOUR) TIMES DAILY. 100 g 5  . levothyroxine (SYNTHROID) 175 MCG tablet TAKE 1 TABLET (175 MCG TOTAL) BY MOUTH DAILY. 90 tablet 1  . lidocaine (XYLOCAINE) 2 % solution USE AS DIRECTED 15 MLS IN THE MOUTH OR THROAT EVERY 4 HOURS AS NEEDED FOR UP TO 7 DAYS FOR MOUTH PAIN (GARGLE AND SPIT OUT EVERY 4 HOURS AS NEEDED) 650 mL 0  . losartan (COZAAR) 50 MG tablet TAKE 1 TABLET (50 MG TOTAL) BY MOUTH DAILY. 90 tablet 0  . ondansetron (ZOFRAN) 8 MG tablet Take 1 tablet (8 mg total) by mouth every 8 (eight) hours as needed for nausea or vomiting. 20 tablet 0  . pantoprazole (PROTONIX) 40 MG tablet TAKE 1 TABLET (40 MG TOTAL) BY MOUTH DAILY. 30 tablet 2   No current facility-administered medications on file prior to visit.    No Known Allergies  Social History   Socioeconomic History  . Marital status: Divorced    Spouse name: Not on file  .  Number of children: 2  . Years of education: Not on file  . Highest education level: Not on file  Occupational History  . Not on file  Tobacco Use  . Smoking status: Former Smoker    Packs/day: 1.00    Years: 26.00    Pack years: 26.00    Types: Cigarettes    Quit date: 09/15/2004    Years since quitting: 16.3  . Smokeless tobacco: Never Used  . Tobacco comment: Quit in 2005  Substance and Sexual Activity  . Alcohol use: Yes    Comment: ON WEEKENDS  . Drug use: No  . Sexual activity: Not Currently    Birth control/protection: None  Other Topics Concern  . Not on file  Social History  Narrative   Lives alone.     Social Determinants of Health   Financial Resource Strain: Not on file  Food Insecurity: Not on file  Transportation Needs: Not on file  Physical Activity: Not on file  Stress: Not on file  Social Connections: Not on file  Intimate Partner Violence: Not on file    Family History  Problem Relation Age of Onset  . Cancer Father        lung  . Stroke Father   . Heart attack Father 4  . Lung cancer Mother   . Thyroid disease Sister   . Colon cancer Neg Hx   . Esophageal cancer Neg Hx     Past Surgical History:  Procedure Laterality Date  . BIOPSY  09/18/2016   Procedure: BIOPSY;  Surgeon: Daneil Dolin, MD;  [facility];  Service: Endoscopy;;  biopsy of distal esophagus  . ESOPHAGOGASTRODUODENOSCOPY  2001   barrett's per path  . ESOPHAGOGASTRODUODENOSCOPY (EGD) WITH PROPOFOL N/A 09/18/2016   Procedure: ESOPHAGOGASTRODUODENOSCOPY (EGD) WITH PROPOFOL;  Surgeon: Daneil Dolin, MD;  Location: [facility];  Service: Endoscopy;  Laterality: N/A;  1200  . [place]  . MULTIPLE EXTRACTIONS WITH ALVEOLOPLASTY  11/30/2011   Procedure: MULTIPLE EXTRACION WITH ALVEOLOPLASTY;  Surgeon: Gae Bon, DDS;  Location: [place];  Service: Oral Surgery;  Laterality: Bilateral;    ROS: Review of Systems Negative except as stated above  PHYSICAL EXAM: BP 122/72   Pulse 87   Resp 18   Ht 5\' 4"  (1.626 m)   Wt 185 lb 6.4 oz (84.1 kg)   SpO2 95%   BMI 31.82 kg/m   Wt Readings from Last 3 Encounters:  01/16/21 185 lb 6.4 oz (84.1 kg)  08/06/20 179 lb (81.2 kg)  03/28/20 182 lb (82.6 kg)    Physical Exam General appearance - alert, well appearing, older Caucasian male and in no distress Mental status - normal mood, behavior, speech, dress, motor activity, and thought processes Neck - supple, no significant adenopathy Chest - clear to auscultation, no wheezes, rales or rhonchi, symmetric air entry Heart - normal rate,  regular rhythm, normal S1, S2, no murmurs, rubs, clicks or gallops Extremities - peripheral pulses normal, no pedal edema, no clubbing or cyanosis  CMP Latest Ref Rng & Units 08/06/2020 03/28/2020 12/06/2019  Glucose 65 - 99 mg/dL 90 135(H) 113(H)  BUN 6 - 24 mg/dL 16 19 20   Creatinine 0.76 - 1.27 mg/dL 0.77 0.82 0.70(L)  Sodium 134 - 144 mmol/L 136 138 139  Potassium 3.5 - 5.2 mmol/L 4.0 4.2 4.4  Chloride 96 - 106 mmol/L 100 103 104  CO2 20 - 29 mmol/L 24 23 20  Calcium 8.7 - 10.2 mg/dL 9.6 9.3 9.4  Total Protein 6.0 - 8.5 g/dL - - 6.6  Total Bilirubin 0.0 - 1.2 mg/dL - - 0.5  Alkaline Phos 39 - 117 IU/L - - 81  AST 0 - 40 IU/L - - 28  ALT 0 - 44 IU/L - - 38   Lipid Panel     Component Value Date/Time   CHOL 148 12/06/2019 0838   TRIG 117 12/06/2019 0838   HDL 47 12/06/2019 0838   CHOLHDL 3.1 12/06/2019 0838   CHOLHDL 4.1 10/31/2013 1633   VLDL 34 10/31/2013 1633   LDLCALC 80 12/06/2019 0838    CBC    Component Value Date/Time   WBC 15.4 (H) 12/06/2019 0838   WBC 9.9 11/23/2016 1331   RBC 5.23 12/06/2019 0838   RBC 5.14 11/23/2016 1331   HGB 14.7 12/06/2019 0838   HCT 42.8 12/06/2019 0838   PLT 173 12/06/2019 0838   MCV 82 12/06/2019 0838   MCH 28.1 12/06/2019 0838   MCH 27.8 11/23/2016 1331   MCHC 34.3 12/06/2019 0838   MCHC 34.7 11/23/2016 1331   RDW 13.1 12/06/2019 0838   LYMPHSABS 5.3 (H) 11/23/2016 1331   MONOABS 0.7 11/23/2016 1331   EOSABS 0.3 11/23/2016 1331   BASOSABS 0.0 11/23/2016 1331    ASSESSMENT AND PLAN: 1. Essential hypertension At goal.  Continue current medications and low-salt diet. - CBC  2. Ascending aortic aneurysm ([place]) I discussed this finding with the patient and the fact that we need to follow-up on this with advanced imaging.  However he is currently uninsured and is not sure whether he would be able to get the orange card/cone discount because he is making a little bit more at his job.  I informed him to go ahead and reapply anyway  for the orange card/cone discount and once approved we can go ahead and order the study.  3. Mixed hyperlipidemia Continue atorvastatin - Lipid panel - Hepatic Function Panel  4. Acquired hypothyroidism Continue levothyroxine.  Due for level check - TSH  5. Obesity (BMI 30.0-34.9) Discussed and encourage healthy eating habits.  Encouraged him to eliminate sugary drinks from his diet and to drink more water instead.  Encouraged to eat 2-3 servings of fruits every day and to incorporate fresh vegetables into his diet daily  6. Prostate cancer screening Discussed prostate cancer screening.  He is agreeable to having his PSA level checked - PSA   Patient was given the opportunity to ask questions.  Patient verbalized understanding of the plan and was able to repeat key elements of the plan.   No orders of the defined types were placed in this encounter.    Requested Prescriptions    No prescriptions requested or ordered in this encounter    No follow-ups on file.  Karle Plumber, MD, FACP

## 2021-01-16 NOTE — Patient Instructions (Addendum)
Please reapply for the orange card/cone discount card.  Once approved, please let me know so that we can order the CAT scan of your chest to follow-up on the aneurysm that was seen on CAT scan back in 2017.  You should eliminate sugary drinks from your diet.  This includes regular sodas, juices and sweet tea.  Try to drink more water.  Try to eat several servings of fresh fruits daily.    Thoracic Aortic Aneurysm  An aneurysm is a bulge in an artery. It happens when blood pushes up against a weakened or damaged artery wall. A thoracic aortic aneurysm is an aneurysm that occurs in the first part of the aorta, between the heart and the diaphragm. The aorta is the main artery of the body. It supplies blood from the heart to the rest of the body. Some aneurysms may not cause symptoms or problems. However, a thoracic aortic aneurysm can cause two serious problems:  It can enlarge and burst (rupture).  It can cause blood to flow between the layers of the wall of the aorta through a tear (aortic dissection). Both of these problems are medical emergencies. They can cause bleeding inside the body and can be life-threatening if they are not diagnosed and treated right away. What are the causes? The exact cause of this condition is not known. What increases the risk? The following factors may make you more likely to develop this condition:  Being 11 years of age or older.  Having a family history of aneurysms.  Using tobacco.  Having any of these conditions: ? Hardening of the arteries caused by the buildup of fat and other substances in the lining of a blood vessel (arteriosclerosis). ? Inflammation of the walls of an artery (arteritis). ? A genetic disease that weakens the body's connective tissue, such as Marfan syndrome. ? An injury or trauma to the aorta. ? High blood pressure (hypertension). ? High cholesterol. ? An infection from bacteria, such as syphilis or staphylococcus, in the wall  of the aorta (infectious aortitis). What are the signs or symptoms? Symptoms of this condition vary depending on the size of the aneurysm and how fast it is growing. Most grow slowly and do not cause symptoms. When symptoms do occur, they may include:  Pain in the chest, back, sides, or abdomen. The pain may vary in intensity. Sudden, severe pain may indicate that the aneurysm has ruptured.  Hoarseness.  Cough.  Shortness of breath.  Swallowing problems.  Swelling in the face, arms, or legs.  Fever.  Unexplained weight loss. How is this diagnosed? This condition may be diagnosed with:  An ultrasound.  X-rays.  CT scan.  MRI.  A test to check the arteries for damage or blockages (angiogram). Most unruptured thoracic aortic aneurysms cause no symptoms, so they are often found during exams for other conditions. How is this treated? Treatment for this condition depends on:  The size of the aneurysm.  How fast the aneurysm is growing.  Your age.  Risk factors for rupture. Small aneurysms (2.2 inches, or 5.5 cm, or less) may be managed with:  Medicines to: ? Control blood pressure. ? Manage pain. ? Fight infection.  Regular monitoring. This may include an ultrasound or CT scan every year or every 6 months to see if the aneurysm is getting bigger. Large or fast-growing aneurysms may be treated with surgery. Follow these instructions at home: Eating and drinking  Eat a healthy diet. Your health care provider may recommend  that you: ? Lower your salt (sodium) intake. In some people, too much salt can raise blood pressure and increase the risk for thoracic aortic aneurysm. ? Avoid foods that are high in saturated fat and cholesterol, such as red meat and full-fat dairy. ? Eat a diet that is low in sugar. ? Increase your fiber intake by including whole grains, vegetables, and fruits in your diet. Eating these foods may help to lower your blood pressure.  Do not drink  alcohol if your health care provider tells you not to drink.  If you drink alcohol: ? Limit how much you use to:  0-1 drink a day for women.  0-2 drinks a day for men. ? Be aware of how much alcohol is in your drink. In the [place] one drink equals one 12 oz bottle of beer (355 mL), one 5 oz glass of wine (148 mL), or one 1 oz glass of hard liquor (44 mL).   Lifestyle  Do not use any products that contain nicotine or tobacco, such as cigarettes, e-cigarettes, and chewing tobacco. If you need help quitting, ask your health care provider.  Maintain a healthy weight.  Check your blood pressure regularly. Follow your health care provider's instructions on how to keep your blood pressure within normal limits.  Have your blood sugar (glucose) level and cholesterol levels checked regularly. Follow your health care provider's instructions on how to keep levels within normal limits. Activity  Stay physically active and exercise regularly. Talk with your health care provider about how often you should exercise and ask which types of exercise are best for you.  Avoid heavy lifting and activities that take a lot of effort. Ask your health care provider what activities are safe for you.   General instructions  Take over-the-counter and prescription medicines only as told by your health care provider.  Talk with your health care provider about regular screenings to see if the aneurysm is getting bigger.  Keep all follow-up visits as told by your health care provider. This is important. Contact a health care provider if you have:  Unexplained weight loss. Get help right away if you have:  Pain in your upper back, neck, or abdomen. This pain may move into your chest and arms.  Trouble swallowing.  A cough or hoarseness.  Shortness of breath. Summary  A thoracic aortic aneurysm is an aneurysm that occurs in the first part of the aorta, between the heart and the diaphragm.  As a thoracic  aortic aneurysm becomes larger, it can burst (rupture), or blood can flow between the layers of the wall of the aorta through a tear (aorticdissection). These conditions can be life-threatening if they are not diagnosed and treated right away.  If you have a thoracic aortic aneurysm, its growth will be closely monitored. Surgical repair may be needed for larger or faster-growing aneurysms. This information is not intended to replace advice given to you by your health care provider. Make sure you discuss any questions you have with your health care provider. Document Revised: 05/03/2018 Document Reviewed: 05/04/2018 Elsevier Patient Education  [place].

## 2021-01-17 LAB — LIPID PANEL
Chol/HDL Ratio: 3.6 ratio (ref 0.0–5.0)
Cholesterol, Total: 161 mg/dL (ref 100–199)
HDL: 45 mg/dL (ref >39)
LDL Chol Calc (NIH): 91 mg/dL (ref 0–99)
Triglycerides: 141 mg/dL (ref 0–149)
VLDL Cholesterol Cal: 25 mg/dL (ref 5–40)

## 2021-01-17 LAB — CBC
Hematocrit: 44.3 % (ref 37.5–51.0)
Hemoglobin: 15.1 g/dL (ref 13.0–17.7)
MCH: 27.5 pg (ref 26.6–33.0)
MCHC: 34.1 g/dL (ref 31.5–35.7)
MCV: 81 fL (ref 79–97)
Platelets: 200 10*3/uL (ref 150–450)
RBC: 5.5 x10E6/uL (ref 4.14–5.80)
RDW: 13.5 % (ref 11.6–15.4)
WBC: 15.4 10*3/uL — ABNORMAL HIGH (ref 3.4–10.8)

## 2021-01-17 LAB — PSA: Prostate Specific Ag, Serum: 1.9 ng/mL (ref 0.0–4.0)

## 2021-01-17 LAB — HEPATIC FUNCTION PANEL
ALT: 29 IU/L (ref 0–44)
AST: 20 IU/L (ref 0–40)
Albumin: 4.6 g/dL (ref 3.8–4.9)
Alkaline Phosphatase: 90 IU/L (ref 44–121)
Bilirubin Total: 0.3 mg/dL (ref 0.0–1.2)
Bilirubin, Direct: <0.1 mg/dL (ref 0.00–0.40)
Total Protein: 6.6 g/dL (ref 6.0–8.5)

## 2021-01-17 LAB — TSH: TSH: 0.817 u[IU]/mL (ref 0.450–4.500)

## 2021-01-17 NOTE — Progress Notes (Signed)
Let patient know that his white blood cell counts are elevated but stable compared to 1 year ago.  I would like to refer him to a blood specialist.  Please apply for the orange card/cone discount card and once approved let me know so that I can submit the referral.  Cholesterol level good.  Thyroid level normal.  PSA is normal.  This is the screening test for prostate cancer.  Liver function tests normal.

## 2021-01-20 ENCOUNTER — Telehealth: Payer: Self-pay

## 2021-01-20 NOTE — Telephone Encounter (Signed)
Contacted pt to go over lab results pt didn't answer and was unable to lvm due to vm being full  

## 2021-01-30 ENCOUNTER — Other Ambulatory Visit: Payer: Self-pay

## 2021-01-30 ENCOUNTER — Telehealth: Payer: Self-pay | Admitting: Internal Medicine

## 2021-01-30 ENCOUNTER — Other Ambulatory Visit: Payer: Self-pay | Admitting: Internal Medicine

## 2021-01-30 DIAGNOSIS — M545 Low back pain, unspecified: Secondary | ICD-10-CM

## 2021-01-30 DIAGNOSIS — M25522 Pain in left elbow: Secondary | ICD-10-CM

## 2021-01-30 DIAGNOSIS — G8929 Other chronic pain: Secondary | ICD-10-CM

## 2021-01-30 MED ORDER — DICLOFENAC SODIUM 1 % EX GEL
4.0000 g | Freq: Four times a day (QID) | CUTANEOUS | 5 refills | Status: DC
  Filled 2021-01-30: qty 100, 7d supply, fill #0
  Filled 2021-03-03: qty 100, 7d supply, fill #1
  Filled 2021-04-23: qty 100, 7d supply, fill #2
  Filled 2021-05-22: qty 100, 7d supply, fill #3
  Filled 2021-06-23: qty 100, 7d supply, fill #4
  Filled 2021-07-21: qty 100, 7d supply, fill #5

## 2021-01-30 MED FILL — Levothyroxine Sodium Tab 175 MCG: ORAL | 30 days supply | Qty: 30 | Fill #1 | Status: AC

## 2021-01-30 MED FILL — Amlodipine Besylate Tab 5 MG (Base Equivalent): ORAL | 30 days supply | Qty: 30 | Fill #1 | Status: AC

## 2021-01-30 MED FILL — Pantoprazole Sodium EC Tab 40 MG (Base Equiv): ORAL | 30 days supply | Qty: 30 | Fill #1 | Status: AC

## 2021-01-30 NOTE — Telephone Encounter (Signed)
Pt had Fin. Assistance before and expired but Pt  would now like to Speak w/ Financial Counselor instead to inquire of new job and new $3 dollar pay raise to see if he might still qualify for Financial Assistance to pick up application for Appt or if he's making too much money now and would not qualify. Pt wants to hear answer from [employer]. Counselor. Please advise and thank you

## 2021-01-30 NOTE — Telephone Encounter (Signed)
I return Pt call, I LVM inform him until we don't do the number we are not sure if he can qualified or not, my advice to him is to schedule a financial appt

## 2021-02-17 ENCOUNTER — Other Ambulatory Visit: Payer: Self-pay

## 2021-02-17 MED FILL — Losartan Potassium Tab 50 MG: ORAL | 30 days supply | Qty: 30 | Fill #1 | Status: AC

## 2021-02-17 MED FILL — Atorvastatin Calcium Tab 10 MG (Base Equivalent): ORAL | 30 days supply | Qty: 30 | Fill #1 | Status: AC

## 2021-03-03 ENCOUNTER — Other Ambulatory Visit: Payer: Self-pay

## 2021-03-03 MED FILL — Pantoprazole Sodium EC Tab 40 MG (Base Equiv): ORAL | 30 days supply | Qty: 30 | Fill #2 | Status: AC

## 2021-03-03 MED FILL — Amlodipine Besylate Tab 5 MG (Base Equivalent): ORAL | 30 days supply | Qty: 30 | Fill #2 | Status: AC

## 2021-03-03 MED FILL — Levothyroxine Sodium Tab 175 MCG: ORAL | 30 days supply | Qty: 30 | Fill #2 | Status: AC

## 2021-03-04 ENCOUNTER — Other Ambulatory Visit: Payer: Self-pay

## 2021-03-05 ENCOUNTER — Other Ambulatory Visit: Payer: Self-pay

## 2021-03-06 ENCOUNTER — Other Ambulatory Visit: Payer: Self-pay

## 2021-03-18 ENCOUNTER — Other Ambulatory Visit: Payer: Self-pay

## 2021-03-18 ENCOUNTER — Other Ambulatory Visit: Payer: Self-pay | Admitting: Critical Care Medicine

## 2021-03-18 DIAGNOSIS — I10 Essential (primary) hypertension: Secondary | ICD-10-CM

## 2021-03-18 MED ORDER — LOSARTAN POTASSIUM 50 MG PO TABS
ORAL_TABLET | Freq: Every day | ORAL | 0 refills | Status: DC
  Filled 2021-03-18: qty 30, 30d supply, fill #0
  Filled 2021-04-23: qty 30, 30d supply, fill #1
  Filled 2021-05-22: qty 30, 30d supply, fill #2

## 2021-03-18 MED FILL — Atorvastatin Calcium Tab 10 MG (Base Equivalent): ORAL | 30 days supply | Qty: 30 | Fill #2 | Status: AC

## 2021-03-19 ENCOUNTER — Other Ambulatory Visit: Payer: Self-pay

## 2021-03-21 ENCOUNTER — Other Ambulatory Visit: Payer: Self-pay

## 2021-04-01 ENCOUNTER — Other Ambulatory Visit: Payer: Self-pay

## 2021-04-01 ENCOUNTER — Other Ambulatory Visit: Payer: Self-pay | Admitting: Critical Care Medicine

## 2021-04-01 DIAGNOSIS — K219 Gastro-esophageal reflux disease without esophagitis: Secondary | ICD-10-CM

## 2021-04-01 MED ORDER — PANTOPRAZOLE SODIUM 40 MG PO TBEC
DELAYED_RELEASE_TABLET | Freq: Every day | ORAL | 2 refills | Status: DC
  Filled 2021-04-01: qty 30, 30d supply, fill #0
  Filled 2021-04-30: qty 30, 30d supply, fill #1
  Filled 2021-05-30: qty 30, 30d supply, fill #2

## 2021-04-01 MED FILL — Amlodipine Besylate Tab 5 MG (Base Equivalent): ORAL | 30 days supply | Qty: 30 | Fill #3 | Status: AC

## 2021-04-01 MED FILL — Levothyroxine Sodium Tab 175 MCG: ORAL | 30 days supply | Qty: 30 | Fill #3 | Status: AC

## 2021-04-23 ENCOUNTER — Other Ambulatory Visit: Payer: Self-pay

## 2021-04-23 MED FILL — Atorvastatin Calcium Tab 10 MG (Base Equivalent): ORAL | 30 days supply | Qty: 30 | Fill #3 | Status: AC

## 2021-04-30 ENCOUNTER — Other Ambulatory Visit: Payer: Self-pay

## 2021-04-30 MED FILL — Amlodipine Besylate Tab 5 MG (Base Equivalent): ORAL | 30 days supply | Qty: 30 | Fill #4 | Status: AC

## 2021-04-30 MED FILL — Levothyroxine Sodium Tab 175 MCG: ORAL | 30 days supply | Qty: 30 | Fill #4 | Status: CN

## 2021-04-30 MED FILL — Levothyroxine Sodium Tab 175 MCG: ORAL | 30 days supply | Qty: 30 | Fill #4 | Status: AC

## 2021-05-22 ENCOUNTER — Other Ambulatory Visit: Payer: Self-pay

## 2021-05-22 MED FILL — Atorvastatin Calcium Tab 10 MG (Base Equivalent): ORAL | 30 days supply | Qty: 30 | Fill #4 | Status: AC

## 2021-05-30 ENCOUNTER — Other Ambulatory Visit: Payer: Self-pay

## 2021-05-30 MED FILL — Levothyroxine Sodium Tab 175 MCG: ORAL | 30 days supply | Qty: 30 | Fill #5 | Status: AC

## 2021-05-30 MED FILL — Amlodipine Besylate Tab 5 MG (Base Equivalent): ORAL | 30 days supply | Qty: 30 | Fill #5 | Status: AC

## 2021-06-23 ENCOUNTER — Other Ambulatory Visit: Payer: Self-pay | Admitting: Internal Medicine

## 2021-06-23 ENCOUNTER — Other Ambulatory Visit: Payer: Self-pay

## 2021-06-23 DIAGNOSIS — I10 Essential (primary) hypertension: Secondary | ICD-10-CM

## 2021-06-23 MED FILL — Atorvastatin Calcium Tab 10 MG (Base Equivalent): ORAL | 30 days supply | Qty: 30 | Fill #5 | Status: AC

## 2021-06-24 NOTE — Telephone Encounter (Signed)
Requested medications are due for refill today No requesting early  Requested medications are on the active medication list yes  Last refill 05/22/21  Last visit 01/16/21, asked to return in Aug, did not.  Future visit scheduled NO  Notes to clinic requesting 2 months early, did not return in Aug, no upcoming appt scheduled, please assess.

## 2021-06-25 ENCOUNTER — Other Ambulatory Visit: Payer: Self-pay

## 2021-06-26 ENCOUNTER — Other Ambulatory Visit: Payer: Self-pay

## 2021-06-26 MED ORDER — LOSARTAN POTASSIUM 50 MG PO TABS
ORAL_TABLET | Freq: Every day | ORAL | 0 refills | Status: DC
  Filled 2021-06-26: qty 30, 30d supply, fill #0

## 2021-07-02 ENCOUNTER — Other Ambulatory Visit: Payer: Self-pay | Admitting: Critical Care Medicine

## 2021-07-02 ENCOUNTER — Other Ambulatory Visit: Payer: Self-pay | Admitting: Internal Medicine

## 2021-07-02 ENCOUNTER — Other Ambulatory Visit: Payer: Self-pay

## 2021-07-02 DIAGNOSIS — K219 Gastro-esophageal reflux disease without esophagitis: Secondary | ICD-10-CM

## 2021-07-02 DIAGNOSIS — E039 Hypothyroidism, unspecified: Secondary | ICD-10-CM

## 2021-07-02 MED ORDER — LEVOTHYROXINE SODIUM 175 MCG PO TABS
ORAL_TABLET | ORAL | 1 refills | Status: DC
  Filled 2021-07-02: qty 30, 30d supply, fill #0
  Filled 2021-08-04: qty 30, 30d supply, fill #1

## 2021-07-02 MED ORDER — PANTOPRAZOLE SODIUM 40 MG PO TBEC
DELAYED_RELEASE_TABLET | Freq: Every day | ORAL | 0 refills | Status: DC
  Filled 2021-07-02: qty 30, 30d supply, fill #0
  Filled 2021-08-11: qty 30, 30d supply, fill #1

## 2021-07-02 MED FILL — Amlodipine Besylate Tab 5 MG (Base Equivalent): ORAL | 30 days supply | Qty: 30 | Fill #6 | Status: AC

## 2021-07-03 ENCOUNTER — Other Ambulatory Visit: Payer: Self-pay

## 2021-07-07 ENCOUNTER — Encounter: Payer: Self-pay | Admitting: General Surgery

## 2021-07-21 ENCOUNTER — Other Ambulatory Visit: Payer: Self-pay | Admitting: Internal Medicine

## 2021-07-21 ENCOUNTER — Other Ambulatory Visit: Payer: Self-pay

## 2021-07-21 DIAGNOSIS — I10 Essential (primary) hypertension: Secondary | ICD-10-CM

## 2021-07-21 MED FILL — Atorvastatin Calcium Tab 10 MG (Base Equivalent): ORAL | 30 days supply | Qty: 30 | Fill #6 | Status: AC

## 2021-07-21 NOTE — Telephone Encounter (Signed)
Requested medications are due for refill today yes  Requested medications are on the active medication list yes  Last refill 06/26/21  Last visit 01/16/21  Future visit scheduled no  Notes to clinic .failed protocol of labs within 180 days, labs are from 07/2020, already had a curtesy refill.  Requested Prescriptions  Pending Prescriptions Disp Refills   losartan (COZAAR) 50 MG tablet 30 tablet 0    Sig: TAKE 1 TABLET (50 MG TOTAL) BY MOUTH DAILY.     Cardiovascular:  Angiotensin Receptor Blockers Failed - 07/21/2021  4:48 PM      Failed - Cr in normal range and within 180 days    Creat  Date Value Ref Range Status  07/09/2016 0.79 0.70 - 1.33 mg/dL Final    Comment:      For patients > or = 60 years of age: The upper reference limit for Creatinine is approximately 13% higher for people identified as African-American.      Creatinine, Ser  Date Value Ref Range Status  08/06/2020 0.77 0.76 - 1.27 mg/dL Final          Failed - K in normal range and within 180 days    Potassium  Date Value Ref Range Status  08/06/2020 4.0 3.5 - 5.2 mmol/L Final          Failed - Valid encounter within last 6 months    Recent Outpatient Visits           6 months ago Essential hypertension   [place], [place]   7 months ago Essential hypertension   [place], MD   11 months ago Essential hypertension   [place], [place], NP   1 year ago Essential hypertension   [place], [place], NP   1 year ago Essential hypertension   [place], MD              Passed - Patient is not pregnant      Passed - Last BP in normal range    BP Readings from Last 1 Encounters:  01/16/21 122/72

## 2021-07-22 ENCOUNTER — Other Ambulatory Visit: Payer: Self-pay

## 2021-07-22 ENCOUNTER — Other Ambulatory Visit: Payer: Self-pay | Admitting: Pharmacist

## 2021-07-22 DIAGNOSIS — I10 Essential (primary) hypertension: Secondary | ICD-10-CM

## 2021-07-22 MED ORDER — LOSARTAN POTASSIUM 50 MG PO TABS
ORAL_TABLET | Freq: Every day | ORAL | 0 refills | Status: DC
  Filled 2021-07-22: qty 30, 30d supply, fill #0

## 2021-07-24 ENCOUNTER — Other Ambulatory Visit: Payer: Self-pay

## 2021-08-04 ENCOUNTER — Other Ambulatory Visit: Payer: Self-pay

## 2021-08-04 MED FILL — Amlodipine Besylate Tab 5 MG (Base Equivalent): ORAL | 30 days supply | Qty: 30 | Fill #7 | Status: AC

## 2021-08-05 ENCOUNTER — Other Ambulatory Visit: Payer: Self-pay

## 2021-08-06 ENCOUNTER — Ambulatory Visit: Payer: Self-pay | Admitting: Physician Assistant

## 2021-08-11 ENCOUNTER — Other Ambulatory Visit: Payer: Self-pay

## 2021-08-13 ENCOUNTER — Ambulatory Visit: Payer: Self-pay | Attending: Physician Assistant | Admitting: Physician Assistant

## 2021-08-13 ENCOUNTER — Encounter: Payer: Self-pay | Admitting: Physician Assistant

## 2021-08-13 ENCOUNTER — Other Ambulatory Visit: Payer: Self-pay

## 2021-08-13 VITALS — BP 148/92 | HR 52 | Resp 16 | Wt 184.0 lb

## 2021-08-13 DIAGNOSIS — M5431 Sciatica, right side: Secondary | ICD-10-CM

## 2021-08-13 DIAGNOSIS — E039 Hypothyroidism, unspecified: Secondary | ICD-10-CM

## 2021-08-13 DIAGNOSIS — E782 Mixed hyperlipidemia: Secondary | ICD-10-CM

## 2021-08-13 DIAGNOSIS — K2270 Barrett's esophagus without dysplasia: Secondary | ICD-10-CM

## 2021-08-13 DIAGNOSIS — Z23 Encounter for immunization: Secondary | ICD-10-CM

## 2021-08-13 DIAGNOSIS — D72829 Elevated white blood cell count, unspecified: Secondary | ICD-10-CM

## 2021-08-13 DIAGNOSIS — I10 Essential (primary) hypertension: Secondary | ICD-10-CM

## 2021-08-13 MED ORDER — LEVOTHYROXINE SODIUM 175 MCG PO TABS
ORAL_TABLET | ORAL | 1 refills | Status: DC
  Filled 2021-08-13: qty 90, fill #0
  Filled 2021-09-05: qty 30, 30d supply, fill #0
  Filled 2021-09-29: qty 30, 30d supply, fill #1
  Filled 2021-11-05: qty 30, 30d supply, fill #0
  Filled 2021-12-05: qty 30, 30d supply, fill #1
  Filled 2022-01-06: qty 30, 30d supply, fill #2
  Filled 2022-02-03: qty 30, 30d supply, fill #3

## 2021-08-13 MED ORDER — LOSARTAN POTASSIUM 50 MG PO TABS
ORAL_TABLET | Freq: Every day | ORAL | 1 refills | Status: DC
  Filled 2021-08-13 – 2021-08-28 (×2): qty 30, 30d supply, fill #0
  Filled 2021-09-29: qty 30, 30d supply, fill #1
  Filled 2021-10-27: qty 30, 30d supply, fill #0
  Filled 2021-11-28: qty 30, 30d supply, fill #1

## 2021-08-13 MED ORDER — PANTOPRAZOLE SODIUM 40 MG PO TBEC
DELAYED_RELEASE_TABLET | Freq: Every day | ORAL | 1 refills | Status: DC
  Filled 2021-08-13: qty 90, fill #0
  Filled 2021-09-10 – 2021-10-08 (×2): qty 30, 30d supply, fill #0
  Filled 2021-11-05: qty 30, 30d supply, fill #1
  Filled 2021-12-17: qty 30, 30d supply, fill #2
  Filled 2022-01-06: qty 60, 60d supply, fill #3

## 2021-08-13 MED ORDER — AMLODIPINE BESYLATE 5 MG PO TABS
ORAL_TABLET | Freq: Every day | ORAL | 2 refills | Status: DC
  Filled 2021-08-13: qty 90, fill #0
  Filled 2021-09-05 – 2021-10-08 (×2): qty 30, 30d supply, fill #0
  Filled 2021-11-05: qty 90, 90d supply, fill #1

## 2021-08-13 MED ORDER — DICLOFENAC SODIUM 1 % EX GEL
4.0000 g | Freq: Four times a day (QID) | CUTANEOUS | 5 refills | Status: DC
  Filled 2021-08-13 – 2021-08-28 (×2): qty 100, 7d supply, fill #0
  Filled 2021-09-29: qty 100, 7d supply, fill #1
  Filled 2021-10-27: qty 100, 7d supply, fill #0
  Filled 2021-11-28: qty 100, 7d supply, fill #1
  Filled 2022-01-06: qty 100, 7d supply, fill #2
  Filled 2022-02-03: qty 100, 7d supply, fill #3

## 2021-08-13 MED ORDER — METHOCARBAMOL 500 MG PO TABS
500.0000 mg | ORAL_TABLET | Freq: Three times a day (TID) | ORAL | 0 refills | Status: DC | PRN
  Filled 2021-08-13: qty 40, 13d supply, fill #0

## 2021-08-13 MED ORDER — ATORVASTATIN CALCIUM 10 MG PO TABS
ORAL_TABLET | Freq: Every day | ORAL | 2 refills | Status: DC
  Filled 2021-08-13 – 2021-08-28 (×2): qty 30, 30d supply, fill #0
  Filled 2021-09-29: qty 30, 30d supply, fill #1
  Filled 2021-10-27 (×2): qty 30, 30d supply, fill #0
  Filled 2021-11-28: qty 30, 30d supply, fill #1

## 2021-08-13 MED ORDER — PREDNISONE 10 MG PO TABS
ORAL_TABLET | ORAL | 0 refills | Status: DC
  Filled 2021-08-13: qty 21, 7d supply, fill #0

## 2021-08-13 NOTE — Progress Notes (Signed)
Patient ID: Darren [name], male   DOB: Apr 04, 1961, 61 y.o.   MRN: [id number]   Darren [name], is a 60 y.o. male  GHW:[id number]  VEL:[id number]  DOB - 01/21/1961  Chief Complaint  Patient presents with   Medication Refill       Subjective:   Darren [name] is a 60 y.o. male here today for med RF.    BP OOO has been 120-130/75-85 when on meds.  Out of losartan for several days now. Compliant with other meds.  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  NKI at that time-just seemed strained afterwards.  No urinary s/sx.  No abdominal pain.    No problems updated.  ALLERGIES: No Known Allergies  PAST MEDICAL HISTORY: Past Medical History:  Diagnosis Date   Arthritis    per Medical clearance form.   Ascending aortic aneurysm 06/2016   4.2 cm   Barrett esophagus 2001   path report in EPIC 2001 c/w Barrett's. endoscopy report unavailable.   COPD (chronic obstructive pulmonary disease) ([place])    per medical clearance form.   Depression    per Medical Clearance form(09/18/11)   GERD (gastroesophageal reflux disease)    per Medical clearance form.   Hypertension    Per Medical Clearance form.   Hypothyroidism    per Medical Clearance form.    MEDICATIONS AT HOME: Prior to Admission medications   Medication Sig Start Date End Date Taking? Authorizing Provider  methocarbamol (ROBAXIN) 500 MG tablet Take 1 tablet (500 mg total) by mouth every 8 (eight) hours as needed for muscle spasms. 08/13/21  Yes Freeman Caldron M, PA-C  predniSONE (DELTASONE) 10 MG tablet 6,5,4,3,2,1 take each day's dose all at once in the morning 08/13/21  Yes Mays Paino M, PA-C  amLODipine (NORVASC) 5 MG tablet TAKE 1 TABLET (5 MG TOTAL) BY MOUTH DAILY. 08/13/21 08/13/22  Argentina Donovan, PA-C  atorvastatin (LIPITOR) 10 MG tablet TAKE 1 TABLET (10 MG TOTAL) BY MOUTH DAILY. 08/13/21 08/13/22   Argentina Donovan, PA-C  diclofenac Sodium (VOLTAREN) 1 % GEL APPLY 4 G TOPICALLY 4 (FOUR) TIMES DAILY. 08/13/21 08/13/22  Argentina Donovan, PA-C  levothyroxine (SYNTHROID) 175 MCG tablet TAKE 1 TABLET (175 MCG TOTAL) BY MOUTH DAILY. 08/13/21 08/13/22  Argentina Donovan, PA-C  lidocaine (XYLOCAINE) 2 % solution USE AS DIRECTED 15 MLS IN THE MOUTH OR THROAT EVERY 4 HOURS AS NEEDED FOR UP TO 7 DAYS FOR MOUTH PAIN (GARGLE AND SPIT OUT EVERY 4 HOURS AS NEEDED) 09/12/20 09/12/21  Hazel Sams, PA-C  losartan (COZAAR) 50 MG tablet TAKE 1 TABLET (50 MG TOTAL) BY MOUTH DAILY. 08/13/21 08/13/22  Argentina Donovan, PA-C  ondansetron (ZOFRAN) 8 MG tablet Take 1 tablet (8 mg total) by mouth every 8 (eight) hours as needed for nausea or vomiting. 01/18/20   Raylene Everts, MD  pantoprazole (PROTONIX) 40 MG tablet TAKE 1 TABLET (40 MG TOTAL) BY MOUTH DAILY. 08/13/21 08/13/22  Argentina Donovan, PA-C    ROS: Neg HEENT Neg resp Neg cardiac Neg GI Neg GU Neg psych Neg neuro  Objective:   Vitals:   08/13/21 0920  BP: (!) 148/92  Pulse: (!) 52  Resp: 16  SpO2: 95%  Weight: 184 lb (83.5 kg)   Exam General appearance : Awake, alert, not in any distress. Speech Clear. Not toxic looking HEENT: Atraumatic and Normocephalic Neck:  Supple, no JVD. No cervical lymphadenopathy.  Chest: Good air entry bilaterally, CTAB.  No rales/rhonchi/wheezing CVS: S1 S2 regular, no murmurs.  Back-no spiny TTP.  There is muscle spasm in the RLB.  Neg SLR B Extremities: B/L Lower Ext shows no edema, both legs are warm to touch.  DTR=BLE Neurology: Awake alert, and oriented X 3, CN II-XII intact, Non focal Skin: No Rash  Data Review Lab Results  Component Value Date   HGBA1C 5 4 11/21/2015    Assessment & Plan   1. Essential hypertension BP elevated today but has been out of losartan.  BP controlled based on OOO readings - Comprehensive metabolic panel - amLODipine (NORVASC) 5 MG tablet; TAKE 1 TABLET (5  MG TOTAL) BY MOUTH DAILY.  Dispense: 90 tablet; Refill: 2 - losartan (COZAAR) 50 MG tablet; TAKE 1 TABLET (50 MG TOTAL) BY MOUTH DAILY.  Dispense: 90 tablet; Refill: 1  2. Acquired hypothyroidism - Thyroid Panel With TSH - levothyroxine (SYNTHROID) 175 MCG tablet; TAKE 1 TABLET (175 MCG TOTAL) BY MOUTH DAILY.  Dispense: 90 tablet; Refill: 1  3. Leukocytosis, unspecified type - CBC with Differential/Platelet  4. Mixed hyperlipidemia - Lipid panel - atorvastatin (LIPITOR) 10 MG tablet; TAKE 1 TABLET (10 MG TOTAL) BY MOUTH DAILY.  Dispense: 90 tablet; Refill: 2  5. Barrett's esophagus without dysplasia - pantoprazole (PROTONIX) 40 MG tablet; TAKE 1 TABLET (40 MG TOTAL) BY MOUTH DAILY.  Dispense: 90 tablet; Refill: 1  6. Sciatica of right side No red flags today - diclofenac Sodium (VOLTAREN) 1 % GEL; APPLY 4 G TOPICALLY 4 (FOUR) TIMES DAILY.  Dispense: 100 g; Refill: 5 - predniSONE (DELTASONE) 10 MG tablet; 6,5,4,3,2,1 take each day's dose all at once in the morning  Dispense: 21 tablet; Refill: 0 - methocarbamol (ROBAXIN) 500 MG tablet; Take 1 tablet (500 mg total) by mouth every 8 (eight) hours as needed for muscle spasms.  Dispense: 40 tablet; Refill: 0    Patient have been counseled extensively about nutrition and exercise. Other issues discussed during this visit include: low cholesterol diet, weight control and daily exercise, foot care, annual eye examinations at Ophthalmology, importance of adherence with medications and regular follow-up. We also discussed long term complications of uncontrolled diabetes and hypertension.   Return in about 6 months (around 02/10/2022) for PCP for chronic conditions.  The patient was given clear instructions to go to ER or return to medical center if symptoms don't improve, worsen or new problems develop. The patient verbalized understanding. The patient was told to call to get lab results if they haven't heard anything in the next week.       Freeman Caldron, PA-C [facility] [place], [place]   08/13/2021, 9:26 AM

## 2021-08-14 ENCOUNTER — Other Ambulatory Visit: Payer: Self-pay | Admitting: Physician Assistant

## 2021-08-14 DIAGNOSIS — D72829 Elevated white blood cell count, unspecified: Secondary | ICD-10-CM

## 2021-08-14 LAB — COMPREHENSIVE METABOLIC PANEL
ALT: 38 IU/L (ref 0–44)
AST: 25 IU/L (ref 0–40)
Albumin/Globulin Ratio: 2.1 (ref 1.2–2.2)
Albumin: 4.7 g/dL (ref 3.8–4.9)
Alkaline Phosphatase: 99 IU/L (ref 44–121)
BUN/Creatinine Ratio: 18 (ref 10–24)
BUN: 15 mg/dL (ref 8–27)
Bilirubin Total: 0.4 mg/dL (ref 0.0–1.2)
CO2: 23 mmol/L (ref 20–29)
Calcium: 9.3 mg/dL (ref 8.6–10.2)
Chloride: 100 mmol/L (ref 96–106)
Creatinine, Ser: 0.83 mg/dL (ref 0.76–1.27)
Globulin, Total: 2.2 g/dL (ref 1.5–4.5)
Glucose: 151 mg/dL — ABNORMAL HIGH (ref 70–99)
Potassium: 4.5 mmol/L (ref 3.5–5.2)
Sodium: 137 mmol/L (ref 134–144)
Total Protein: 6.9 g/dL (ref 6.0–8.5)
eGFR: 100 mL/min/{1.73_m2} (ref >59)

## 2021-08-14 LAB — CBC WITH DIFFERENTIAL/PLATELET
Basophils Absolute: 0.1 10*3/uL (ref 0.0–0.2)
Basos: 0 %
EOS (ABSOLUTE): 0.2 10*3/uL (ref 0.0–0.4)
Eos: 1 %
Hematocrit: 44.5 % (ref 37.5–51.0)
Hemoglobin: 15.1 g/dL (ref 13.0–17.7)
Immature Grans (Abs): 0 10*3/uL (ref 0.0–0.1)
Immature Granulocytes: 0 %
Lymphocytes Absolute: 12.4 10*3/uL — ABNORMAL HIGH (ref 0.7–3.1)
Lymphs: 73 %
MCH: 27.6 pg (ref 26.6–33.0)
MCHC: 33.9 g/dL (ref 31.5–35.7)
MCV: 81 fL (ref 79–97)
Monocytes Absolute: 0.3 10*3/uL (ref 0.1–0.9)
Monocytes: 2 %
Neutrophils Absolute: 4.1 10*3/uL (ref 1.4–7.0)
Neutrophils: 24 %
Platelets: 192 10*3/uL (ref 150–450)
RBC: 5.48 x10E6/uL (ref 4.14–5.80)
RDW: 12.9 % (ref 11.6–15.4)
WBC: 17 10*3/uL — ABNORMAL HIGH (ref 3.4–10.8)

## 2021-08-14 LAB — LIPID PANEL
Chol/HDL Ratio: 3.6 ratio (ref 0.0–5.0)
Cholesterol, Total: 157 mg/dL (ref 100–199)
HDL: 44 mg/dL (ref >39)
LDL Chol Calc (NIH): 88 mg/dL (ref 0–99)
Triglycerides: 139 mg/dL (ref 0–149)
VLDL Cholesterol Cal: 25 mg/dL (ref 5–40)

## 2021-08-14 LAB — THYROID PANEL WITH TSH
Free Thyroxine Index: 2.6 (ref 1.2–4.9)
T3 Uptake Ratio: 32 % (ref 24–39)
T4, Total: 8 ug/dL (ref 4.5–12.0)
TSH: 1.59 u[IU]/mL (ref 0.450–4.500)

## 2021-08-25 ENCOUNTER — Telehealth: Payer: Self-pay | Admitting: Hematology and Oncology

## 2021-08-25 NOTE — Telephone Encounter (Signed)
Scheduled appt per 11/17 referral. Pt is aware of appt date and time.

## 2021-08-28 ENCOUNTER — Other Ambulatory Visit: Payer: Self-pay

## 2021-08-28 ENCOUNTER — Other Ambulatory Visit: Payer: Self-pay | Admitting: Internal Medicine

## 2021-08-28 DIAGNOSIS — E782 Mixed hyperlipidemia: Secondary | ICD-10-CM

## 2021-08-28 NOTE — Telephone Encounter (Signed)
Medication Refill - Medication:atorvastatin (LIPITOR) 10 MG tablet , back pain med/(he didn't know the name) and  losartin   Has the patient contacted their pharmacy? yes (Agent: If no, request that the patient contact the pharmacy for the refill. If patient does not wish to contact the pharmacy document the reason why and proceed with request.) (Agent: If yes, when and what did the pharmacy advise?)He couldn't get thru  Preferred Pharmacy (with phone number or street name):  [facility] and [place]  Phone:  [phone] Fax:  [phone]  Has the patient been seen for an appointment in the last year OR does the patient have an upcoming appointment? yes  Agent: Please be advised that RX refills may take up to 3 business days. We ask that you follow-up with your pharmacy.

## 2021-08-30 NOTE — Telephone Encounter (Signed)
Pharmacy closed on weekends. Last RF 08/13/21 #90 2 RF. Pt has FVS. No refill due at this time.  Requested Prescriptions  Pending Prescriptions Disp Refills   atorvastatin (LIPITOR) 10 MG tablet 90 tablet 2    Sig: TAKE 1 TABLET (10 MG TOTAL) BY MOUTH DAILY.     Cardiovascular:  Antilipid - Statins Passed - 08/28/2021  9:21 PM      Passed - Total Cholesterol in normal range and within 360 days    Cholesterol, Total  Date Value Ref Range Status  08/13/2021 157 100 - 199 mg/dL Final          Passed - LDL in normal range and within 360 days    LDL Chol Calc (NIH)  Date Value Ref Range Status  08/13/2021 88 0 - 99 mg/dL Final          Passed - HDL in normal range and within 360 days    HDL  Date Value Ref Range Status  08/13/2021 44 >39 mg/dL Final          Passed - Triglycerides in normal range and within 360 days    Triglycerides  Date Value Ref Range Status  08/13/2021 139 0 - 149 mg/dL Final          Passed - Patient is not pregnant      Passed - Valid encounter within last 12 months    Recent Outpatient Visits           2 weeks ago Essential hypertension   [place], [place]   7 months ago Essential hypertension   [place], Deborah B, MD   8 months ago Essential hypertension   [place], Patrick E, MD   1 year ago Essential hypertension   [place], [place], NP   1 year ago Essential hypertension   [place], Amy J, NP       Future Appointments             In 5 months Wynetta Emery Dalbert Batman, MD [place]

## 2021-09-04 ENCOUNTER — Other Ambulatory Visit: Payer: Self-pay | Admitting: Internal Medicine

## 2021-09-04 DIAGNOSIS — I10 Essential (primary) hypertension: Secondary | ICD-10-CM

## 2021-09-04 DIAGNOSIS — E039 Hypothyroidism, unspecified: Secondary | ICD-10-CM

## 2021-09-04 DIAGNOSIS — K2270 Barrett's esophagus without dysplasia: Secondary | ICD-10-CM

## 2021-09-04 NOTE — Telephone Encounter (Signed)
Medication Refill - Medication:amlodipine 5mg  /(PROTONIX) 40 MG tablet  and levothyroxine (SYNTHROID) 175 MCG tablet   Has the patient contacted their pharmacy? yes (Agent: If no, request that the patient contact the pharmacy for the refill. If patient does not wish to contact the pharmacy document the reason why and proceed with request.) (Agent: If yes, when and what did the pharmacy advise?)  Preferred Pharmacy (with phone number or street name) [facility] and [place] Phone:  [phone]  Fax:  [phone]     Has the patient been seen for an appointment in the last year OR does the patient have an upcoming appointment? yes  Agent: Please be advised that RX refills may take up to 3 business days. We ask that you follow-up with your pharmacy.

## 2021-09-05 ENCOUNTER — Encounter: Payer: Self-pay | Admitting: Hematology and Oncology

## 2021-09-05 ENCOUNTER — Other Ambulatory Visit: Payer: Self-pay

## 2021-09-05 ENCOUNTER — Other Ambulatory Visit: Payer: Self-pay | Admitting: Hematology and Oncology

## 2021-09-05 ENCOUNTER — Inpatient Hospital Stay: Payer: Self-pay | Attending: Hematology and Oncology | Admitting: Hematology and Oncology

## 2021-09-05 DIAGNOSIS — Z79899 Other long term (current) drug therapy: Secondary | ICD-10-CM | POA: Insufficient documentation

## 2021-09-05 DIAGNOSIS — E669 Obesity, unspecified: Secondary | ICD-10-CM

## 2021-09-05 DIAGNOSIS — M2550 Pain in unspecified joint: Secondary | ICD-10-CM

## 2021-09-05 DIAGNOSIS — Z87891 Personal history of nicotine dependence: Secondary | ICD-10-CM | POA: Insufficient documentation

## 2021-09-05 DIAGNOSIS — M898X9 Other specified disorders of bone, unspecified site: Secondary | ICD-10-CM

## 2021-09-05 DIAGNOSIS — E039 Hypothyroidism, unspecified: Secondary | ICD-10-CM

## 2021-09-05 DIAGNOSIS — C9110 Chronic lymphocytic leukemia of B-cell type not having achieved remission: Secondary | ICD-10-CM

## 2021-09-05 NOTE — Progress Notes (Signed)
[place] NOTE  Patient Care Team: Ladell Pier, MD as PCP - General (Internal Medicine) Gala Romney, Cristopher Estimable, MD as Consulting Physician (Gastroenterology)  CHIEF COMPLAINTS/PURPOSE OF CONSULTATION:  Monoclonal B-cell lymphocytosis  HISTORY OF PRESENTING ILLNESS:  Darren [name] 60 y.o. male is here because of elevated WBC.  He was found to have abnormal CBC from his blood work for the last few years I reviewed his blood work over the last few years with the patient He had seen Dr. Irene Limbo in the past with extensive work-up Flow cytometry confirmed monoclonal B-cell lymphocytosis Since then, his white count has trended up a little bit He has severe diffuse bone pain throughout and recently found that he has protuberant umbilicus The patient is also known to have aortic aneurysm, abnormal nonspecific lung findings and Barrett's esophagus  He denies recent infection. The last prescription antibiotics was more than 3 months ago There is not reported symptoms of sinus congestion, cough, urinary frequency/urgency or dysuria, diarrhea, or abnormal skin rash.  The patient has past smoking history He was recently placed on prednisone for diffuse joint pain  MEDICAL HISTORY:  Past Medical History:  Diagnosis Date   Arthritis    per Medical clearance form.   Ascending aortic aneurysm 06/2016   4.2 cm   Barrett esophagus 2001   path report in EPIC 2001 c/w Barrett's. endoscopy report unavailable.   COPD (chronic obstructive pulmonary disease) ([place])    per medical clearance form.   Depression    per Medical Clearance form(09/18/11)   GERD (gastroesophageal reflux disease)    per Medical clearance form.   Hypertension    Per Medical Clearance form.   Hypothyroidism    per Medical Clearance form.    SURGICAL HISTORY: Past Surgical History:  Procedure Laterality Date   BIOPSY  09/18/2016   Procedure: BIOPSY;  Surgeon: Daneil Dolin, MD;  [facility];  Service: Endoscopy;;  biopsy of distal esophagus   ESOPHAGOGASTRODUODENOSCOPY  2001   barrett's per path   ESOPHAGOGASTRODUODENOSCOPY (EGD) WITH PROPOFOL N/A 09/18/2016   Procedure: ESOPHAGOGASTRODUODENOSCOPY (EGD) WITH PROPOFOL;  Surgeon: Daneil Dolin, MD;  [facility];  Service: Endoscopy;  Laterality: N/A;  [place] WITH ALVEOLOPLASTY  11/30/2011   Procedure: MULTIPLE EXTRACION WITH ALVEOLOPLASTY;  Surgeon: Gae Bon, DDS;  Location: [place];  Service: Oral Surgery;  Laterality: Bilateral;    SOCIAL HISTORY: Social History   Socioeconomic History   Marital status: Divorced    Spouse name: Not on file   Number of children: 2   Years of education: Not on file   Highest education level: Not on file  Occupational History   Occupation: Associate Professor  Tobacco Use   Smoking status: Former    Packs/day: 1.00    Years: 26.00    Pack years: 26.00    Types: Cigarettes    Quit date: 09/15/2004    Years since quitting: 16.9   Smokeless tobacco: Never   Tobacco comments:    Quit in 2005  Substance and Sexual Activity   Alcohol use: Yes    Comment: ON WEEKENDS   Drug use: No   Sexual activity: Not Currently    Birth control/protection: None  Other Topics Concern   Not on file  Social History Narrative   Lives alone.     Social Determinants of Health   Financial Resource Strain: Not  on file  Food Insecurity: Not on file  Transportation Needs: Not on file  Physical Activity: Not on file  Stress: Not on file  Social Connections: Not on file  Intimate Partner Violence: Not on file    FAMILY HISTORY: Family History  Problem Relation Age of Onset   Cancer Mother        lung cancer   Lung cancer Mother    Cancer Father        lung   Stroke Father    Heart attack Father 78   Thyroid disease Sister    Cancer Maternal Aunt        stomach   Colon cancer Neg Hx    Esophageal cancer Neg Hx      ALLERGIES:  has No Known Allergies.  MEDICATIONS:  Current Outpatient Medications  Medication Sig Dispense Refill   amLODipine (NORVASC) 5 MG tablet TAKE 1 TABLET (5 MG TOTAL) BY MOUTH DAILY. 90 tablet 2   atorvastatin (LIPITOR) 10 MG tablet TAKE 1 TABLET (10 MG TOTAL) BY MOUTH DAILY. 90 tablet 2   diclofenac Sodium (VOLTAREN) 1 % GEL APPLY 4 G TOPICALLY 4 (FOUR) TIMES DAILY. 100 g 5   levothyroxine (SYNTHROID) 175 MCG tablet TAKE 1 TABLET (175 MCG TOTAL) BY MOUTH DAILY. 90 tablet 1   lidocaine (XYLOCAINE) 2 % solution USE AS DIRECTED 15 MLS IN THE MOUTH OR THROAT EVERY 4 HOURS AS NEEDED FOR UP TO 7 DAYS FOR MOUTH PAIN (GARGLE AND SPIT OUT EVERY 4 HOURS AS NEEDED) 650 mL 0   losartan (COZAAR) 50 MG tablet TAKE 1 TABLET (50 MG TOTAL) BY MOUTH DAILY. 90 tablet 1   methocarbamol (ROBAXIN) 500 MG tablet Take 1 tablet (500 mg total) by mouth every 8 (eight) hours as needed for muscle spasms. 40 tablet 0   ondansetron (ZOFRAN) 8 MG tablet Take 1 tablet (8 mg total) by mouth every 8 (eight) hours as needed for nausea or vomiting. 20 tablet 0   pantoprazole (PROTONIX) 40 MG tablet TAKE 1 TABLET (40 MG TOTAL) BY MOUTH DAILY. 90 tablet 1   No current facility-administered medications for this visit.    REVIEW OF SYSTEMS:   Constitutional: Denies fevers, chills or abnormal night sweats Eyes: Denies blurriness of vision, double vision or watery eyes Ears, nose, mouth, throat, and face: Denies mucositis or sore throat Respiratory: Denies cough, dyspnea or wheezes Cardiovascular: Denies palpitation, chest discomfort or lower extremity swelling Gastrointestinal:  Denies nausea, heartburn or change in bowel habits Skin: Denies abnormal skin rashes Lymphatics: Denies new lymphadenopathy or easy bruising Neurological:Denies numbness, tingling or new weaknesses Behavioral/Psych: Mood is stable, no new changes  All other systems were reviewed with the patient and are negative.  PHYSICAL  EXAMINATION: ECOG PERFORMANCE STATUS: 1 - Symptomatic but completely ambulatory  Vitals:   09/05/21 1355  BP: 124/86  Pulse: 63  Resp: 18  Temp: 98 F (36.7 C)  SpO2: 97%   Filed Weights   09/05/21 1355  Weight: 183 lb 3.2 oz (83.1 kg)    GENERAL:alert, no distress and comfortable.  Central obesity is noted.  I am not able to appreciate organomegaly SKIN: skin color, texture, turgor are normal, no rashes or significant lesions EYES: normal, conjunctiva are pink and non-injected, sclera clear OROPHARYNX:no exudate, no erythema and lips, buccal mucosa, and tongue normal  NECK: supple, thyroid normal size, non-tender, without nodularity LYMPH:  no palpable lymphadenopathy in the cervical, axillary or inguinal LUNGS: clear to auscultation and percussion with normal  breathing effort HEART: regular rate & rhythm and no murmurs and no lower extremity edema ABDOMEN:abdomen soft, non-tender and normal bowel sounds Musculoskeletal:no cyanosis of digits and no clubbing  PSYCH: alert & oriented x 3 with fluent speech NEURO: no focal motor/sensory deficits  LABORATORY DATA:  I have reviewed the data as listed Recent Results (from the past 2160 hour(s))  Thyroid Panel With TSH     Status: None   Collection Time: 08/13/21  9:33 AM  Result Value Ref Range   TSH 1.590 0.450 - 4.500 uIU/mL   T4, Total 8.0 4.5 - 12.0 ug/dL   T3 Uptake Ratio 32 24 - 39 %   Free Thyroxine Index 2.6 1.2 - 4.9  Comprehensive metabolic panel     Status: Abnormal   Collection Time: 08/13/21  9:33 AM  Result Value Ref Range   Glucose 151 (H) 70 - 99 mg/dL   BUN 15 8 - 27 mg/dL   Creatinine, Ser 0.83 0.76 - 1.27 mg/dL   eGFR 100 >59 mL/min/1.73   BUN/Creatinine Ratio 18 10 - 24   Sodium 137 134 - 144 mmol/L   Potassium 4.5 3.5 - 5.2 mmol/L   Chloride 100 96 - 106 mmol/L   CO2 23 20 - 29 mmol/L   Calcium 9.3 8.6 - 10.2 mg/dL   Total Protein 6.9 6.0 - 8.5 g/dL   Albumin 4.7 3.8 - 4.9 g/dL   Globulin, Total  2.2 1.5 - 4.5 g/dL   Albumin/Globulin Ratio 2.1 1.2 - 2.2   Bilirubin Total 0.4 0.0 - 1.2 mg/dL   Alkaline Phosphatase 99 44 - 121 IU/L   AST 25 0 - 40 IU/L   ALT 38 0 - 44 IU/L  CBC with Differential/Platelet     Status: Abnormal   Collection Time: 08/13/21  9:33 AM  Result Value Ref Range   WBC 17.0 (H) 3.4 - 10.8 x10E3/uL   RBC 5.48 4.14 - 5.80 x10E6/uL   Hemoglobin 15.1 13.0 - 17.7 g/dL   Hematocrit 44.5 37.5 - 51.0 %   MCV 81 79 - 97 fL   MCH 27.6 26.6 - 33.0 pg   MCHC 33.9 31.5 - 35.7 g/dL   RDW 12.9 11.6 - 15.4 %   Platelets 192 150 - 450 x10E3/uL   Neutrophils 24 Not Estab. %   Lymphs 73 Not Estab. %    Comment: Smudge cells present Atypical lymphocytes.    Monocytes 2 Not Estab. %   Eos 1 Not Estab. %   Basos 0 Not Estab. %   Neutrophils Absolute 4.1 1.4 - 7.0 x10E3/uL   Lymphocytes Absolute 12.4 (H) 0.7 - 3.1 x10E3/uL   Monocytes Absolute 0.3 0.1 - 0.9 x10E3/uL   EOS (ABSOLUTE) 0.2 0.0 - 0.4 x10E3/uL   Basophils Absolute 0.1 0.0 - 0.2 x10E3/uL   Immature Granulocytes 0 Not Estab. %   Immature Grans (Abs) 0.0 0.0 - 0.1 x10E3/uL   Hematology Comments: Note:     Comment: Verified by microscopic examination.  Lipid panel     Status: None   Collection Time: 08/13/21  9:33 AM  Result Value Ref Range   Cholesterol, Total 157 100 - 199 mg/dL   Triglycerides 139 0 - 149 mg/dL   HDL 44 >39 mg/dL   VLDL Cholesterol Cal 25 5 - 40 mg/dL   LDL Chol Calc (NIH) 88 0 - 99 mg/dL   Chol/HDL Ratio 3.6 0.0 - 5.0 ratio    Comment:  T. Chol/HDL Ratio                                             Men  Women                               1/2 Avg.Risk  3.4    3.3                                   Avg.Risk  5.0    4.4                                2X Avg.Risk  9.6    7.1                                3X Avg.Risk 23.4   11.0     RADIOGRAPHIC STUDIES: I have reviewed prior CT imaging report I have personally reviewed the radiological images as  listed and agreed with the findings in the report.   ASSESSMENT & PLAN  Chronic lymphocytic leukemia ([place]) He has slight worsening lymphocytosis since his last visit The patient complain of new onset of protuberance umbilicus and diffuse joint pain/bone pain Due to his obesity, I am not able to appreciate any organomegaly or lymphadenopathy We discussed the risk and benefits of imaging study Due to lack of insurance, we will try to get him financial assistant As soon as CT imaging can be performed, I will see him back for further evaluation  Orders Placed This Encounter  Procedures   CT CHEST ABDOMEN PELVIS W CONTRAST    Standing Status:   Future    Standing Expiration Date:   09/05/2022    Order Specific Question:   Preferred imaging location?    Answer:   [facility]    Order Specific Question:   Radiology Contrast Protocol - do NOT remove file path    Answer:   [email or web address]    All questions were answered. The patient knows to call the clinic with any problems, questions or concerns. I spent 55 minutes counseling the patient face to face. The total time spent in the appointment was 55 minutes and more than 50% was on counseling.     Heath Lark, MD 09/05/2021 4:01 PM

## 2021-09-05 NOTE — Progress Notes (Signed)
Met with uninsured patient at registration whom had concerns regarding insurance.  Introduced myself as Arboriculturist and offered available resources.  Advised him of 57% automatic uninsured discount for services billed through [facility]. Asked if he receives additional assistance Clinical biochemist) being a patient of [facility]. Patient states he received in past but the amount of discount was lowered due to change in income.  Gave him my card to contact me at his earliest convenience to discuss further resources such as [employer] grant due to his name being called for appointment. He states he will call next week sometime.

## 2021-09-05 NOTE — Telephone Encounter (Signed)
[facility] called and spoke to [place], [facility] about the refill(s) Norvasc 5mg  and Synthroid 11mcg requested. Advised it was sent on 08/13/21 #90/2 refill(s). She states that meds are on file and can be filled. Asked if she can go ahead and fill them and pt will p/up meds. Will refuse this request.   Requested Prescriptions  Pending Prescriptions Disp Refills   amLODipine (NORVASC) 5 MG tablet 90 tablet 2    Sig: TAKE 1 TABLET (5 MG TOTAL) BY MOUTH DAILY.     Cardiovascular:  Calcium Channel Blockers Failed - 09/04/2021  5:48 PM      Failed - Last BP in normal range    BP Readings from Last 1 Encounters:  08/13/21 (!) 148/92          Passed - Valid encounter within last 6 months    Recent Outpatient Visits           3 weeks ago Essential hypertension   [place], [place], [place]   7 months ago Essential hypertension   [place], Deborah B, MD   9 months ago Essential hypertension   [place], Patrick E, MD   1 year ago Essential hypertension   [place], [place], NP   1 year ago Essential hypertension   [place], Amy J, NP       Future Appointments             In 5 months Ladell Pier, MD [place]             levothyroxine (SYNTHROID) 175 MCG tablet 90 tablet 1    Sig: TAKE 1 TABLET (175 MCG TOTAL) BY MOUTH DAILY.     Endocrinology:  Hypothyroid Agents Failed - 09/04/2021  5:48 PM      Failed - TSH needs to be rechecked within 3 months after an abnormal result. Refill until TSH is due.      Passed - TSH in normal range and within 360 days    TSH  Date Value Ref Range Status  08/13/2021 1.590 0.450 - 4.500 uIU/mL Final          Passed - Valid encounter within last 12 months    Recent Outpatient Visits           3 weeks ago  Essential hypertension   [place], [place]   7 months ago Essential hypertension   [place], Deborah B, MD   9 months ago Essential hypertension   [place], Patrick E, MD   1 year ago Essential hypertension   [place], [place], NP   1 year ago Essential hypertension   [place], Amy J, NP       Future Appointments             In 5 months Wynetta Emery Dalbert Batman, MD [place]

## 2021-09-05 NOTE — Assessment & Plan Note (Signed)
He has slight worsening lymphocytosis since his last visit The patient complain of new onset of protuberance umbilicus and diffuse joint pain/bone pain Due to his obesity, I am not able to appreciate any organomegaly or lymphadenopathy We discussed the risk and benefits of imaging study Due to lack of insurance, we will try to get him financial assistant As soon as CT imaging can be performed, I will see him back for further evaluation

## 2021-09-05 NOTE — Telephone Encounter (Signed)
Requested Prescriptions  Refused Prescriptions Disp Refills  . amLODipine (NORVASC) 5 MG tablet 90 tablet 2    Sig: TAKE 1 TABLET (5 MG TOTAL) BY MOUTH DAILY.     Cardiovascular:  Calcium Channel Blockers Failed - 09/04/2021  5:48 PM      Failed - Last BP in normal range    BP Readings from Last 1 Encounters:  08/13/21 (!) 148/92         Passed - Valid encounter within last 6 months    Recent Outpatient Visits          3 weeks ago Essential hypertension   [place], [place]   7 months ago Essential hypertension   [place], Deborah B, MD   9 months ago Essential hypertension   [place], Patrick E, MD   1 year ago Essential hypertension   [place], [place], NP   1 year ago Essential hypertension   [place], Amy J, NP      Future Appointments            In 5 months Wynetta Emery Dalbert Batman, MD [place]           . levothyroxine (SYNTHROID) 175 MCG tablet 90 tablet 1    Sig: TAKE 1 TABLET (175 MCG TOTAL) BY MOUTH DAILY.     Endocrinology:  Hypothyroid Agents Failed - 09/04/2021  5:48 PM      Failed - TSH needs to be rechecked within 3 months after an abnormal result. Refill until TSH is due.      Passed - TSH in normal range and within 360 days    TSH  Date Value Ref Range Status  08/13/2021 1.590 0.450 - 4.500 uIU/mL Final         Passed - Valid encounter within last 12 months    Recent Outpatient Visits          3 weeks ago Essential hypertension   [place], [place]   7 months ago Essential hypertension   [place], Deborah B, MD   9 months ago Essential hypertension   [place], Patrick E, MD   1 year ago  Essential hypertension   [place], [place], NP   1 year ago Essential hypertension   [place], Amy J, NP      Future Appointments            In 5 months Wynetta Emery Dalbert Batman, MD [place]

## 2021-09-08 ENCOUNTER — Telehealth: Payer: Self-pay

## 2021-09-08 NOTE — Telephone Encounter (Signed)
Attempted to call patient with the following information. LVM requesting patient to callback. Callback number provided.

## 2021-09-08 NOTE — Telephone Encounter (Signed)
-----   Message from Minette Brine sent at 09/08/2021 10:47 AM EST ----- Regarding: RE: CT imaging PA My pleasure.  Please have him call me with any additional questions or concerns.    ----- Message ----- From: Heath Lark, MD Sent: 09/08/2021  10:23 AM EST To: Flo Shanks, RN, Minette Brine, # Subject: RE: CT imaging PA                              Thank you, Avelina Laine, can you call him about this? If he does not want to pursue CT now, I will just schedule a follow-up in 3 months Please let me know ----- Message ----- From: Minette Brine Sent: 09/08/2021  10:21 AM EST To: Flo Shanks, RN, Gard[place] Coins, # Subject: RE: CT imaging PA                              Hi all.  Patient can request to be billed for the CT. He will automatically receive a 57% discount for being uninsured. When they call him from the Pre-Service center regarding cost, he will need to let them know at that time what he is or not able to do. When I spoke with him regarding the financial assistance, he seemed as if it was too much to go through. He was advised before to schedule an appointment with financial at [facility] where his PCP is and he failed to do that as well. For some reason he does not want to submit documentation that is requested, therefore his only other option is request to be billed and set up a payment plan when they call him before the CT.   We do not have programs that assist with CT's. As far as [employer] which does not help with CT cost, he states he would call me to discuss further.   Best, Marguarite Arbour ----- Message ----- From: Jolaine Click, RN Sent: 09/08/2021   9:52 AM EST To: Flo Shanks, RN, Minette Brine, # Subject: RE: CT imaging PA                               ----- Message ----- From: Heath Lark, MD Sent: 09/08/2021   9:44 AM EST To: Flo Shanks, RN, Gard[place] Coins, # Subject: CT imaging PA                                   Hi Baxter Flattery,  I was told he has no insurance How do we approach this? He is scheduled for CT this week

## 2021-09-10 ENCOUNTER — Other Ambulatory Visit: Payer: Self-pay

## 2021-09-10 ENCOUNTER — Telehealth: Payer: Self-pay

## 2021-09-10 NOTE — Telephone Encounter (Signed)
Called back and scheduled appt with Dr. Alvy Bimler 12/29 at 2 pm. He verbalized understanding. Instructed to call [facility] for a financial appt to see if he qualifies for assistance. Given phone #, he said he does not have access to a computer to bring the paper work they need.  Given phone # for Marguarite Arbour, financial resource person to call at [facility].

## 2021-09-10 NOTE — Telephone Encounter (Signed)
Called back to verify if he wants to keep CT scan appt and lab appt as scheduled on 12/20. He wants to keep appts as scheduled and does not want to delay anything. He states that when the pre-service center calls he will set up repayment on a payment plan.

## 2021-09-10 NOTE — Telephone Encounter (Signed)
Called and left a message asking him to call the office back. Following up on previous call on Monday.

## 2021-09-12 ENCOUNTER — Other Ambulatory Visit: Payer: Self-pay

## 2021-09-15 ENCOUNTER — Other Ambulatory Visit: Payer: Self-pay | Admitting: Hematology and Oncology

## 2021-09-15 ENCOUNTER — Telehealth: Payer: Self-pay

## 2021-09-15 DIAGNOSIS — C9110 Chronic lymphocytic leukemia of B-cell type not having achieved remission: Secondary | ICD-10-CM

## 2021-09-15 NOTE — Telephone Encounter (Signed)
Returned his call. He missed a call from pre- services center due to being at work and not being able to talk. Attempted to give the phone # for pre-service center but he does not have a pen. He ask that I call back tomorrow.  Will call tomorrow.

## 2021-09-16 ENCOUNTER — Inpatient Hospital Stay: Payer: Self-pay

## 2021-09-16 ENCOUNTER — Other Ambulatory Visit: Payer: Self-pay

## 2021-09-16 ENCOUNTER — Encounter: Payer: Self-pay | Admitting: Hematology and Oncology

## 2021-09-16 ENCOUNTER — Encounter (HOSPITAL_COMMUNITY): Payer: Self-pay

## 2021-09-16 ENCOUNTER — Ambulatory Visit (HOSPITAL_COMMUNITY)
Admission: RE | Admit: 2021-09-16 | Discharge: 2021-09-16 | Disposition: A | Discharge status: Home / Self Care | Payer: Self-pay | Source: Ambulatory Visit | Attending: Hematology and Oncology | Admitting: Hematology and Oncology

## 2021-09-16 DIAGNOSIS — C9110 Chronic lymphocytic leukemia of B-cell type not having achieved remission: Secondary | ICD-10-CM

## 2021-09-16 LAB — CBC WITH DIFFERENTIAL (CANCER CENTER ONLY)
Abs Immature Granulocytes: 0.03 10*3/uL (ref 0.00–0.07)
Basophils Absolute: 0.1 10*3/uL (ref 0.0–0.1)
Basophils Relative: 0 %
Eosinophils Absolute: 0.3 10*3/uL (ref 0.0–0.5)
Eosinophils Relative: 2 %
HCT: 42.8 % (ref 39.0–52.0)
Hemoglobin: 14.4 g/dL (ref 13.0–17.0)
Immature Granulocytes: 0 %
Lymphocytes Relative: 65 %
Lymphs Abs: 11.5 10*3/uL — ABNORMAL HIGH (ref 0.7–4.0)
MCH: 27.5 pg (ref 26.0–34.0)
MCHC: 33.6 g/dL (ref 30.0–36.0)
MCV: 81.7 fL (ref 80.0–100.0)
Monocytes Absolute: 0.6 10*3/uL (ref 0.1–1.0)
Monocytes Relative: 4 %
Neutro Abs: 5 10*3/uL (ref 1.7–7.7)
Neutrophils Relative %: 29 %
Platelet Count: 196 10*3/uL (ref 150–400)
RBC: 5.24 MIL/uL (ref 4.22–5.81)
RDW: 12.8 % (ref 11.5–15.5)
Smear Review: NORMAL
WBC Count: 17.6 10*3/uL — ABNORMAL HIGH (ref 4.0–10.5)
nRBC: 0 % (ref 0.0–0.2)

## 2021-09-16 LAB — CMP (CANCER CENTER ONLY)
ALT: 29 U/L (ref 0–44)
AST: 19 U/L (ref 15–41)
Albumin: 4.4 g/dL (ref 3.5–5.0)
Alkaline Phosphatase: 77 U/L (ref 38–126)
Anion gap: 7 (ref 5–15)
BUN: 15 mg/dL (ref 6–20)
CO2: 28 mmol/L (ref 22–32)
Calcium: 9.6 mg/dL (ref 8.9–10.3)
Chloride: 102 mmol/L (ref 98–111)
Creatinine: 0.82 mg/dL (ref 0.61–1.24)
GFR, Estimated: >60 mL/min (ref >60)
Glucose, Bld: 94 mg/dL (ref 70–99)
Potassium: 3.9 mmol/L (ref 3.5–5.1)
Sodium: 137 mmol/L (ref 135–145)
Total Bilirubin: 0.5 mg/dL (ref 0.3–1.2)
Total Protein: 7.1 g/dL (ref 6.5–8.1)

## 2021-09-16 MED ORDER — IOHEXOL 350 MG/ML SOLN
80.0000 mL | Freq: Once | INTRAVENOUS | Status: AC | PRN
  Administered 2021-09-16: 18:00:00 80 mL via INTRAVENOUS

## 2021-09-16 NOTE — Telephone Encounter (Signed)
Returned his call and given phone # for pre-service center. Reminded of upcoming appts. After repeating multiple times, he verbalized understanding.

## 2021-09-16 NOTE — Progress Notes (Signed)
Met with patient at registration to complete grant process.  Patient approved for one-time $1000 [employer] grant to assist with personal expenses while going through treatment. Discussed in detail expenses and how they are covered. He received a gift card today from grant.  He has a copy of approval letter and expense sheet along with Outpatient pharmacy information and my card for any additional financial questions or concerns in green folder.

## 2021-09-16 NOTE — Progress Notes (Signed)
Received staff message regarding contacting patient.  Called and left voicemail with my direct contact name and number for him to return call. If not patient has appointment here at [facility] today and I can connect with him again at that time.

## 2021-09-16 NOTE — Progress Notes (Signed)
Patient returned my call.  We agreed to meet this afternoon.

## 2021-09-25 ENCOUNTER — Other Ambulatory Visit: Payer: Self-pay

## 2021-09-25 ENCOUNTER — Inpatient Hospital Stay (HOSPITAL_BASED_OUTPATIENT_CLINIC_OR_DEPARTMENT_OTHER): Payer: Self-pay | Admitting: Hematology and Oncology

## 2021-09-25 ENCOUNTER — Encounter: Payer: Self-pay | Admitting: Hematology and Oncology

## 2021-09-25 VITALS — BP 132/93 | HR 68 | Temp 99.3°F | Resp 18 | Ht 64.0 in | Wt 182.6 lb

## 2021-09-25 DIAGNOSIS — I77810 Thoracic aortic ectasia: Secondary | ICD-10-CM

## 2021-09-25 DIAGNOSIS — C9110 Chronic lymphocytic leukemia of B-cell type not having achieved remission: Secondary | ICD-10-CM

## 2021-09-25 NOTE — Assessment & Plan Note (Signed)
I have reviewed CT imaging with the patient The patient have Rai stage 0 with lymphocytosis only I did not order additional prognostic marker due to lack of insurance I will see him once a year for further follow-up

## 2021-09-25 NOTE — Progress Notes (Signed)
**Darren [name]** Darren Darren [name]  Patient Care Team: Ladell Pier, MD as PCP - General (Internal Medicine) Gala Romney Cristopher Estimable, MD as Consulting Physician (Gastroenterology)  ASSESSMENT & PLAN:  Chronic lymphocytic leukemia [facility]) I have reviewed CT imaging with the patient The patient have Rai stage 0 with lymphocytosis only I did not order additional prognostic marker due to lack of insurance I will see him once a year for further follow-up  Dilation of thoracic aorta ([place]) The patient has ascending aortic dilatation for many years, likely caused by previous smoking, hypertension and stress He needs yearly follow-up with CT surgeon The patient would like to wait until April when he has insurance next year We will call the patient around April before we make referral In the meantime, we discussed importance of risk factor modification and getting his blood pressure under good control  Orders Placed This Encounter  Procedures   CBC with Differential ([place] Only)    Standing Status:   Future    Standing Expiration Date:   09/25/2022    All questions were answered. The patient knows to call the clinic with any problems, questions or concerns. The total time spent in the appointment was 20 minutes encounter with patients including review of chart and various tests results, discussions about plan of care and coordination of care plan   Heath Lark, MD 09/25/2021 2:37 PM  INTERVAL HISTORY: Please see below for problem oriented charting. he returns for follow-up to review CT imaging result He is doing well The patient did say he has a stressful job  REVIEW OF SYSTEMS:   Constitutional: Denies fevers, chills or abnormal weight loss Eyes: Denies blurriness of vision Ears, nose, mouth, throat, and face: Denies mucositis or sore throat Respiratory: Denies cough, dyspnea or wheezes Cardiovascular: Denies palpitation, chest discomfort or lower extremity  swelling Gastrointestinal:  Denies nausea, heartburn or change in bowel habits Skin: Denies abnormal skin rashes Lymphatics: Denies new lymphadenopathy or easy bruising Neurological:Denies numbness, tingling or new weaknesses Behavioral/Psych: Mood is stable, no new changes  All other systems were reviewed with the patient and are negative.  I have reviewed the past medical history, past surgical history, social history and family history with the patient and they are unchanged from previous Darren [name].  ALLERGIES:  has No Known Allergies.  MEDICATIONS:  Current Outpatient Medications  Medication Sig Dispense Refill   amLODipine (NORVASC) 5 MG tablet TAKE 1 TABLET (5 MG TOTAL) BY MOUTH DAILY. 90 tablet 2   atorvastatin (LIPITOR) 10 MG tablet TAKE 1 TABLET (10 MG TOTAL) BY MOUTH DAILY. 90 tablet 2   diclofenac Sodium (VOLTAREN) 1 % GEL APPLY 4 G TOPICALLY 4 (FOUR) TIMES DAILY. 100 g 5   levothyroxine (SYNTHROID) 175 MCG tablet TAKE 1 TABLET (175 MCG TOTAL) BY MOUTH DAILY. 90 tablet 1   losartan (COZAAR) 50 MG tablet TAKE 1 TABLET (50 MG TOTAL) BY MOUTH DAILY. 90 tablet 1   methocarbamol (ROBAXIN) 500 MG tablet Take 1 tablet (500 mg total) by mouth every 8 (eight) hours as needed for muscle spasms. 40 tablet 0   ondansetron (ZOFRAN) 8 MG tablet Take 1 tablet (8 mg total) by mouth every 8 (eight) hours as needed for nausea or vomiting. 20 tablet 0   pantoprazole (PROTONIX) 40 MG tablet TAKE 1 TABLET (40 MG TOTAL) BY MOUTH DAILY. 90 tablet 1   No current facility-administered medications for this visit.    SUMMARY OF ONCOLOGIC HISTORY: Oncology History  Chronic lymphocytic  leukemia ([place])  09/05/2021 Initial Diagnosis   Chronic lymphocytic leukemia ([place])   09/05/2021 Cancer Staging   Staging form: Chronic Lymphocytic Leukemia / Small Lymphocytic Lymphoma, AJCC 8th Edition - Clinical stage from 09/05/2021: Modified Rai Stage 0 (Modified Rai risk: Low, Lymphocytosis: Present, Adenopathy: Unknown,  Organomegaly: Unknown, Anemia: Absent, Thrombocytopenia: Absent) - Signed by Heath Lark, MD on 09/05/2021 Stage prefix: Initial diagnosis    09/23/2021 Imaging   IMPRESSION: 1. Prominent bilateral external iliac lymph nodes measuring up to 1.0 cm in short axis. Otherwise, no enlarged lymph nodes seen in the chest, abdomen or pelvis. 2. Unchanged mild dilation of the ascending thoracic aorta, measuring up to 4.2 cm. Recommend annual imaging followup by CTA or MRA.      PHYSICAL EXAMINATION: ECOG PERFORMANCE STATUS: 0 - Asymptomatic  Vitals:   09/25/21 1403  BP: (!) 132/93  Pulse: 68  Resp: 18  Temp: 99.3 F (37.4 C)  SpO2: 94%   Filed Weights   09/25/21 1403  Weight: 182 lb 9.6 oz (82.8 kg)    GENERAL:alert, no distress and comfortable NEURO: alert & oriented x 3 with fluent speech, no focal motor/sensory deficits  LABORATORY DATA:  I have reviewed the data as listed    Component Value Date/Time   NA 137 09/16/2021 1551   NA 137 08/13/2021 0933   K 3.9 09/16/2021 1551   CL 102 09/16/2021 1551   CO2 28 09/16/2021 1551   GLUCOSE 94 09/16/2021 1551   BUN 15 09/16/2021 1551   BUN 15 08/13/2021 0933   CREATININE 0.82 09/16/2021 1551   CREATININE 0.79 07/09/2016 1008   CALCIUM 9.6 09/16/2021 1551   PROT 7.1 09/16/2021 1551   PROT 6.9 08/13/2021 0933   ALBUMIN 4.4 09/16/2021 1551   ALBUMIN 4.7 08/13/2021 0933   AST 19 09/16/2021 1551   ALT 29 09/16/2021 1551   ALKPHOS 77 09/16/2021 1551   BILITOT 0.5 09/16/2021 1551   GFRNONAA >60 09/16/2021 1551   GFRNONAA >89 07/09/2016 1008   GFRAA 115 08/06/2020 1701   GFRAA >89 07/09/2016 1008    No results found for: SPEP, UPEP  Lab Results  Component Value Date   WBC 17.6 (H) 09/16/2021   NEUTROABS 5.0 09/16/2021   HGB 14.4 09/16/2021   HCT 42.8 09/16/2021   MCV 81.7 09/16/2021   PLT 196 09/16/2021      Chemistry      Component Value Date/Time   NA 137 09/16/2021 1551   NA 137 08/13/2021 0933   K 3.9  09/16/2021 1551   CL 102 09/16/2021 1551   CO2 28 09/16/2021 1551   BUN 15 09/16/2021 1551   BUN 15 08/13/2021 0933   CREATININE 0.82 09/16/2021 1551   CREATININE 0.79 07/09/2016 1008      Component Value Date/Time   CALCIUM 9.6 09/16/2021 1551   ALKPHOS 77 09/16/2021 1551   AST 19 09/16/2021 1551   ALT 29 09/16/2021 1551   BILITOT 0.5 09/16/2021 1551       RADIOGRAPHIC STUDIES: I have reviewed multiple imaging studies with the patient I have personally reviewed the radiological images as listed and agreed with the findings in the report. CT CHEST ABDOMEN PELVIS W CONTRAST  Result Date: 09/17/2021 CLINICAL DATA:  Hematologic malignancy, chronic lymphocytic leukemia EXAM: CT CHEST, ABDOMEN, AND PELVIS WITH CONTRAST TECHNIQUE: Multidetector CT imaging of the chest, abdomen and pelvis was performed following the standard protocol during bolus administration of intravenous contrast. CONTRAST:  73mL OMNIPAQUE IOHEXOL 350 MG/ML SOLN COMPARISON:  Chest CT dated July 17, 2016 FINDINGS: CT CHEST FINDINGS Cardiovascular: Normal heart size. No pericardial effusion. No significant coronary artery calcifications. Mild atherosclerotic disease of the thoracic aorta. Mild dilation of the ascending thoracic aorta, measuring up to 4.2 cm, unchanged compared to prior exam. Mediastinum/Nodes: No pathologically enlarged lymph nodes seen in the chest. Lungs/Pleura: Central airways are patent. Centrilobular emphysema. No consolidation, pleural effusion or pneumothorax. No suspicious pulmonary nodules. Musculoskeletal: No chest wall mass or suspicious bone lesions identified. CT ABDOMEN PELVIS FINDINGS Hepatobiliary: No focal liver abnormality is seen. No gallstones, gallbladder wall thickening, or biliary dilatation. Pancreas: Unremarkable. No pancreatic ductal dilatation or surrounding inflammatory changes. Spleen: Normal in size without focal abnormality. Adrenals/Urinary Tract: Adrenal glands are unremarkable.  Kidneys are normal, without renal calculi, focal lesion, or hydronephrosis. Bladder is unremarkable. Stomach/Bowel: Stomach is within normal limits. Appendix appears normal. No evidence of bowel wall thickening, distention, or inflammatory changes. Vascular/Lymphatic: Mild aortic atherosclerosis. Prominent bilateral external iliac lymph nodes measuring up to 1.0 cm in short axis. Otherwise, no pathologically enlarged lymph nodes seen in the abdomen or pelvis. Reproductive: Prostate is unremarkable. Other: No abdominal wall hernia or abnormality. No abdominopelvic ascites. Musculoskeletal: No acute or significant osseous findings. IMPRESSION: 1. Prominent bilateral external iliac lymph nodes measuring up to 1.0 cm in short axis. Otherwise, no enlarged lymph nodes seen in the chest, abdomen or pelvis. 2. Unchanged mild dilation of the ascending thoracic aorta, measuring up to 4.2 cm. Recommend annual imaging followup by CTA or MRA. This recommendation follows 2010 ACCF/AHA/AATS/ACR/ASA/SCA/SCAI/SIR/STS/SVM Guidelines for the Diagnosis and Management of Patients with Thoracic Aortic Disease. Circulation. 2010; 121: F027-X412. Aortic aneurysm NOS (ICD10-I71.9) 3. Mild aortic Atherosclerosis (ICD10-I70.0). Electronically Signed   By: Yetta Glassman M.D.   On: 09/17/2021 13:03

## 2021-09-25 NOTE — Assessment & Plan Note (Signed)
The patient has ascending aortic dilatation for many years, likely caused by previous smoking, hypertension and stress He needs yearly follow-up with CT surgeon The patient would like to wait until April when he has insurance next year We will call the patient around April before we make referral In the meantime, we discussed importance of risk factor modification and getting his blood pressure under good control

## 2021-09-29 ENCOUNTER — Other Ambulatory Visit: Payer: Self-pay

## 2021-10-08 ENCOUNTER — Ambulatory Visit: Payer: Self-pay | Admitting: Physician Assistant

## 2021-10-08 ENCOUNTER — Other Ambulatory Visit: Payer: Self-pay

## 2021-10-08 VITALS — BP 137/96 | HR 44 | Temp 98.2°F | Resp 18 | Ht 64.0 in | Wt 181.0 lb

## 2021-10-08 DIAGNOSIS — J302 Other seasonal allergic rhinitis: Secondary | ICD-10-CM

## 2021-10-08 DIAGNOSIS — H60393 Other infective otitis externa, bilateral: Secondary | ICD-10-CM

## 2021-10-08 MED ORDER — CETIRIZINE HCL 10 MG PO TABS
10.0000 mg | ORAL_TABLET | Freq: Every day | ORAL | 11 refills | Status: DC
  Filled 2021-10-08: qty 30, 30d supply, fill #0

## 2021-10-08 MED ORDER — NEOMYCIN-POLYMYXIN-HC 3.5-10000-1 OT SOLN
4.0000 [drp] | Freq: Four times a day (QID) | OTIC | 0 refills | Status: DC
  Filled 2021-10-08: qty 10, 4d supply, fill #0

## 2021-10-08 NOTE — Progress Notes (Signed)
Acute Office Visit  Subjective:    Patient ID: Darren [name], male    DOB: 12-03-1960, 61 y.o.   MRN: [id number]  Chief Complaint  Patient presents with   Ear Pain   Darren [name] presents today with complaint of pain/soreness in both ears. He states that the left ear is bothering him more. The patient states that the problem began yesterday, and has been accompanied by itching. He typically wears hearing aids in both ears, but this problem has made it too painful to put his left hearing aid in, so he is only wearing one hearing aid today. He says that he cleans his hearing aids daily.   The patient admits he has used cotton swabs in the ear, due to how itchy his ears are. He says he has tried some over the counter ear drops from the drugstore, and those seemed to help somewhat. He also says he lets the water from the shower flush through his ear, which he says provides some relief.   The patient denies any rhinorrhea, congestion, fevers, or headache. He has no other complaints at this time.   Past Medical History:  Diagnosis Date   Arthritis    per Medical clearance form.   Ascending aortic aneurysm 06/2016   4.2 cm   Barrett esophagus 2001   path report in EPIC 2001 c/w Barrett's. endoscopy report unavailable.   COPD (chronic obstructive pulmonary disease) (HCC)    per medical clearance form.   Depression    per Medical Clearance form(09/18/11)   GERD (gastroesophageal reflux disease)    per Medical clearance form.   Hypertension    Per Medical Clearance form.   Hypothyroidism    per Medical Clearance form.    Past Surgical History:  Procedure Laterality Date   BIOPSY  09/18/2016   Procedure: BIOPSY;  Surgeon: Corbin Ade, MD;  Location: [facility];  Service: Endoscopy;;  biopsy of distal esophagus   ESOPHAGOGASTRODUODENOSCOPY  2001   barrett's per path   ESOPHAGOGASTRODUODENOSCOPY (EGD) WITH PROPOFOL N/A 09/18/2016   Procedure: ESOPHAGOGASTRODUODENOSCOPY (EGD)  WITH PROPOFOL;  Surgeon: Corbin Ade, MD;  Location: [facility];  Service: Endoscopy;  Laterality: N/A;  1200   HEMORRHOID SURGERY     1982   MULTIPLE EXTRACTIONS WITH ALVEOLOPLASTY  11/30/2011   Procedure: MULTIPLE EXTRACION WITH ALVEOLOPLASTY;  Surgeon: Georgia Lopes, DDS;  Location: [facility] OR;  Service: Oral Surgery;  Laterality: Bilateral;    Family History  Problem Relation Age of Onset   Cancer Mother        lung cancer   Lung cancer Mother    Cancer Father        lung   Stroke Father    Heart attack Father 67   Thyroid disease Sister    Cancer Maternal Aunt        stomach   Colon cancer Neg Hx    Esophageal cancer Neg Hx     Social History   Socioeconomic History   Marital status: Divorced    Spouse name: Not on file   Number of children: 2   Years of education: Not on file   Highest education level: Not on file  Occupational History   Occupation: Administrator, [employer]  Tobacco Use   Smoking status: Former    Packs/day: 1.00    Years: 26.00    Pack years: 26.00    Types: Cigarettes    Quit date: 09/15/2004    Years since quitting:  17.0   Smokeless tobacco: Never   Tobacco comments:    Quit in 2005  Substance and Sexual Activity   Alcohol use: Yes    Comment: ON WEEKENDS   Drug use: No   Sexual activity: Not Currently    Birth control/protection: None  Other Topics Concern   Not on file  Social History Narrative   Lives alone.     Social Determinants of Health   Financial Resource Strain: Not on file  Food Insecurity: Not on file  Transportation Needs: Not on file  Physical Activity: Not on file  Stress: Not on file  Social Connections: Not on file  Intimate Partner Violence: Not on file    Outpatient Medications Prior to Visit  Medication Sig Dispense Refill   amLODipine (NORVASC) 5 MG tablet TAKE 1 TABLET (5 MG TOTAL) BY MOUTH DAILY. 90 tablet 2   atorvastatin (LIPITOR) 10 MG tablet TAKE 1 TABLET (10 MG TOTAL) BY MOUTH DAILY. 90 tablet 2    diclofenac Sodium (VOLTAREN) 1 % GEL APPLY 4 G TOPICALLY 4 (FOUR) TIMES DAILY. 100 g 5   levothyroxine (SYNTHROID) 175 MCG tablet TAKE 1 TABLET (175 MCG TOTAL) BY MOUTH DAILY. 90 tablet 1   losartan (COZAAR) 50 MG tablet TAKE 1 TABLET (50 MG TOTAL) BY MOUTH DAILY. 90 tablet 1   methocarbamol (ROBAXIN) 500 MG tablet Take 1 tablet (500 mg total) by mouth every 8 (eight) hours as needed for muscle spasms. 40 tablet 0   pantoprazole (PROTONIX) 40 MG tablet TAKE 1 TABLET (40 MG TOTAL) BY MOUTH DAILY. 90 tablet 1   ondansetron (ZOFRAN) 8 MG tablet Take 1 tablet (8 mg total) by mouth every 8 (eight) hours as needed for nausea or vomiting. (Patient not taking: Reported on 10/08/2021) 20 tablet 0   No facility-administered medications prior to visit.    No Known Allergies  Review of Systems  Constitutional:  Negative for fever.  HENT:  Positive for ear pain and hearing loss. Negative for congestion, ear discharge, rhinorrhea and sore throat.   Eyes: Negative.   Respiratory:  Positive for cough. Negative for shortness of breath.   Cardiovascular: Negative.   Gastrointestinal:  Negative for diarrhea, nausea and vomiting.  Endocrine: Negative.   Genitourinary: Negative.   Musculoskeletal: Negative.   Skin: Negative.   Allergic/Immunologic: Negative.   Neurological:  Negative for dizziness, syncope and headaches.  Psychiatric/Behavioral: Negative.        Objective:    Physical Exam Constitutional:      Appearance: Normal appearance. He is obese.  HENT:     Head: Normocephalic and atraumatic.     Right Ear: External ear normal. No decreased hearing noted. Tenderness present. Impacted cerumen: diffuse irritation, some cerumen present, not impacted.     Left Ear: External ear normal. No decreased hearing noted. Tenderness present. Impacted cerumen: diffuse irritation, some cerumen present, not impacted.     Nose: Nose normal.     Mouth/Throat:     Mouth: Mucous membranes are moist.      Pharynx: Oropharynx is clear.  Eyes:     Extraocular Movements: Extraocular movements intact.     Conjunctiva/sclera: Conjunctivae normal.     Pupils: Pupils are equal, round, and reactive to light.  Cardiovascular:     Rate and Rhythm: Normal rate and regular rhythm.     Pulses: Normal pulses.     Heart sounds: Normal heart sounds.  Pulmonary:     Effort: Pulmonary effort is normal.  Breath sounds: Normal breath sounds.  Musculoskeletal:        General: Normal range of motion.     Cervical back: Normal range of motion and neck supple.  Skin:    General: Skin is warm and dry.  Neurological:     General: No focal deficit present.     Mental Status: He is alert and oriented to person, place, and time.  Psychiatric:        Mood and Affect: Mood normal.        Behavior: Behavior normal.        Thought Content: Thought content normal.        Judgment: Judgment normal.    BP (!) 137/96 (BP Location: Left Arm, Patient Position: Sitting, Cuff Size: Normal)    Pulse (!) 44    Temp 98.2 F (36.8 C) (Oral)    Resp 18    Ht $R'5\' 4"'UY$  (1.626 m)    Wt 181 lb (82.1 kg)    SpO2 94%    BMI 31.07 kg/m  Wt Readings from Last 3 Encounters:  10/08/21 181 lb (82.1 kg)  09/25/21 182 lb 9.6 oz (82.8 kg)  09/05/21 183 lb 3.2 oz (83.1 kg)    Health Maintenance Due  Topic Date Due   COVID-19 Vaccine (1) Never done   Pneumococcal Vaccine 66-21 Years old (1 - PCV) Never done   Zoster Vaccines- Shingrix (1 of 2) Never done   Fecal DNA (Cologuard)  02/16/2021    There are no preventive care reminders to display for this patient.   Lab Results  Component Value Date   TSH 1.590 08/13/2021   Lab Results  Component Value Date   WBC 17.6 (H) 09/16/2021   HGB 14.4 09/16/2021   HCT 42.8 09/16/2021   MCV 81.7 09/16/2021   PLT 196 09/16/2021   Lab Results  Component Value Date   NA 137 09/16/2021   K 3.9 09/16/2021   CO2 28 09/16/2021   GLUCOSE 94 09/16/2021   BUN 15 09/16/2021   CREATININE  0.82 09/16/2021   BILITOT 0.5 09/16/2021   ALKPHOS 77 09/16/2021   AST 19 09/16/2021   ALT 29 09/16/2021   PROT 7.1 09/16/2021   ALBUMIN 4.4 09/16/2021   CALCIUM 9.6 09/16/2021   ANIONGAP 7 09/16/2021   EGFR 100 08/13/2021   Lab Results  Component Value Date   CHOL 157 08/13/2021   Lab Results  Component Value Date   HDL 44 08/13/2021   Lab Results  Component Value Date   LDLCALC 88 08/13/2021   Lab Results  Component Value Date   TRIG 139 08/13/2021   Lab Results  Component Value Date   CHOLHDL 3.6 08/13/2021   Lab Results  Component Value Date   HGBA1C 5 4 11/21/2015       Assessment & Plan:   Problem List Items Addressed This Visit   None Visit Diagnoses     Infective otitis externa of both ears    -  Primary   Relevant Medications   neomycin-polymyxin-hydrocortisone (CORTISPORIN) OTIC solution   Seasonal allergies       Relevant Medications   cetirizine (ZYRTEC) 10 MG tablet     1. Infective otitis externa of both ears Trial this medication. Education given on supportive care. Red flag symptoms for prompt evaluation given. - neomycin-polymyxin-hydrocortisone (CORTISPORIN) OTIC solution; Place 4 drops into both ears 4 (four) times daily.  Dispense: 10 mL; Refill: 0  2. Seasonal allergies Trial cetirizine to help  manage seasonal allergy symptoms including ear itching.  - cetirizine (ZYRTEC) 10 MG tablet; Take 1 tablet (10 mg total) by mouth daily.  Dispense: 30 tablet; Refill: 11    Meds ordered this encounter  Medications   cetirizine (ZYRTEC) 10 MG tablet    Sig: Take 1 tablet (10 mg total) by mouth daily.    Dispense:  30 tablet    Refill:  11    Order Specific Question:   Supervising Provider    Answer:   Elsie Stain [[id number]]   neomycin-polymyxin-hydrocortisone (CORTISPORIN) OTIC solution    Sig: Place 4 drops into both ears 4 (four) times daily.    Dispense:  10 mL    Refill:  0    Please fill least expensive version    Order  Specific Question:   Supervising Provider    Answer:   Elsie Stain [[id number]]    I have reviewed the patient's medical history (PMH, PSH, Social History, Family History, Medications, and allergies) , and have been updated if relevant. I spent 20 minutes reviewing chart and  face to face time with patient.  Loraine Grip Mayers, PA-C

## 2021-10-08 NOTE — Patient Instructions (Addendum)
You are going to use prescription eardrops in both ears 4 times daily until your symptoms have resolved.  I do encourage you to follow-up with audiology as soon as you are able.  I encourage you to start taking Zyrtec on a daily basis as well.  Please let us know if there is anything else we can do for you.  Darren Rad, PA-C Physician Assistant [facility] https: Otitis Externa Otitis externa is an infection of the outer ear canal. The outer ear canal is the area between the outside of the ear and the eardrum. Otitis externa is sometimes called swimmer's ear. What are the causes? Common causes of this condition include: Swimming in dirty water. Moisture in the ear. An injury to the inside of the ear. An object stuck in the ear. A cut or scrape on the outside of the ear or in the ear canal. What increases the risk? You are more likely to develop this condition if you go swimming often. What are the signs or symptoms? The first symptom of this condition is often itching in the ear. Later symptoms of the condition include: Swelling of the ear. Redness in the ear. Ear pain. The pain may get worse when you pull on your ear. Pus coming from the ear. How is this diagnosed? This condition may be diagnosed by examining the ear and testing fluid from the ear for bacteria and funguses. How is this treated? This condition may be treated with: Antibiotic ear drops. These are often given for 10-14 days. Medicines to reduce itching and swelling. Follow these instructions at home: If you were prescribed antibiotic ear drops, use them as told by your health care provider. Do not stop using the antibiotic even if you start to feel better. Take over-the-counter and prescription medicines only as told by your health care provider. Avoid getting water in your ears as told by your health care provider. This may include avoiding swimming or water sports for a few days. Keep all  follow-up visits. This is important. How is this prevented? Keep your ears dry. Use the corner of a towel to dry your ears after you swim or bathe. Avoid scratching or putting things in your ear. Doing these things can damage the ear canal or remove the protective wax that lines it, which makes it easier for bacteria and funguses to grow. Avoid swimming in lakes, polluted water, or swimming pools that may not have enough chlorine. Contact a health care provider if: You have a fever. Your ear is still red, swollen, painful, or draining pus after 3 days. Your redness, swelling, or pain gets worse. You have a severe headache. Get help right away if: You have redness, swelling, and pain or tenderness in the area behind your ear. Summary Otitis externa is an infection of the outer ear canal. Common causes include swimming in dirty water, moisture in the ear, or a cut or scrape in the ear. Symptoms include pain, redness, and swelling of the ear canal. If you were prescribed antibiotic ear drops, use them as told by your health care provider. Do not stop using the antibiotic even if you start to feel better. This information is not intended to replace advice given to you by your health care provider. Make sure you discuss any questions you have with your health care provider. Document Revised: 11/27/2020 Document Reviewed: 11/27/2020 Elsevier Patient Education  2022 [employer]. [email or web address]

## 2021-10-08 NOTE — Progress Notes (Signed)
Patient complains of bilateral ear itching for the past few weeks. Patient uses q-tip for relief which patient believes has made concern worse. Patient denies bleeding or fluid from ears. Patient has taken chronic medication today and patient has eaten. Patient denies pain at this time.

## 2021-10-09 ENCOUNTER — Other Ambulatory Visit: Payer: Self-pay

## 2021-10-10 ENCOUNTER — Other Ambulatory Visit: Payer: Self-pay

## 2021-10-27 ENCOUNTER — Other Ambulatory Visit: Payer: Self-pay

## 2021-11-05 ENCOUNTER — Other Ambulatory Visit: Payer: Self-pay

## 2021-11-14 ENCOUNTER — Encounter: Payer: Self-pay | Admitting: Hematology and Oncology

## 2021-11-14 NOTE — Progress Notes (Signed)
Patient called to discuss payment arrangements for his bill. Provided him contact number for customer service/billing at [phone] to discuss these arrangements. He verbalized understanding.  Patient called back to inform he was told in billing to hold off on making a payment and they would send him an application to apply for possible assistance. Advised him to be sure to complete application and submit all supporting documents with application. He verbalized understanding.  He has my card for any additional financial questions or concerns.

## 2021-11-28 ENCOUNTER — Other Ambulatory Visit: Payer: Self-pay

## 2021-12-05 ENCOUNTER — Other Ambulatory Visit: Payer: Self-pay

## 2021-12-17 ENCOUNTER — Other Ambulatory Visit: Payer: Self-pay

## 2021-12-19 ENCOUNTER — Encounter: Payer: Self-pay | Admitting: Hematology and Oncology

## 2021-12-19 NOTE — Progress Notes (Signed)
Patient dropped off CFA application at [facility] 12/17/21. ? ? ?Forwarded scan to hospital financial counselor to scan into chart for processing. ?

## 2021-12-26 ENCOUNTER — Telehealth: Payer: Self-pay | Admitting: Nurse Practitioner

## 2021-12-26 ENCOUNTER — Other Ambulatory Visit: Payer: Self-pay | Admitting: Internal Medicine

## 2021-12-26 ENCOUNTER — Ambulatory Visit: Payer: Self-pay

## 2021-12-26 DIAGNOSIS — H60393 Other infective otitis externa, bilateral: Secondary | ICD-10-CM

## 2021-12-26 DIAGNOSIS — I10 Essential (primary) hypertension: Secondary | ICD-10-CM

## 2021-12-26 DIAGNOSIS — E782 Mixed hyperlipidemia: Secondary | ICD-10-CM

## 2021-12-26 DIAGNOSIS — H6505 Acute serous otitis media, recurrent, left ear: Secondary | ICD-10-CM

## 2021-12-26 MED ORDER — AMOXICILLIN 875 MG PO TABS: 875.0000 mg | ORAL_TABLET | Freq: Two times a day (BID) | ORAL | 0 refills | Status: DC

## 2021-12-26 MED ORDER — OFLOXACIN 0.3 % OT SOLN: 5.0000 [drp] | Freq: Every day | OTIC | 0 refills | Status: DC

## 2021-12-26 NOTE — Telephone Encounter (Signed)
Pt is out of these meds, please send short supply/Medication Refill - Medication:neomycin-polymyxin-hydrocortisone (CORTISPORIN) OTIC solution losartan (COZAAR) 50 MG tablet atorvastatin (LIPITOR) 10 MG tablet  ? ?Has the patient contacted their pharmacy? yes ?(Agent: If no, request that the patient contact the pharmacy for the refill. If patient does not wish to contact the pharmacy document the reason why and proceed with request.) ?(Agent: If yes, when and what did the pharmacy advise?)no answer with pharmacy, with pt or me ? ?Preferred Pharmacy (with phone number or street name):neomycin-polymyxin-hydrocortisone (CORTISPORIN) OTIC solution losartan (COZAAR) 50 MG tablet atorvastatin (LIPITOR) 10 MG tablet  ?Has the patient been seen for an appointment in the last year OR does the patient have an upcoming appointment? yes ? ?Agent: Please be advised that RX refills may take up to 3 business days. We ask that you follow-up with your pharmacy.  ?

## 2021-12-26 NOTE — Telephone Encounter (Signed)
? ? ?  Chief Complaint: Bilateral ear pain 12/10 ?Symptoms: ear pain ?Frequency: started January - got better - now back ?Pertinent Negatives: Patient denies fever. Dizziness ?Disposition: '[]'$ ED /'[]'$ Urgent Care (no appt availability in office) / '[x]'$ Appointment(In office/virtual)/ '[]'$  [place] [facility]/ '[]'$ Home Care/ '[]'$ Refused Recommended Disposition /'[]'$ [facility]/ '[]'$  Follow-up with PCP ?Additional Notes: Pt has the same ear pain as before. Pt requested refill of eardrops earlier today. Made virtual appointment for this afternoon. ? ?Reason for Disposition ? [1] SEVERE pain AND [2] not improved 2 hours after taking analgesic medication (e.g., ibuprofen or acetaminophen) ? ?Answer Assessment - Initial Assessment Questions ?1. LOCATION: "Which ear is involved?" ?    both ?2. ONSET: "When did the ear start hurting"  ?    10/08/2021 - got better now back ?3. SEVERITY: "How bad is the pain?"  (Scale 1-10; mild, moderate or severe) ?  - MILD (1-3): doesn't interfere with normal activities  ?  - MODERATE (4-7): interferes with normal activities or awakens from sleep  ?  - SEVERE (8-10): excruciating pain, unable to do any normal activities  ?    12/10 ?4. URI SYMPTOMS: "Do you have a runny nose or cough?" ?    no ?5. FEVER: "Do you have a fever?" If Yes, ask: "What is your temperature, how was it measured, and when did it start?" ?    no ?6. CAUSE: "Have you been swimming recently?", "How often do you use Q-TIPS?", "Have you had any recent air travel or scuba diving?" ?    no ?7. OTHER SYMPTOMS: "Do you have any other symptoms?" (e.g., headache, stiff neck, dizziness, vomiting, runny nose, decreased hearing) ?    no ?8. PREGNANCY: "Is there any chance you are pregnant?" "When was your last menstrual period?" ?    na ? ?Protocols used: Earache-A-AH ? ?

## 2021-12-26 NOTE — Patient Instructions (Signed)
Otitis Media, Adult ?Otitis media is a condition in which the middle ear is red and swollen (inflamed) and full of fluid. The middle ear is the part of the ear that contains bones for hearing as well as air that helps send sounds to the brain. The condition usually goes away on its own. ?What are the causes? ?This condition is caused by a blockage in the eustachian tube. This tube connects the middle ear to the back of the nose. It normally allows air into the middle ear. The blockage is caused by fluid or swelling. Problems that can cause blockage include: ?A cold or infection that affects the nose, mouth, or throat. ?Allergies. ?An irritant, such as tobacco smoke. ?Adenoids that have become large. The adenoids are soft tissue located in the back of the throat, behind the nose and the roof of the mouth. ?Growth or swelling in the upper part of the throat, just behind the nose (nasopharynx). ?Damage to the ear caused by a change in pressure. This is called barotrauma. ?What increases the risk? ?You are more likely to develop this condition if you: ?Smoke or are exposed to tobacco smoke. ?Have an opening in the roof of your mouth (cleft palate). ?Have acid reflux. ?Have problems in your body's defense system (immune system). ?What are the signs or symptoms? ?Symptoms of this condition include: ?Ear pain. ?Fever. ?Problems with hearing. ?Being tired. ?Fluid leaking from the ear. ?Ringing in the ear. ?How is this treated? ?This condition can go away on its own within 3-5 days. But if the condition is caused by germs (bacteria) and does not go away on its own, or if it keeps coming back, your doctor may: ?Give you antibiotic medicines. ?Give you medicines for pain. ?Follow these instructions at home: ?Take over-the-counter and prescription medicines only as told by your doctor. ?If you were prescribed an antibiotic medicine, take it as told by your doctor. Do not stop taking it even if you start to feel better. ?Keep  all follow-up visits. ?Contact a doctor if: ?You have bleeding from your nose. ?There is a lump on your neck. ?You are not feeling better in 5 days. ?You feel worse instead of better. ?Get help right away if: ?You have pain that is not helped with medicine. ?You have swelling, redness, or pain around your ear. ?You get a stiff neck. ?You cannot move part of your face (paralysis). ?You notice that the bone behind your ear hurts when you touch it. ?You get a very bad headache. ?Summary ?Otitis media means that the middle ear is red, swollen, and full of fluid. ?This condition usually goes away on its own. ?If the problem does not go away, treatment may be needed. You may be given medicines to treat the infection or to treat your pain. ?If you were prescribed an antibiotic medicine, take it as told by your doctor. Do not stop taking it even if you start to feel better. ?Keep all follow-up visits. ?This information is not intended to replace advice given to you by your health care provider. Make sure you discuss any questions you have with your health care provider. ?Document Revised: 12/23/2020 Document Reviewed: 12/23/2020 ?Elsevier Patient Education ? Darren [name]. ? ?

## 2021-12-26 NOTE — Progress Notes (Signed)
? ?Virtual Visit Consent  ? ?Darren [name], you are scheduled for a virtual visit with Darren [name], [place], a [facility] provider, today.   ?  ?Just as with appointments in the office, your consent must be obtained to participate.  Your consent will be active for this visit and any virtual visit you may have with one of our providers in the next 365 days.   ?  ?If you have a MyChart account, a copy of this consent can be sent to you electronically.  All virtual visits are billed to your insurance company just like a traditional visit in the office.   ? ?As this is a virtual visit, video technology does not allow for your provider to perform a traditional examination.  This may limit your provider's ability to fully assess your condition.  If your provider identifies any concerns that need to be evaluated in person or the need to arrange testing (such as labs, EKG, etc.), we will make arrangements to do so.   ?  ?Although advances in technology are sophisticated, we cannot ensure that it will always work on either your end or our end.  If the connection with a video visit is poor, the visit may have to be switched to a telephone visit.  With either a video or telephone visit, we are not always able to ensure that we have a secure connection.    ? ?I need to obtain your verbal consent now.   Are you willing to proceed with your visit today? YES ?  ?ALBEN JEPSEN has provided verbal consent on 12/26/2021 for a virtual visit (video or telephone). ?  ?Lost video connection. ? ?Darren Hassell Done, FNP  ? ? ?Date: 12/26/2021 5:45 PM ? ? ?Virtual Visit via Video Note  ? ?I, Darren [name], connected with Darren [name] ([id number], 06-11-1961) on 12/26/21 at  6:00 PM EDT by a video-enabled telemedicine application and verified that I am speaking with the correct person using two identifiers. ? ?Location: ?Patient: Virtual Visit Location Patient: Home ?Provider: Virtual Visit Location Provider: Mobile ?  ?I  discussed the limitations of evaluation and management by telemedicine and the availability of in person appointments. The patient expressed understanding and agreed to proceed.   ? ?History of Present Illness: ?Darren [name] is a 61 y.o. who identifies as a male who was assigned male at birth, and is being seen today for otalgia. ? ?HPI: Otalgia  ?There is pain in the left ear. This is a new problem. Episode onset: 3 days ago. The problem occurs constantly. The problem has been gradually worsening (he wear a hearing aid and he gets frequent otitis media). There has been no fever. The pain is at a severity of 7/10. The pain is moderate. Pertinent negatives include no coughing. He has tried acetaminophen for the symptoms. The treatment provided mild relief.   ?Review of Systems  ?HENT:  Positive for ear pain.   ?Respiratory:  Negative for cough.   ? ?Problems:  ?Patient Active Problem List  ? Diagnosis Date Noted  ? Infective otitis externa of both ears 10/08/2021  ? Seasonal allergies 10/08/2021  ? Chronic lymphocytic leukemia ([place]) 09/05/2021  ? Ascending aortic aneurysm 01/16/2021  ? Hyperlipidemia 12/09/2020  ? Lipoma of neck 11/24/2019  ? Class 1 obesity due to excess calories without serious comorbidity with body mass index (BMI) of 31.0 to 31.9 in adult 11/04/2017  ? 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 Spondylosis without myelopathy  or radiculopathy, lumbar region 12/21/2016  ? Lumbar degenerative disc disease 12/21/2016  ? 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 HTN (hypertension) 07/31/2016  ? 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 Esophageal dysphagia 07/09/2016  ? Chronic midline low back pain 07/09/2016  ? Sensorineural hearing loss (SNHL), bilateral 03/30/2016  ? Vitamin D deficiency 04/06/2014  ? Hypothyroidism 10/31/2013  ? GERD (gastroesophageal reflux disease) 11/25/2011  ? Barrett esophagus 09/29/1999  ?  ?Allergies: No Known  Allergies ?Medications:  ?Current Outpatient Medications:  ?  amLODipine (NORVASC) 5 MG tablet, TAKE 1 TABLET (5 MG TOTAL) BY MOUTH DAILY., Disp: 90 tablet, Rfl: 2 ?  atorvastatin (LIPITOR) 10 MG tablet, TAKE 1 TABLET (10 MG TOTAL) BY MOUTH DAILY., Disp: 90 tablet, Rfl: 2 ?  cetirizine (ZYRTEC) 10 MG tablet, Take 1 tablet (10 mg total) by mouth daily., Disp: 30 tablet, Rfl: 11 ?  diclofenac Sodium (VOLTAREN) 1 % GEL, APPLY 4 G TOPICALLY 4 (FOUR) TIMES DAILY., Disp: 100 g, Rfl: 5 ?  levothyroxine (SYNTHROID) 175 MCG tablet, TAKE 1 TABLET (175 MCG TOTAL) BY MOUTH DAILY., Disp: 90 tablet, Rfl: 1 ?  losartan (COZAAR) 50 MG tablet, TAKE 1 TABLET (50 MG TOTAL) BY MOUTH DAILY., Disp: 90 tablet, Rfl: 1 ?  methocarbamol (ROBAXIN) 500 MG tablet, Take 1 tablet (500 mg total) by mouth every 8 (eight) hours as needed for muscle spasms., Disp: 40 tablet, Rfl: 0 ?  neomycin-polymyxin-hydrocortisone (CORTISPORIN) OTIC solution, Place 4 drops into both ears 4 (four) times daily., Disp: 10 mL, Rfl: 0 ?  ondansetron (ZOFRAN) 8 MG tablet, Take 1 tablet (8 mg total) by mouth every 8 (eight) hours as needed for nausea or vomiting. (Patient not taking: Reported on 10/08/2021), Disp: 20 tablet, Rfl: 0 ?  pantoprazole (PROTONIX) 40 MG tablet, TAKE 1 TABLET (40 MG TOTAL) BY MOUTH DAILY., Disp: 90 tablet, Rfl: 1 ? ?Observations/Objective: ?Patient is well-developed, well-nourished in no acute distress.  ?Resting comfortably  at home.  ?Head is normocephalic, atraumatic.  ?No labored breathing.  ?Speech is clear and coherent with logical content.  ?Patient is alert and oriented at baseline.  ?Unable to wear hearing aid in left ear due  to pain ? ?Assessment and Plan: ? ?Darren [name] in today with chief complaint of Otalgia ? ? ?1. Recurrent acute serous otitis media of left ear ? ?Motrin or tylenol for pain ?Force fluids ?OTC decongestant ?Meds ordered this encounter  ?Medications  ? amoxicillin (AMOXIL) 875 MG tablet  ?  Sig: Take 1 tablet  (875 mg total) by mouth 2 (two) times daily. 1 po BID  ?  Dispense:  20 tablet  ?  Refill:  0  ?  Order Specific Question:   Supervising Provider  ?  Answer:   Noemi Chapel [[id number]]  ? ofloxacin (FLOXIN OTIC) 0.3 % OTIC solution  ?  Sig: Place 5 drops into both ears daily.  ?  Dispense:  10 mL  ?  Refill:  0  ?  Order Specific Question:   Supervising Provider  ?  Answer:   Noemi Chapel [[id number]]  ? ? ? ? ?Follow Up Instructions: ?I discussed the assessment and treatment plan with the patient. The patient was provided an opportunity to ask questions and all were answered. The patient agreed with the plan and demonstrated an understanding of the instructions.  A copy of instructions were sent to the patient via MyChart. ? ?The patient was advised to call back or seek  an in-person evaluation if the symptoms worsen or if the condition fails to improve as anticipated. ? ?Time:  ?I spent 10 minutes with the patient via telehealth technology discussing the above problems/concerns.   ? ?Darren Hassell Done, FNP ? ?

## 2021-12-26 NOTE — Telephone Encounter (Signed)
This is the [place] at [facility]  ?[address], [place] [address]  ?Phone: [phone] Fax: [phone]  ?Hours: M-F 7:30a-6:00p  ? ?

## 2021-12-29 ENCOUNTER — Telehealth: Payer: Self-pay

## 2021-12-29 ENCOUNTER — Other Ambulatory Visit: Payer: Self-pay | Admitting: Hematology and Oncology

## 2021-12-29 ENCOUNTER — Other Ambulatory Visit: Payer: Self-pay

## 2021-12-29 DIAGNOSIS — I77810 Thoracic aortic ectasia: Secondary | ICD-10-CM

## 2021-12-29 MED ORDER — LOSARTAN POTASSIUM 50 MG PO TABS
ORAL_TABLET | Freq: Every day | ORAL | 0 refills | Status: DC
  Filled 2021-12-29: qty 90, 90d supply, fill #0

## 2021-12-29 MED ORDER — ATORVASTATIN CALCIUM 10 MG PO TABS
ORAL_TABLET | Freq: Every day | ORAL | 2 refills | Status: DC
  Filled 2021-12-29: qty 90, 90d supply, fill #0
  Filled 2022-03-26: qty 90, 90d supply, fill #1
  Filled 2022-06-25 – 2022-07-10 (×3): qty 90, 90d supply, fill #2

## 2021-12-29 NOTE — Telephone Encounter (Signed)
Requested medications are due for refill today.  Unsure ?  ?Requested medications are on the active medications list.  yes ?  ?Last refill. 10/08/2021 ?  ?Future visit scheduled.   yes ?  ?Notes to clinic.  Medication requires manual review. ?  ? ? ?Requested Prescriptions  ?Pending Prescriptions Disp Refills  ? neomycin-polymyxin-hydrocortisone (CORTISPORIN) OTIC solution 10 mL 0  ?  Sig: Place 4 drops into both ears 4 (four) times daily.  ?  ? Off-Protocol Failed - 12/26/2021  4:49 PM  ?  ?  Failed - Medication not assigned to a protocol, review manually.  ?  ?  Passed - Valid encounter within last 12 months  ?  Recent Outpatient Visits   ? ?      ? 4 months ago Essential hypertension  ? [place], [place], [place]  ? 11 months ago Essential hypertension  ? [place] Ladell Pier, MD  ? 1 year ago Essential hypertension  ? [place] Elsie Stain, MD  ? 1 year ago Essential hypertension  ? [place], NP  ? 1 year ago Essential hypertension  ? [place], [place], NP  ? ?  ?  ?Future Appointments   ? ?        ? In 1 month Ladell Pier, MD [place]  ? ?  ? ?  ?  ?  ?Signed Prescriptions Disp Refills  ? losartan (COZAAR) 50 MG tablet 90 tablet 0  ?  Sig: TAKE 1 TABLET (50 MG TOTAL) BY MOUTH DAILY.  ?  ? Cardiovascular:  Angiotensin Receptor Blockers Failed - 12/26/2021  4:49 PM  ?  ?  Failed - Last BP in normal range  ?  BP Readings from Last 1 Encounters:  ?10/08/21 (!) 137/96  ?  ?  ?  ?  Passed - Cr in normal range and within 180 days  ?  Creatinine  ?Date Value Ref Range Status  ?09/16/2021 0.82 0.61 - 1.24 mg/dL Final  ? ?Creat  ?Date Value Ref Range Status  ?07/09/2016 0.79 0.70 - 1.33 mg/dL Final  ?  Comment:  ?    ?For patients > or = 61 years of age: The upper reference limit  for ?Creatinine is approximately 13% higher for people identified as ?African-American. ?  ?  ?  ?  ?  ?  Passed - K in normal range and within 180 days  ?  Potassium  ?Date Value Ref Range Status  ?09/16/2021 3.9 3.5 - 5.1 mmol/L Final  ?  ?  ?  ?  Passed - Patient is not pregnant  ?  ?  Passed - Valid encounter within last 6 months  ?  Recent Outpatient Visits   ? ?      ? 4 months ago Essential hypertension  ? [place], [place], [place]  ? 11 months ago Essential hypertension  ? [place] Ladell Pier, MD  ? 1 year ago Essential hypertension  ? [place] Elsie Stain, MD  ? 1 year ago Essential hypertension  ? [place], NP  ? 1 year ago Essential hypertension  ? [place], [place]  J, NP  ? ?  ?  ?Future Appointments   ? ?        ? In 1 month Ladell Pier, MD [place]  ? ?  ? ?  ?  ?  ? atorvastatin (LIPITOR) 10 MG tablet 90 tablet 2  ?  Sig: TAKE 1 TABLET (10 MG TOTAL) BY MOUTH DAILY.  ?  ? Cardiovascular:  Antilipid - Statins Failed - 12/26/2021  4:49 PM  ?  ?  Failed - Lipid Panel in normal range within the last 12 months  ?  Cholesterol, Total  ?Date Value Ref Range Status  ?08/13/2021 157 100 - 199 mg/dL Final  ? ?LDL Chol Calc (NIH)  ?Date Value Ref Range Status  ?08/13/2021 88 0 - 99 mg/dL Final  ? ?HDL  ?Date Value Ref Range Status  ?08/13/2021 44 >39 mg/dL Final  ? ?Triglycerides  ?Date Value Ref Range Status  ?08/13/2021 139 0 - 149 mg/dL Final  ? ?  ?  ?  Passed - Patient is not pregnant  ?  ?  Passed - Valid encounter within last 12 months  ?  Recent Outpatient Visits   ? ?      ? 4 months ago Essential hypertension  ? [place], [place], [place]  ? 11 months ago Essential hypertension  ? [place]  Ladell Pier, MD  ? 1 year ago Essential hypertension  ? [place] Elsie Stain, MD  ? 1 year ago Essential hypertension  ? [place], NP  ? 1 year ago Essential hypertension  ? [place], [place], NP  ? ?  ?  ?Future Appointments   ? ?        ? In 1 month Ladell Pier, MD [place]  ? ?  ? ?  ?  ?  ?  ?

## 2021-12-29 NOTE — Telephone Encounter (Signed)
OK I sent in consult ?

## 2021-12-29 NOTE — Telephone Encounter (Signed)
Requested medications are due for refill today.  Unsure ? ?Requested medications are on the active medications list.  yes ? ?Last refill. 10/08/2021 ? ?Future visit scheduled.   yes ? ?Notes to clinic.  Medication requires manual review. ? ? ? ?Requested Prescriptions  ?Pending Prescriptions Disp Refills  ? neomycin-polymyxin-hydrocortisone (CORTISPORIN) OTIC solution 10 mL 0  ?  Sig: Place 4 drops into both ears 4 (four) times daily.  ?  ? Off-Protocol Failed - 12/26/2021  4:49 PM  ?  ?  Failed - Medication not assigned to a protocol, review manually.  ?  ?  Passed - Valid encounter within last 12 months  ?  Recent Outpatient Visits   ? ?      ? 4 months ago Essential hypertension  ? [place], [place], [place]  ? 11 months ago Essential hypertension  ? [place] Ladell Pier, MD  ? 1 year ago Essential hypertension  ? [place] Elsie Stain, MD  ? 1 year ago Essential hypertension  ? [place], NP  ? 1 year ago Essential hypertension  ? [place], [place], NP  ? ?  ?  ?Future Appointments   ? ?        ? In 1 month Ladell Pier, MD [place]  ? ?  ? ?  ?  ?  ?Signed Prescriptions Disp Refills  ? losartan (COZAAR) 50 MG tablet 90 tablet 0  ?  Sig: TAKE 1 TABLET (50 MG TOTAL) BY MOUTH DAILY.  ?  ? Cardiovascular:  Angiotensin Receptor Blockers Failed - 12/26/2021  4:49 PM  ?  ?  Failed - Last BP in normal range  ?  BP Readings from Last 1 Encounters:  ?10/08/21 (!) 137/96  ?  ?  ?  ?  Passed - Cr in normal range and within 180 days  ?  Creatinine  ?Date Value Ref Range Status  ?09/16/2021 0.82 0.61 - 1.24 mg/dL Final  ? ?Creat  ?Date Value Ref Range Status  ?07/09/2016 0.79 0.70 - 1.33 mg/dL Final  ?  Comment:  ?    ?For patients > or = 61 years of age: The upper reference limit  for ?Creatinine is approximately 13% higher for people identified as ?African-American. ?  ?  ?  ?  ?  ?  Passed - K in normal range and within 180 days  ?  Potassium  ?Date Value Ref Range Status  ?09/16/2021 3.9 3.5 - 5.1 mmol/L Final  ?  ?  ?  ?  Passed - Patient is not pregnant  ?  ?  Passed - Valid encounter within last 6 months  ?  Recent Outpatient Visits   ? ?      ? 4 months ago Essential hypertension  ? [place], [place], [place]  ? 11 months ago Essential hypertension  ? [place] Ladell Pier, MD  ? 1 year ago Essential hypertension  ? [place] Elsie Stain, MD  ? 1 year ago Essential hypertension  ? [place], NP  ? 1 year ago Essential hypertension  ? [place], [place], NP  ? ?  ?  ?  Future Appointments   ? ?        ? In 1 month Ladell Pier, MD [place]  ? ?  ? ?  ?  ?  ? atorvastatin (LIPITOR) 10 MG tablet 90 tablet 2  ?  Sig: TAKE 1 TABLET (10 MG TOTAL) BY MOUTH DAILY.  ?  ? Cardiovascular:  Antilipid - Statins Failed - 12/26/2021  4:49 PM  ?  ?  Failed - Lipid Panel in normal range within the last 12 months  ?  Cholesterol, Total  ?Date Value Ref Range Status  ?08/13/2021 157 100 - 199 mg/dL Final  ? ?LDL Chol Calc (NIH)  ?Date Value Ref Range Status  ?08/13/2021 88 0 - 99 mg/dL Final  ? ?HDL  ?Date Value Ref Range Status  ?08/13/2021 44 >39 mg/dL Final  ? ?Triglycerides  ?Date Value Ref Range Status  ?08/13/2021 139 0 - 149 mg/dL Final  ? ?  ?  ?  Passed - Patient is not pregnant  ?  ?  Passed - Valid encounter within last 12 months  ?  Recent Outpatient Visits   ? ?      ? 4 months ago Essential hypertension  ? [place], [place], [place]  ? 11 months ago Essential hypertension  ? [place]  Ladell Pier, MD  ? 1 year ago Essential hypertension  ? [place] Elsie Stain, MD  ? 1 year ago Essential hypertension  ? [place], NP  ? 1 year ago Essential hypertension  ? [place], [place], NP  ? ?  ?  ?Future Appointments   ? ?        ? In 1 month Ladell Pier, MD [place]  ? ?  ? ?  ?  ?  ?  ?

## 2021-12-29 NOTE — Telephone Encounter (Signed)
Requested Prescriptions  ?Pending Prescriptions Disp Refills  ?? neomycin-polymyxin-hydrocortisone (CORTISPORIN) OTIC solution 10 mL 0  ?  Sig: Place 4 drops into both ears 4 (four) times daily.  ?  ? Off-Protocol Failed - 12/26/2021  4:49 PM  ?  ?  Failed - Medication not assigned to a protocol, review manually.  ?  ?  Passed - Valid encounter within last 12 months  ?  Recent Outpatient Visits   ?      ? 4 months ago Essential hypertension  ? [place], [place], [place]  ? 11 months ago Essential hypertension  ? [place] Ladell Pier, MD  ? 1 year ago Essential hypertension  ? [place] Elsie Stain, MD  ? 1 year ago Essential hypertension  ? [place], NP  ? 1 year ago Essential hypertension  ? [place], [place], NP  ?  ?  ?Future Appointments   ?        ? In 1 month Ladell Pier, MD [place]  ?  ? ?  ?  ?  ?? losartan (COZAAR) 50 MG tablet 90 tablet 0  ?  Sig: TAKE 1 TABLET (50 MG TOTAL) BY MOUTH DAILY.  ?  ? Cardiovascular:  Angiotensin Receptor Blockers Failed - 12/26/2021  4:49 PM  ?  ?  Failed - Last BP in normal range  ?  BP Readings from Last 1 Encounters:  ?10/08/21 (!) 137/96  ?   ?  ?  Passed - Cr in normal range and within 180 days  ?  Creatinine  ?Date Value Ref Range Status  ?09/16/2021 0.82 0.61 - 1.24 mg/dL Final  ? ?Creat  ?Date Value Ref Range Status  ?07/09/2016 0.79 0.70 - 1.33 mg/dL Final  ?  Comment:  ?    ?For patients > or = 61 years of age: The upper reference limit for ?Creatinine is approximately 13% higher for people identified as ?African-American. ?  ?  ?   ?  ?  Passed - K in normal range and within 180 days  ?  Potassium  ?Date Value Ref Range Status  ?09/16/2021 3.9 3.5 - 5.1 mmol/L Final  ?   ?  ?  Passed - Patient is not pregnant  ?  ?  Passed -  Valid encounter within last 6 months  ?  Recent Outpatient Visits   ?      ? 4 months ago Essential hypertension  ? [place], [place], [place]  ? 11 months ago Essential hypertension  ? [place] Ladell Pier, MD  ? 1 year ago Essential hypertension  ? [place] Elsie Stain, MD  ? 1 year ago Essential hypertension  ? [place], NP  ? 1 year ago Essential hypertension  ? [place], [place], NP  ?  ?  ?Future Appointments   ?        ? In 1 month Ladell Pier, MD [place]  ?  ? ?  ?  ?  ?? atorvastatin (LIPITOR) 10 MG tablet 90 tablet 0  ?  Sig: TAKE 1 TABLET (10 MG  TOTAL) BY MOUTH DAILY.  ?  ? Cardiovascular:  Antilipid - Statins Failed - 12/26/2021  4:49 PM  ?  ?  Failed - Lipid Panel in normal range within the last 12 months  ?  Cholesterol, Total  ?Date Value Ref Range Status  ?08/13/2021 157 100 - 199 mg/dL Final  ? ?LDL Chol Calc (NIH)  ?Date Value Ref Range Status  ?08/13/2021 88 0 - 99 mg/dL Final  ? ?HDL  ?Date Value Ref Range Status  ?08/13/2021 44 >39 mg/dL Final  ? ?Triglycerides  ?Date Value Ref Range Status  ?08/13/2021 139 0 - 149 mg/dL Final  ? ?  ?  ?  Passed - Patient is not pregnant  ?  ?  Passed - Valid encounter within last 12 months  ?  Recent Outpatient Visits   ?      ? 4 months ago Essential hypertension  ? [place], [place], [place]  ? 11 months ago Essential hypertension  ? [place] Ladell Pier, MD  ? 1 year ago Essential hypertension  ? [place] Elsie Stain, MD  ? 1 year ago Essential hypertension  ? [place], NP  ? 1 year ago Essential hypertension  ? [place], [place], NP  ?  ?  ?Future Appointments   ?        ? In 1 month Ladell Pier, MD [place]  ?  ? ?  ?  ?  ? ?

## 2021-12-29 NOTE — Telephone Encounter (Signed)
Called and given below message. He verbalized understanding and he now has insurance that started on 12/27/21. ?

## 2021-12-29 NOTE — Telephone Encounter (Signed)
-----   Message from Heath Lark, MD sent at 12/29/2021 11:29 AM EDT ----- ?He told me to wait until April to call once he has insurance for [place] to refer him to see CT surgeon to address his enlarged aorta. Can you call and ask if he has insurance? ? ?

## 2021-12-29 NOTE — Telephone Encounter (Signed)
Pt has an appt on 5/16 

## 2021-12-29 NOTE — Telephone Encounter (Signed)
Patient had a telemedicine visit 12/26/2021 and was already prescribed antibiotics and eardrops.  Current messages from 3 days ago. ?

## 2021-12-30 ENCOUNTER — Other Ambulatory Visit: Payer: Self-pay

## 2021-12-30 MED ORDER — NEOMYCIN-POLYMYXIN-HC 3.5-10000-1 OT SOLN
4.0000 [drp] | Freq: Four times a day (QID) | OTIC | 0 refills | Status: DC
  Filled 2021-12-30: qty 10, 13d supply, fill #0

## 2022-01-06 ENCOUNTER — Other Ambulatory Visit: Payer: Self-pay

## 2022-01-15 ENCOUNTER — Institutional Professional Consult (permissible substitution) (INDEPENDENT_AMBULATORY_CARE_PROVIDER_SITE_OTHER): Payer: BC Managed Care – PPO | Admitting: Physician Assistant

## 2022-01-15 DIAGNOSIS — I7121 Aneurysm of the ascending aorta, without rupture: Secondary | ICD-10-CM

## 2022-01-15 NOTE — Patient Instructions (Signed)
Patient is counseled regarding the importance of long term risk factor modification as they pertain to the presence of ischemic heart disease including avoiding the use of all tobacco products, dietary modifications and medical therapy for diabetes, cholesterol and lipid management, and regular exercise.   ? ?Make every effort to maintain a "heart-healthy" lifestyle with regular physical exercise and adherence to a low-fat, low-carbohydrate diet.  Continue to seek regular follow-up appointments with your primary care physician and/or cardiologist. ? ?Stop smoking immediately and permanently. ? ? ?AVOID FLOUROQUINOLONES AS THEY INCREASE RISK OF DISSECTION ?

## 2022-01-15 NOTE — Progress Notes (Signed)
? ?   ?[address] ?      [place] [address] ?            [phone]   ? ?  ? ? ?[name] ?[id number] ?[birth year] ? ?History of Present Illness: ? ?Darren [name] is a 61 yo male with history of HTN, Nicotine use, Hypothyroidism, and Chronic Lymphocytic Leukemia, and Ascending Aortic Aneurysm.  The aneurysm was initially diagnosed several years ago.  CT scan from 2017 showed the aneurysm to measure 4.2 cm.  The patient denies chest pain.  He does have some shortness of breath.  He also states that when he is at work he gets easily worn out.  His heart rate elevates and he has difficulty getting all the work done.  He also notes he feels like it is difficult to bend over.  Its hot a work as well.  He states this mainly causes him anxiety.  He is a former smoker, but quit back in 2005.  He does have underlying COPD and does not see a pulmonologist.  He also has some knee pain.   ? ?Current Outpatient Medications on File Prior to Visit  ?Medication Sig Dispense Refill  ? amLODipine (NORVASC) 5 MG tablet TAKE 1 TABLET (5 MG TOTAL) BY MOUTH DAILY. 90 tablet 2  ? amoxicillin (AMOXIL) 875 MG tablet Take 1 tablet (875 mg total) by mouth 2 (two) times daily. 1 po BID 20 tablet 0  ? atorvastatin (LIPITOR) 10 MG tablet TAKE 1 TABLET (10 MG TOTAL) BY MOUTH DAILY. 90 tablet 2  ? cetirizine (ZYRTEC) 10 MG tablet Take 1 tablet (10 mg total) by mouth daily. 30 tablet 11  ? diclofenac Sodium (VOLTAREN) 1 % GEL APPLY 4 G TOPICALLY 4 (FOUR) TIMES DAILY. 100 g 5  ? levothyroxine (SYNTHROID) 175 MCG tablet TAKE 1 TABLET (175 MCG TOTAL) BY MOUTH DAILY. 90 tablet 1  ? losartan (COZAAR) 50 MG tablet TAKE 1 TABLET (50 MG TOTAL) BY MOUTH DAILY. 90 tablet 0  ? methocarbamol (ROBAXIN) 500 MG tablet Take 1 tablet (500 mg total) by mouth every 8 (eight) hours as needed for muscle spasms. 40 tablet 0  ? neomycin-polymyxin-hydrocortisone (CORTISPORIN) OTIC solution Place 4 drops into both ears 4 (four) times daily. 10 mL 0   ? ofloxacin (FLOXIN OTIC) 0.3 % OTIC solution Place 5 drops into both ears daily. 10 mL 0  ? ondansetron (ZOFRAN) 8 MG tablet Take 1 tablet (8 mg total) by mouth every 8 (eight) hours as needed for nausea or vomiting. (Patient not taking: Reported on 10/08/2021) 20 tablet 0  ? pantoprazole (PROTONIX) 40 MG tablet TAKE 1 TABLET (40 MG TOTAL) BY MOUTH DAILY. 90 tablet 1  ? ?No current facility-administered medications on file prior to visit.  ? ? ? ?BP (!) 144/82 (BP Location: Right Arm, Patient Position: Sitting, Cuff Size: Normal)   Pulse 72   Resp 20   Ht '5\' 4"'$  (1.626 m)   Wt 182 lb (82.6 kg)   SpO2 95% Comment: RA  BMI 31.24 kg/m?  ? ?Physical Exam ? ?Gen: NAD  ?Heart:RRR ?Lungs:CTA bilaterally ?HTD:SKAJ non-tender, non-distended  ?Ext:no edema ?Neuro: grossly normal ? ?CTA Results: ? ?FINDINGS: ?CT CHEST FINDINGS ?  ?Cardiovascular: Normal heart size. No pericardial effusion. No ?significant coronary artery calcifications. Mild atherosclerotic ?disease of the thoracic aorta. Mild dilation of the ascending ?thoracic aorta, measuring up to 4.2 cm, unchanged compared to prior ?exam. ?  ?Mediastinum/Nodes: No pathologically enlarged lymph nodes  seen in ?the chest. ?  ?Lungs/Pleura: Central airways are patent. Centrilobular emphysema. ?No consolidation, pleural effusion or pneumothorax. No suspicious ?pulmonary nodules. ?  ?Musculoskeletal: No chest wall mass or suspicious bone lesions ?identified. ?  ?CT ABDOMEN PELVIS FINDINGS ?  ?Hepatobiliary: No focal liver abnormality is seen. No gallstones, ?gallbladder wall thickening, or biliary dilatation. ?  ?Pancreas: Unremarkable. No pancreatic ductal dilatation or ?surrounding inflammatory changes. ?  ?Spleen: Normal in size without focal abnormality. ?  ?Adrenals/Urinary Tract: Adrenal glands are unremarkable. Kidneys are ?normal, without renal calculi, focal lesion, or hydronephrosis. ?Bladder is unremarkable. ?  ?Stomach/Bowel: Stomach is within normal limits.  Appendix appears ?normal. No evidence of bowel wall thickening, distention, or ?inflammatory changes. ?  ?Vascular/Lymphatic: Mild aortic atherosclerosis. Prominent bilateral ?external iliac lymph nodes measuring up to 1.0 cm in short axis. ?Otherwise, no pathologically enlarged lymph nodes seen in the ?abdomen or pelvis. ?  ?Reproductive: Prostate is unremarkable. ?  ?Other: No abdominal wall hernia or abnormality. No abdominopelvic ?ascites. ?  ?Musculoskeletal: No acute or significant osseous findings. ?  ?IMPRESSION: ?1. Prominent bilateral external iliac lymph nodes measuring up to ?1.0 cm in short axis. Otherwise, no enlarged lymph nodes seen in the ?chest, abdomen or pelvis. ?2. Unchanged mild dilation of the ascending thoracic aorta, ?measuring up to 4.2 cm. Recommend annual imaging followup by CTA or ?MRA. This recommendation follows 2010 ?ACCF/AHA/AATS/ACR/ASA/SCA/SCAI/SIR/STS/SVM Guidelines for the ?Diagnosis and Management of Patients with Thoracic Aortic Disease. ?Circulation. 2010; 121: M094-B096. Aortic aneurysm NOS (ICD10-I71.9) ?3. Mild aortic Atherosclerosis (ICD10-I70.0). ?  ?  ?Electronically Signed ?  By: Yetta Glassman M.D. ?  On: 09/17/2021 13:03 ? ? ?A/P: ? ?Ascending Aortic Aneurysm- remains stable at 4.2 cm from scan in 2017 ?COPD- former smoker, quit in 2005, I suspect this can be cause of dyspnea will make referral to pulmonary for optimization of lung function ?HTN ?Hypothyroidism ?CLL- per hematology ?6.   Dispo- patient has Ascending Aortic Aneurysm which measures 4.2 cm, this is stable from 2017.. he will need to have repeat CT scan in December with follow up in our office... Education provided on aneurysm, referral to pulmonary for COPD ? ?Risk Modification: ? ?Statin:  Yes ? ?Smoking cessation instruction/counseling given:   ? ?Quit smoking in 2005 ? ?Patient was counseled on importance of Blood Pressure Control.  Despite Medical intervention if the patient notices persistently  elevated blood pressure readings.  They are instructed to contact their Primary Care Physician ? ?Please avoid use of Fluoroquinolones as this can potentially increase your risk of Aortic Rupture and/or Dissection ? ?Patient educated on signs and symptoms of Aortic Dissection, handout also provided in AVS ? ?Ellwood Handler, PA-C ?01/15/22 ? ? ? ? ?

## 2022-02-03 ENCOUNTER — Other Ambulatory Visit: Payer: Self-pay

## 2022-02-06 ENCOUNTER — Encounter: Payer: Self-pay | Admitting: Pulmonary Disease

## 2022-02-06 ENCOUNTER — Ambulatory Visit (INDEPENDENT_AMBULATORY_CARE_PROVIDER_SITE_OTHER): Payer: BC Managed Care – PPO | Admitting: Pulmonary Disease

## 2022-02-06 VITALS — BP 138/74 | HR 73 | Temp 97.7°F | Ht 65.0 in | Wt 184.4 lb

## 2022-02-06 DIAGNOSIS — J432 Centrilobular emphysema: Secondary | ICD-10-CM | POA: Diagnosis not present

## 2022-02-06 NOTE — Progress Notes (Signed)
? ?[facility] ? ?Chief Complaint  ?Patient presents with  ? Consult  ?  SOB at times   ? ? ?Past Surgical History:  ?He  has a past surgical history that includes Hemorrhoid surgery; Multiple extractions with alveoloplasty (11/30/2011); Esophagogastroduodenoscopy (2001); Esophagogastroduodenoscopy (egd) with propofol (N/A, 09/18/2016); and biopsy (09/18/2016). ? ?Past Medical History:  ?Arthritis, Ascending thoracic aorta, Barrett's esophagus, GERD, HTN, Hypothyroidism, COVID 19 in 2021 ? ?Constitutional:  ?BP 138/74 (BP Location: Left Arm, Patient Position: Sitting, Cuff Size: Normal)   Pulse 73   Temp 97.7 ?F (36.5 ?C) (Oral)   Ht '5\' 5"'$  (1.651 m)   Wt 184 lb 6.4 oz (83.6 kg)   SpO2 96%   BMI 30.69 kg/m?  ? ?Brief Summary:  ?Darren [name] is a 61 y.o. male former smoker with dyspnea.  ?  ? ? ? ?Subjective:  ? ?He was found to have ascending aortic aneurysm.  Saw cardiothoracic surgery.  Noted to have complaints of dyspnea.  Referred for further assessment. ? ?He quit smoking several years ago.  He was tried on albuterol before and this helped some.  He doesn't having cough, wheeze, sputum, or hemoptysis.  He feels most of his breathing trouble happens when he bends over.  He hurt his back recently and feels this has limited his breathing due to pain. ? ?He is from [place].  He works in a hot environment, and this makes it harder for him to breath.  No history of pneumonia or tuberculosis.   ? ?Physical Exam:  ? ?Appearance - well kempt  ? ?ENMT - no sinus tenderness, no oral exudate, no LAN, Mallampati 3 airway, no stridor, poor dentition, wears dentures ? ?Respiratory - equal breath sounds bilaterally, no wheezing or rales ? ?CV - s1s2 regular rate and rhythm, no murmurs ? ?Ext - no clubbing, no edema ? ?Skin - no rashes ? ?Psych - normal mood and affect ?  ?Pulmonary testing:  ? ? ?Chest Imaging:  ?CT chest 09/17/21 >> atherosclerosis, ascending aorta 4.2 cm,  centrilobular emphysema ? ?Sleep Tests:  ? ? ?Cardiac Tests:  ?Echo 09/30/16 >> EF 55 to 60% ? ?Social History:  ?He  reports that he quit smoking about 17 years ago. His smoking use included cigarettes. He has a 26.00 pack-year smoking history. He has never used smokeless tobacco. He reports current alcohol use. He reports that he does not use drugs. ? ?Family History:  ?His family history includes Cancer in his father, maternal aunt, and mother; Heart attack (age of onset: 62) in his father; Lung cancer in his mother; Stroke in his father; Thyroid disease in his sister. ?  ? ? ?Assessment/Plan:  ? ?Dyspnea. ?- likely combination of emphysema, obesity, and deconditioning ?- will arrange for pulmonary function test to further assess ?- defer additional therapeutics for now ? ?Ascending thoracic aorta. ?- followed by cardiothoracic surgery ? ?Chronic lymphocytic leukemia. ?- followed by Dr. Heath Lark with hematology ? ?Time Spent Involved in Patient Care on Day of Examination:  ?35 minutes ? ?Follow up:  ? ?Patient Instructions  ?Will arrange for pulmonary function test and follow up in 4 to 6 weeks ? ?Medication List:  ? ?Allergies as of 02/06/2022   ?No Known Allergies ?  ? ?  ?Medication List  ?  ? ?  ? Accurate as of Feb 06, 2022  3:09 PM. If you have any questions, ask your nurse or doctor.  ?  ?  ? ?  ? ?  amLODipine 5 MG tablet ?Commonly known as: NORVASC ?TAKE 1 TABLET (5 MG TOTAL) BY MOUTH DAILY. ?  ?amoxicillin 875 MG tablet ?Commonly known as: AMOXIL ?Take 1 tablet (875 mg total) by mouth 2 (two) times daily. 1 po BID ?  ?atorvastatin 10 MG tablet ?Commonly known as: LIPITOR ?TAKE 1 TABLET (10 MG TOTAL) BY MOUTH DAILY. ?  ?cetirizine 10 MG tablet ?Commonly known as: ZYRTEC ?Take 1 tablet (10 mg total) by mouth daily. ?  ?diclofenac Sodium 1 % Gel ?Commonly known as: VOLTAREN ?APPLY 4 G TOPICALLY 4 (FOUR) TIMES DAILY. ?  ?levothyroxine 175 MCG tablet ?Commonly known as: SYNTHROID ?TAKE 1 TABLET (175 MCG TOTAL)  BY MOUTH DAILY. ?  ?losartan 50 MG tablet ?Commonly known as: COZAAR ?TAKE 1 TABLET (50 MG TOTAL) BY MOUTH DAILY. ?  ?methocarbamol 500 MG tablet ?Commonly known as: Robaxin ?Take 1 tablet (500 mg total) by mouth every 8 (eight) hours as needed for muscle spasms. ?  ?neomycin-polymyxin-hydrocortisone OTIC solution ?Commonly known as: CORTISPORIN ?Place 4 drops into both ears 4 (four) times daily. ?  ?ofloxacin 0.3 % OTIC solution ?Commonly known as: Floxin Otic ?Place 5 drops into both ears daily. ?  ?ondansetron 8 MG tablet ?Commonly known as: Zofran ?Take 1 tablet (8 mg total) by mouth every 8 (eight) hours as needed for nausea or vomiting. ?  ?pantoprazole 40 MG tablet ?Commonly known as: PROTONIX ?TAKE 1 TABLET (40 MG TOTAL) BY MOUTH DAILY. ?  ? ?  ? ? ?Signature:  ?Chesley Mires, MD ?[facility] ?Pager - [phone] ?02/06/2022, 3:09 PM ?  ? ? ? ? ? ? ? ? ?

## 2022-02-06 NOTE — Patient Instructions (Signed)
Will arrange for pulmonary function test and follow up in 4 to 6 weeks ?

## 2022-02-10 ENCOUNTER — Encounter: Payer: Self-pay | Admitting: Internal Medicine

## 2022-02-10 ENCOUNTER — Ambulatory Visit: Payer: BC Managed Care – PPO | Attending: Internal Medicine | Admitting: Internal Medicine

## 2022-02-10 ENCOUNTER — Other Ambulatory Visit: Payer: Self-pay

## 2022-02-10 VITALS — BP 126/80 | HR 91 | Temp 98.9°F | Resp 18 | Ht 64.0 in | Wt 183.0 lb

## 2022-02-10 DIAGNOSIS — E039 Hypothyroidism, unspecified: Secondary | ICD-10-CM

## 2022-02-10 DIAGNOSIS — Z125 Encounter for screening for malignant neoplasm of prostate: Secondary | ICD-10-CM | POA: Diagnosis not present

## 2022-02-10 DIAGNOSIS — I7121 Aneurysm of the ascending aorta, without rupture: Secondary | ICD-10-CM

## 2022-02-10 DIAGNOSIS — I10 Essential (primary) hypertension: Secondary | ICD-10-CM

## 2022-02-10 DIAGNOSIS — K219 Gastro-esophageal reflux disease without esophagitis: Secondary | ICD-10-CM

## 2022-02-10 DIAGNOSIS — R3589 Other polyuria: Secondary | ICD-10-CM

## 2022-02-10 DIAGNOSIS — C9110 Chronic lymphocytic leukemia of B-cell type not having achieved remission: Secondary | ICD-10-CM

## 2022-02-10 DIAGNOSIS — M545 Low back pain, unspecified: Secondary | ICD-10-CM | POA: Diagnosis not present

## 2022-02-10 MED ORDER — TAMSULOSIN HCL 0.4 MG PO CAPS
0.4000 mg | ORAL_CAPSULE | Freq: Every day | ORAL | 3 refills | Status: AC
  Filled 2022-02-10: qty 30, 30d supply, fill #0
  Filled 2022-03-23: qty 30, 30d supply, fill #1

## 2022-02-10 MED ORDER — LEVOTHYROXINE SODIUM 175 MCG PO TABS
ORAL_TABLET | ORAL | 1 refills | Status: DC
  Filled 2022-02-10: qty 90, fill #0
  Filled 2022-03-02: qty 90, 90d supply, fill #0
  Filled 2022-06-15: qty 30, 30d supply, fill #1
  Filled 2022-07-10: qty 30, 30d supply, fill #2
  Filled 2022-08-07: qty 30, 30d supply, fill #3

## 2022-02-10 MED ORDER — PANTOPRAZOLE SODIUM 40 MG PO TBEC
DELAYED_RELEASE_TABLET | Freq: Every day | ORAL | 1 refills | Status: DC
  Filled 2022-02-10: qty 90, fill #0
  Filled 2022-03-23: qty 90, 90d supply, fill #0
  Filled 2022-06-25: qty 90, 90d supply, fill #1

## 2022-02-10 MED ORDER — AMLODIPINE BESYLATE 5 MG PO TABS
ORAL_TABLET | Freq: Every day | ORAL | 2 refills | Status: DC
  Filled 2022-02-10: qty 90, 90d supply, fill #0
  Filled 2022-05-18: qty 90, 90d supply, fill #1
  Filled 2022-08-17: qty 90, 90d supply, fill #2

## 2022-02-10 MED ORDER — METHOCARBAMOL 500 MG PO TABS
500.0000 mg | ORAL_TABLET | Freq: Every evening | ORAL | 3 refills | Status: DC | PRN
  Filled 2022-02-10: qty 30, 30d supply, fill #0

## 2022-02-10 NOTE — Progress Notes (Signed)
Patient took medication around 11:30 am. ?Patient ate this morning. 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 ?Patient reports urination frequency presenting 3 months ago. ?

## 2022-02-10 NOTE — Progress Notes (Signed)
? ? ?Patient ID: Darren [name], male    DOB: 07-05-1961  MRN: [id number] ? ?CC: Chronic disease management. ? ?Subjective: ?Darren [name] is a 61 y.o. male who presents for chronic disease management. ?His concerns today include:  ?Pt with hx of GERD, hypothyroid, HTN, HL, centrilobular emphysema, CLL followed by Dr. Lottie Rater, ascending aortic aneurysm (followed by vascular), chronic LBP and Barrett's esophagus.   Hx of COVID infection ? ?Pt c/o lower back pain LT side that was made worse after recurrent bending and lifting/holding buckets with outstretch arms in front of him at work on May 5..   Pain does not radiate.  No numbness or tingling.  Voltaren Gel helps. ?Works at [employer].  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  Patient reports that on May 5, the conveyor belt at work breaking down and throwing items under the machine.  So he had to do a lot more bending to blow the parts from under the machine. ?Request no lifting restriction of no more than 25 lbs and avoid frequent bending (usually has to bend about 3-4x/day) for 6 wks.   ?-Pain has decreased some but his supervisor was trying to put him back on same job.  He was told that he would need a letter from his physician requesting restrictions. ? ?HTN:  taking Norvasc 5 mg and Cozaar 50 mg ?Taken both already today. ?No device at home to check ?Taking and tolerating Lipitor 10 mg daily ?Also reports compliance with taking atorvastatin for his cholesterol. ? ?Requesting RF on Protonix for heart burn.  Gets symptoms if he does not take it.  He has history of Barrett's esophagus. ? ?Hypothyroid:  taking Levothyroxine 175 mcg.  Denies any major weight changes.  No palpitations. ? ?Reports frequent urination with strain to get flow started x 3-4 months.  Feels like he incompletely empties.  He has stop and go urine flow.  Wakes up several times at nights to urinate. ? ?CLL: Since last visit with me, he was referred to oncologist Dr. Lottie Rater for  persistent elevation of WBC.  Found to have CLL stage 0.  Plan is for observation with follow-up in 1 year.  He was last seen 08/2021. ? ?Ascending Aortic Aneurysm: Seen by vascular surgeon PA 12/2021 for follow-up on this.  This is stable in diameter with last measurement being 4.2 cm on CT done 08/2021.Marland Kitchen  Plan for yearly evaluation ?He was referred to the pulmonologist Dr. Halford Chessman for dyspnea.  Assessed to be multifactorial including obesity, emphysema and deconditioning.  PFTs were ordered but have not been done as yet. ? ? ?Patient Active Problem List  ? Diagnosis Date Noted  ? Infective otitis externa of both ears 10/08/2021  ? Seasonal allergies 10/08/2021  ? Chronic lymphocytic leukemia ([name]) 09/05/2021  ? Ascending aortic aneurysm ([place]) 01/16/2021  ? Hyperlipidemia 12/09/2020  ? Lipoma of neck 11/24/2019  ? Class 1 obesity due to excess calories without serious comorbidity with body mass index (BMI) of 31.0 to 31.9 in adult 11/04/2017  ? 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 Spondylosis without myelopathy or radiculopathy, lumbar region 12/21/2016  ? Lumbar degenerative disc disease 12/21/2016  ? COPD (chronic obstructive pulmonary disease) with emphysema ([place]) 07/31/2016  ? HTN (hypertension) 07/31/2016  ? Dilation of thoracic aorta ([place]) 07/31/2016  ? Esophageal dysphagia 07/09/2016  ? Chronic midline low back pain 07/09/2016  ? Sensorineural hearing loss (SNHL), bilateral 03/30/2016  ? Vitamin D deficiency 04/06/2014  ? Hypothyroidism 10/31/2013  ?  GERD (gastroesophageal reflux disease) 11/25/2011  ? Barrett esophagus 09/29/1999  ?  ? ?Current Outpatient Medications on File Prior to Visit  ?Medication Sig Dispense Refill  ? atorvastatin (LIPITOR) 10 MG tablet TAKE 1 TABLET (10 MG TOTAL) BY MOUTH DAILY. 90 tablet 2  ? cetirizine (ZYRTEC) 10 MG tablet Take 1 tablet (10 mg total) by mouth daily. 30 tablet 11  ? diclofenac Sodium (VOLTAREN) 1 % GEL APPLY 4 G TOPICALLY 4 (FOUR) TIMES DAILY. 100 g 5  ?  losartan (COZAAR) 50 MG tablet TAKE 1 TABLET (50 MG TOTAL) BY MOUTH DAILY. 90 tablet 0  ? neomycin-polymyxin-hydrocortisone (CORTISPORIN) OTIC solution Place 4 drops into both ears 4 (four) times daily. 10 mL 0  ? ofloxacin (FLOXIN OTIC) 0.3 % OTIC solution Place 5 drops into both ears daily. 10 mL 0  ? ?No current facility-administered medications on file prior to visit.  ? ? ?No Known Allergies ? ?Social History  ? ?Socioeconomic History  ? Marital status: Divorced  ?  Spouse name: Not on file  ? Number of children: 2  ? Years of education: Not on file  ? Highest education level: Not on file  ?Occupational History  ? Occupation: Associate Professor  ?Tobacco Use  ? Smoking status: Former  ?  Packs/day: 1.00  ?  Years: 26.00  ?  Pack years: 26.00  ?  Types: Cigarettes  ?  Quit date: 09/15/2004  ?  Years since quitting: 17.4  ? Smokeless tobacco: Never  ? Tobacco comments:  ?  Quit in 2005  ?Substance and Sexual Activity  ? Alcohol use: Yes  ?  Comment: ON WEEKENDS  ? Drug use: No  ? Sexual activity: Not Currently  ?  Birth control/protection: None  ?Other Topics Concern  ? Not on file  ?Social History Narrative  ? Lives alone.    ? ?Social Determinants of Health  ? ?Financial Resource Strain: Not on file  ?Food Insecurity: Not on file  ?Transportation Needs: Not on file  ?Physical Activity: Not on file  ?Stress: Not on file  ?Social Connections: Not on file  ?Intimate Partner Violence: Not on file  ? ? ?Family History  ?Problem Relation Age of Onset  ? Cancer Mother   ?     lung cancer  ? Lung cancer Mother   ? Cancer Father   ?     lung  ? Stroke Father   ? Heart attack Father 34  ? Thyroid disease Sister   ? Cancer Maternal Aunt   ?     stomach  ? Colon cancer Neg Hx   ? Esophageal cancer Neg Hx   ? ? ?Past Surgical History:  ?Procedure Laterality Date  ? BIOPSY  09/18/2016  ? Procedure: BIOPSY;  Surgeon: Daneil Dolin, MD;  [facility];  Service: Endoscopy;;  biopsy of distal esophagus  ?  ESOPHAGOGASTRODUODENOSCOPY  2001  ? barrett's per path  ? ESOPHAGOGASTRODUODENOSCOPY (EGD) WITH PROPOFOL N/A 09/18/2016  ? Procedure: ESOPHAGOGASTRODUODENOSCOPY (EGD) WITH PROPOFOL;  Surgeon: Daneil Dolin, MD;  [facility];  Service: Endoscopy;  Laterality: N/A;  1200  ? HEMORRHOID SURGERY    ? 1982  ? MULTIPLE EXTRACTIONS WITH ALVEOLOPLASTY  11/30/2011  ? Procedure: MULTIPLE EXTRACION WITH ALVEOLOPLASTY;  Surgeon: Gae Bon, DDS;  Location: [facility];  Service: Oral Surgery;  Laterality: Bilateral;  ? ? ?ROS: ?Review of Systems ?Negative except as stated above ? ?PHYSICAL EXAM: ?BP 126/80   Pulse 91  Temp 98.9 ?F (37.2 ?C) (Oral)   Resp 18   Ht '5\' 4"'$  (1.626 m)   Wt 183 lb (83 kg)   SpO2 96%   BMI 31.41 kg/m?   ?Physical Exam ? ? ?General appearance - alert, well appearing, and in no distress ?Mental status - normal mood, behavior, speech, dress, motor activity, and thought processes ?Neck - supple, no significant adenopathy ?Chest - clear to auscultation, no wheezes, rales or rhonchi, symmetric air entry ?Heart - normal rate, regular rhythm, normal S1, S2, no murmurs, rubs, clicks or gallops ?Musculoskeletal -he has mild tenderness on palpation of the left lumbar paraspinal muscles.  Mild tenderness on palpation of the lumbar spine.  Power in the lower extremities 5/5 bilaterally proximally and distally.  Gait is stable. ?Extremities - peripheral pulses normal, no pedal edema, no clubbing or cyanosis ? ? ?  Latest Ref Rng & Units 09/16/2021  ?  3:51 PM 08/13/2021  ?  9:33 AM 01/16/2021  ?  3:13 PM  ?CMP  ?Glucose 70 - 99 mg/dL 94   151     ?BUN 6 - 20 mg/dL 15   15     ?Creatinine 0.61 - 1.24 mg/dL 0.82   0.83     ?Sodium 135 - 145 mmol/L 137   137     ?Potassium 3.5 - 5.1 mmol/L 3.9   4.5     ?Chloride 98 - 111 mmol/L 102   100     ?CO2 22 - 32 mmol/L 28   23     ?Calcium 8.9 - 10.3 mg/dL 9.6   9.3     ?Total Protein 6.5 - 8.1 g/dL 7.1   6.9   6.6    ?Total Bilirubin 0.3 - 1.2 mg/dL 0.5   0.4    0.3    ?Alkaline Phos 38 - 126 U/L 77   99   90    ?AST 15 - 41 U/L '19   25   20    '$ ?ALT 0 - 44 U/L 29   38   29    ? ?Lipid Panel  ?   ?Component Value Date/Time  ? CHOL 157 08/13/2021 0933  ? TRIG 139 08/13/2021 0933  ? HDL 44

## 2022-02-11 ENCOUNTER — Telehealth: Payer: Self-pay | Admitting: Internal Medicine

## 2022-02-11 ENCOUNTER — Encounter: Payer: Self-pay | Admitting: Internal Medicine

## 2022-02-11 DIAGNOSIS — R7303 Prediabetes: Secondary | ICD-10-CM | POA: Insufficient documentation

## 2022-02-11 LAB — URINALYSIS, ROUTINE W REFLEX MICROSCOPIC
Bilirubin, UA: NEGATIVE
Glucose, UA: NEGATIVE
Ketones, UA: NEGATIVE
Leukocytes,UA: NEGATIVE
Nitrite, UA: NEGATIVE
Protein,UA: NEGATIVE
RBC, UA: NEGATIVE
Specific Gravity, UA: 1.022 (ref 1.005–1.030)
Urobilinogen, Ur: 0.2 mg/dL (ref 0.2–1.0)
pH, UA: 5.5 (ref 5.0–7.5)

## 2022-02-11 LAB — HEMOGLOBIN A1C
Est. average glucose Bld gHb Est-mCnc: 128 mg/dL
Hgb A1c MFr Bld: 6.1 % — ABNORMAL HIGH (ref 4.8–5.6)

## 2022-02-11 LAB — PSA: Prostate Specific Ag, Serum: 3.4 ng/mL (ref 0.0–4.0)

## 2022-02-11 LAB — TSH: TSH: 0.768 u[IU]/mL (ref 0.450–4.500)

## 2022-02-11 NOTE — Telephone Encounter (Signed)
Patient has been given results.  °

## 2022-02-11 NOTE — Telephone Encounter (Unsigned)
Copied from [place] [id number]. Topic: General - Other ?>> Feb 11, 2022  1:13 PM Yvette Rack wrote: ?Reason for CRM: Pt returned call to Dr. Wynetta Emery. Pt stated he can not sign in to his [facility] account because he does not remember his password. Advised pt that he could reset the password but he declined. Pt requests call back and if he does not answer to please leave a message with details of the results. Pt stated he can not just sit and wait by the phone for a return call. ?

## 2022-02-11 NOTE — Telephone Encounter (Signed)
Phone call placed to patient this morning to go over lab results.  I left message on his voicemail identifying who I am and that I was calling to go over lab results.  Informed that I will send his results to his MyChart account and I would like for him to take a look at it.  Give me a call if he has any questions. ? ?NOTE:  New PreDM ?Results for orders placed or performed in visit on 02/10/22  ?TSH  ?Result Value Ref Range  ? TSH 0.768 0.450 - 4.500 uIU/mL  ?PSA  ?Result Value Ref Range  ? Prostate Specific Ag, Serum 3.4 0.0 - 4.0 ng/mL  ?Hemoglobin A1c  ?Result Value Ref Range  ? Hgb A1c MFr Bld 6.1 (H) 4.8 - 5.6 %  ? Est. average glucose Bld gHb Est-mCnc 128 mg/dL  ?Urinalysis, Routine w reflex microscopic  ?Result Value Ref Range  ? Specific Gravity, UA 1.022 1.005 - 1.030  ? pH, UA 5.5 5.0 - 7.5  ? Color, UA Yellow Yellow  ? Appearance Ur Clear Clear  ? Leukocytes,UA Negative Negative  ? Protein,UA Negative Negative/Trace  ? Glucose, UA Negative Negative  ? Ketones, UA Negative Negative  ? RBC, UA Negative Negative  ? Bilirubin, UA Negative Negative  ? Urobilinogen, Ur 0.2 0.2 - 1.0 mg/dL  ? Nitrite, UA Negative Negative  ? Microscopic Examination Comment   ? ? ?

## 2022-02-11 NOTE — Telephone Encounter (Signed)
Copied from [place] [id number]. Topic: General - Other ?>> Feb 11, 2022  1:13 PM Yvette Rack wrote: ?Reason for CRM: Pt returned call to Dr. Wynetta Emery. Pt stated he can not sign in to his [facility] account because he does not remember his password. Advised pt that he could reset the password but he declined. Pt requests call back and if he does not answer to please leave a message with details of the results. Pt stated he can not just sit and wait by the phone for a return call. ?

## 2022-02-20 ENCOUNTER — Ambulatory Visit: Payer: Self-pay

## 2022-02-20 DIAGNOSIS — M545 Low back pain, unspecified: Secondary | ICD-10-CM

## 2022-02-20 NOTE — Addendum Note (Signed)
Addended by: Karle Plumber B on: 02/20/2022 04:59 PM   Modules accepted: Orders

## 2022-02-20 NOTE — Telephone Encounter (Signed)
  Chief Complaint: back pain Symptoms: lower back pain constant Frequency: since 5/5 during work injury Pertinent Negatives: NA Disposition: '[]'$ ED /'[]'$ Urgent Care (no appt availability in office) / '[]'$ Appointment(In office/virtual)/ '[]'$  [facility]/ '[]'$ Home Care/ '[]'$ Refused Recommended Disposition /'[]'$  Mobile Bus/ '[x]'$  Follow-up with PCP Additional Notes: pt was calling in to set up referral for back specialist since he is on restrictions at work and needs to get back to doing normal job. He is unable to take workers comp d/t pay decrease. Pt states he is needing the referral asap since he is off Wed and Thurs next week. I advised him Dr. Wynetta Emery was off today and would be Tuesday before referral was placed but pt asking if another provider can start referral. I explained to him that since office was closed Monday for holiday it would still be Tuesday before anything was scheduled. Pt verbalized understanding and asked for this to be sent today if possible so he can let his employer know whats going on.   Reason for Disposition  [1] SEVERE back pain (e.g., excruciating, unable to do any normal activities) AND [2] not improved 2 hours after pain medicine  Answer Assessment - Initial Assessment Questions 1. ONSET: "When did the pain begin?"      01/30/22 2. LOCATION: "Where does it hurt?" (upper, mid or lower back)     L side of back is worse but whole lower back  3. SEVERITY: "How bad is the pain?"  (e.g., Scale 1-10; mild, moderate, or severe)   - MILD (1-3): doesn't interfere with normal activities    - MODERATE (4-7): interferes with normal activities or awakens from sleep    - SEVERE (8-10): excruciating pain, unable to do any normal activities      9 4. PATTERN: "Is the pain constant?" (e.g., yes, no; constant, intermittent)      constant 10. OTHER SYMPTOMS: "Do you have any other symptoms?" (e.g., fever, abdominal pain, burning with urination, blood in urine)        No  Protocols used: Back Pain-A-AH

## 2022-02-20 NOTE — Telephone Encounter (Signed)
Routing to PCP for review.

## 2022-02-24 NOTE — Telephone Encounter (Signed)
Does patient need an appointment or can the referral be placed.

## 2022-02-25 NOTE — Telephone Encounter (Signed)
Noted.  Patient was called and no answer.

## 2022-03-02 ENCOUNTER — Other Ambulatory Visit: Payer: Self-pay | Admitting: Internal Medicine

## 2022-03-02 ENCOUNTER — Other Ambulatory Visit: Payer: Self-pay

## 2022-03-02 ENCOUNTER — Ambulatory Visit (INDEPENDENT_AMBULATORY_CARE_PROVIDER_SITE_OTHER): Payer: BC Managed Care – PPO | Admitting: Family Medicine

## 2022-03-02 VITALS — BP 140/92 | Ht 65.0 in | Wt 178.0 lb

## 2022-03-02 DIAGNOSIS — M5431 Sciatica, right side: Secondary | ICD-10-CM

## 2022-03-02 DIAGNOSIS — M545 Low back pain, unspecified: Secondary | ICD-10-CM

## 2022-03-02 MED ORDER — DICLOFENAC SODIUM 1 % EX GEL
4.0000 g | Freq: Four times a day (QID) | CUTANEOUS | 5 refills | Status: DC
  Filled 2022-03-02: qty 100, 7d supply, fill #0
  Filled 2022-03-23: qty 100, 7d supply, fill #1
  Filled 2022-04-28: qty 100, 7d supply, fill #2

## 2022-03-02 MED ORDER — DICLOFENAC SODIUM 75 MG PO TBEC
75.0000 mg | DELAYED_RELEASE_TABLET | Freq: Two times a day (BID) | ORAL | 1 refills | Status: DC
  Filled 2022-03-02: qty 60, 30d supply, fill #0

## 2022-03-02 NOTE — Patient Instructions (Signed)
You have a lumbar strain. Ok to take tylenol for baseline pain relief (1-2 extra strength tabs 3x/day) Take diclofenac '75mg'$  twice a day with food for pain and inflammation - do NOT take ibuprofen any more while you're on this. Stay as active as possible. Do home exercises and stretches as directed - hold each for 20-30 seconds and do each one three times. Consider physical therapy. Strengthening of low back muscles, abdominal musculature are key for long term pain relief. If not improving, will consider further imaging (MRI). Follow up with me in 1 month.
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
## 2022-03-03 ENCOUNTER — Encounter: Payer: Self-pay | Admitting: Family Medicine

## 2022-03-09 ENCOUNTER — Ambulatory Visit: Payer: BC Managed Care – PPO | Attending: Internal Medicine | Admitting: Internal Medicine

## 2022-03-09 DIAGNOSIS — S39012D Strain of muscle, fascia and tendon of lower back, subsequent encounter: Secondary | ICD-10-CM

## 2022-03-09 DIAGNOSIS — Z0289 Encounter for other administrative examinations: Secondary | ICD-10-CM
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
## 2022-03-11 ENCOUNTER — Ambulatory Visit: Payer: Self-pay | Admitting: *Deleted

## 2022-03-11 NOTE — Telephone Encounter (Signed)
Patient was injured in fall- he needs PT that will work with him and bill the store insurance.  [place] adjuster  Patient states he can not afford per visit co-pays for therapy.  Patient had virtual visit about FMLA paperwork- but didn't talk to her about the fall. Patient had PT referral- but has not been evaluated for the pain with fall. Advised patient he needs to be evaluated for the fall with severe pain. Patient states he can not afford the bills- advised ED for evaluation of back.
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
## 2022-03-12 ENCOUNTER — Encounter (HOSPITAL_COMMUNITY): Payer: Self-pay

## 2022-03-12 ENCOUNTER — Other Ambulatory Visit: Payer: Self-pay

## 2022-03-12 ENCOUNTER — Emergency Department (HOSPITAL_COMMUNITY): Payer: BC Managed Care – PPO

## 2022-03-12 ENCOUNTER — Emergency Department (HOSPITAL_COMMUNITY)
Admission: EM | Admit: 2022-03-12 | Discharge: 2022-03-12 | Disposition: A | Discharge status: Home / Self Care | Payer: BC Managed Care – PPO | Attending: Emergency Medicine | Admitting: Emergency Medicine

## 2022-03-12 DIAGNOSIS — S39012A Strain of muscle, fascia and tendon of lower back, initial encounter: Secondary | ICD-10-CM

## 2022-03-12 DIAGNOSIS — W1830XA Fall on same level, unspecified, initial encounter: Secondary | ICD-10-CM | POA: Insufficient documentation

## 2022-03-12 DIAGNOSIS — S3992XA Unspecified injury of lower back, initial encounter: Secondary | ICD-10-CM | POA: Diagnosis not present

## 2022-03-12 DIAGNOSIS — M545 Low back pain, unspecified: Secondary | ICD-10-CM

## 2022-03-12 DIAGNOSIS — Z043 Encounter for examination and observation following other accident: Secondary | ICD-10-CM | POA: Diagnosis not present

## 2022-03-12 MED ORDER — METHOCARBAMOL 500 MG PO TABS
500.0000 mg | ORAL_TABLET | Freq: Every evening | ORAL | 0 refills | Status: DC | PRN
  Filled 2022-03-12: qty 20, 20d supply, fill #0

## 2022-03-12 MED ORDER — IBUPROFEN 600 MG PO TABS
600.0000 mg | ORAL_TABLET | Freq: Four times a day (QID) | ORAL | 0 refills | Status: DC | PRN
  Filled 2022-03-12: qty 30, 8d supply, fill #0

## 2022-03-12 NOTE — Discharge Instructions (Addendum)
You have been evaluated for your low back injury.  Fortunately x-ray did not show any broken bone.  You may take muscle relaxant and anti-inflammatory occasion as needed for pain.  You may follow-up with orthopedist doctor for further evaluation and management of your condition.

## 2022-03-12 NOTE — ED Provider Notes (Signed)
[facility] DEPT Provider Note   CSN: [id number] Arrival date & time: 03/12/22  1413     History  Chief Complaint  Patient presents with   Back Injury    Darren [name] is a 61 y.o. male.  The history is provided by the patient and medical records. No language interpreter was used.    61 year old male significant history of chronic low back pain, ascending aortic aneurysm presenting to ED for evaluation of back pain.  Patient reports 9 days ago he was using a commode at [employer], and when he reached over to grab some toilet paper, the commode tipped over and he fell down on the ground.  He reported cute onset of pain to his lower back from the fall.  He denies hitting his head or loss of consciousness.  Since then he has had recurrent pain about the lower back.  He described pain as a sharp sensation across his back, nonradiating, 10 out of 10 not out of control despite using home medication including diclofenac cream and pill.  He mention he reach out to [employer] and they recommend for him to be evaluated in the ER.  He denies any new numbness or weakness denies bowel bladder incontinence or saddle anesthesia.  He has been able to ambulate.  Does have history of chronic back pain.  Home Medications Prior to Admission medications   Medication Sig Start Date End Date Taking? Authorizing Provider  amLODipine (NORVASC) 5 MG tablet TAKE 1 TABLET (5 MG TOTAL) BY MOUTH DAILY. 02/10/22 02/10/23  Ladell Pier, MD  atorvastatin (LIPITOR) 10 MG tablet TAKE 1 TABLET (10 MG TOTAL) BY MOUTH DAILY. 12/29/21 12/29/22  Ladell Pier, MD  cetirizine (ZYRTEC) 10 MG tablet Take 1 tablet (10 mg total) by mouth daily. 10/08/21   Mayers, Cari S, PA-C  diclofenac (VOLTAREN) 75 MG EC tablet Take 1 tablet (75 mg total) by mouth 2 (two) times daily. 03/02/22   Hudnall, Sharyn Lull, MD  diclofenac Sodium (VOLTAREN) 1 % GEL APPLY 4 G TOPICALLY 4 (FOUR) TIMES DAILY. 03/02/22 03/02/23   Ladell Pier, MD  levothyroxine (SYNTHROID) 175 MCG tablet TAKE 1 TABLET (175 MCG TOTAL) BY MOUTH DAILY. 02/10/22 02/10/23  Ladell Pier, MD  losartan (COZAAR) 50 MG tablet TAKE 1 TABLET (50 MG TOTAL) BY MOUTH DAILY. 12/29/21 12/29/22  Ladell Pier, MD  methocarbamol (ROBAXIN) 500 MG tablet Take 1 tablet (500 mg total) by mouth at bedtime as needed for muscle spasms. 02/10/22   Ladell Pier, MD  neomycin-polymyxin-hydrocortisone (CORTISPORIN) OTIC solution Place 4 drops into both ears 4 (four) times daily. 12/30/21   Ladell Pier, MD  ofloxacin (FLOXIN OTIC) 0.3 % OTIC solution Place 5 drops into both ears daily. 12/26/21   Hassell Done, Mary-Margaret, FNP  pantoprazole (PROTONIX) 40 MG tablet TAKE 1 TABLET (40 MG TOTAL) BY MOUTH DAILY. 02/10/22 02/10/23  Ladell Pier, MD  tamsulosin (FLOMAX) 0.4 MG CAPS capsule Take 1 capsule (0.4 mg total) by mouth daily. 02/10/22   Ladell Pier, MD      Allergies    Patient has no known allergies.    Review of Systems   Review of Systems  All other systems reviewed and are negative.   Physical Exam Updated Vital Signs BP (!) 137/105 (BP Location: Right Arm)   Pulse 62   Temp 98 F (36.7 C) (Oral)   Resp 16   Ht '5\' 5"'$  (1.651 m)   Wt  80.7 kg   SpO2 97%   BMI 29.62 kg/m  Physical Exam Vitals and nursing note reviewed.  Constitutional:      General: He is not in acute distress.    Appearance: He is well-developed.  HENT:     Head: Atraumatic.  Eyes:     Conjunctiva/sclera: Conjunctivae normal.  Musculoskeletal:        General: Tenderness (Tenderness along lumbar spine without crepitus or step-off noted.  Negative straight leg raise bilaterally.  No overlying skin changes.) present.     Cervical back: Neck supple.  Skin:    Findings: No rash.  Neurological:     Mental Status: He is alert.     ED Results / Procedures / Treatments   Labs (all labs ordered are listed, but only abnormal results are displayed) Labs  Reviewed - No data to display  EKG None  Radiology DG Lumbar Spine Complete  Result Date: 03/12/2022 CLINICAL DATA:  Provided history: Fall. EXAM: LUMBAR SPINE - COMPLETE 4+ VIEW COMPARISON:  CT chest/abdomen/pelvis 09/16/2021. Lumbar spine radiographs 07/14/2016. FINDINGS: Five lumbar vertebrae. The caudal most well-formed dual disc space is designated L5-S1. Chronic L4-L5 grade 1 anterolisthesis. Lumbar vertebral body height is maintained. No evidence of acute fracture to the lumbar spine. At L5-S1, there is advanced disc space narrowing with degenerative endplate sclerosis and endplate osteophytes. No more than mild disc space narrowing at the remaining lumbar levels. Multilevel facet arthrosis. Most notably, advanced facet arthrosis is present at L4-L5 and L5-S1. IMPRESSION: No radiographic evidence of acute fracture to the lumbar spine. Lumbar spondylosis, as described and greatest at L5-S1. Chronic L4-L5 grade 1 anterolisthesis. Electronically Signed   By: Kellie Simmering D.O.   On: 03/12/2022 15:07    Procedures Procedures    Medications Ordered in ED Medications - No data to display  ED Course/ Medical Decision Making/ A&P                           Medical Decision Making  BP (!) 137/105 (BP Location: Right Arm)   Pulse 62   Temp 98 F (36.7 C) (Oral)   Resp 16   Ht '5\' 5"'$  (1.651 m)   Wt 80.7 kg   SpO2 97%   BMI 29.62 kg/m   2:37 PM This is a 61 year old male with history of chronic back pain presenting requesting for evaluation of his recent low back injury.  Patient reported 9 days ago he was sitting on a commode at [employer] and when he reached over to grab some toilet paper, the commode tipped and he fell down to the ground and hurt his lower back.  He has been having persistent pain to his lower back and request to be evaluated including x-rays.  He has been able to ambulate.  He denies any red flags.  On exam he does have some reproducible tenderness along lumbar spine at  the level of L2-L4 without any crepitus or step-off and no overlying skin changes.  He is neurovascular intact throughout.  Will obtain x-ray per request.          Final Clinical Impression(s) / ED Diagnoses Final diagnoses:  Low back strain, initial encounter    Rx / DC Orders ED Discharge Orders          Ordered    methocarbamol (ROBAXIN) 500 MG tablet  At bedtime PRN        03/12/22 1513    ibuprofen (ADVIL)  600 MG tablet  Every 6 hours PRN        03/12/22 1513              Domenic Moras, PA-C 03/12/22 1520    Dorie Rank, MD 03/13/22 1459

## 2022-03-12 NOTE — ED Triage Notes (Signed)
Patient states that he was at [employer] 9 days ago and the toilet fell while he was using it and he fell with the toilet injuring his lower back. Patient states slight pain radiating down his legs.

## 2022-03-12 NOTE — Telephone Encounter (Signed)
Called pt unable to reach left VM to call back

## 2022-03-16 ENCOUNTER — Other Ambulatory Visit: Payer: Self-pay

## 2022-03-16 ENCOUNTER — Telehealth: Payer: Self-pay | Admitting: Internal Medicine

## 2022-03-16 DIAGNOSIS — S39012D Strain of muscle, fascia and tendon of lower back, subsequent encounter: Secondary | ICD-10-CM

## 2022-03-16 NOTE — Telephone Encounter (Signed)
Copied from [place]. Topic: Referral - Request for Referral >> Mar 16, 2022 12:15 PM Everette C wrote: Has patient seen PCP for this complaint? Yes.   *If NO, is insurance requiring patient see PCP for this issue before PCP can refer them? Referral for which specialty: [physical therapist / chiropractor  Preferred provider/office: Patient needs them to accept workman's comp  Reason for referral: lower back discomfort

## 2022-03-17 NOTE — Telephone Encounter (Addendum)
Pt called back and stated PT does not work with the situation he is in. Agricultural consultant asked him to get with a PT for workers comp.  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  Adjuster will pay at the end.   Pt mentioned he does not have $60.00 to pay every time he goes to PT.   Pt is requesting a call back to discuss.

## 2022-03-17 NOTE — Telephone Encounter (Signed)
Patient has an apt scheduled with PT 03/20/2022. Left message on voicemail per DPR. Encourage to call if they have additional questions or concerns.

## 2022-03-17 NOTE — Telephone Encounter (Signed)
Patient states he is not happy with the PT office that he was referred to. He does not wish to pay $60 for 3 appt in a week.   He wishes that we, our office would call around to see who will service him until he settles and makes payments at the end of his treatment.  Advise patient that I could ask for another referral to be placed and when that office called, he could find out if they will honor his request above. Informed patient he would need to call around to see if PT offices or Chiropractor offices would help him with his payment arrangement.

## 2022-03-20 ENCOUNTER — Ambulatory Visit: Payer: BC Managed Care – PPO | Attending: Internal Medicine

## 2022-03-23 ENCOUNTER — Other Ambulatory Visit: Payer: Self-pay

## 2022-03-24 ENCOUNTER — Other Ambulatory Visit: Payer: Self-pay

## 2022-03-24 NOTE — Telephone Encounter (Signed)
Pt was called and a VM was left informing patient of referral being placed. Phone number was also left so that he can reach out.

## 2022-03-25 ENCOUNTER — Ambulatory Visit: Payer: Self-pay | Admitting: *Deleted

## 2022-03-25 NOTE — Telephone Encounter (Signed)
  Summary: rash   Pt has small red rash on right leg and arm and thinks is poison ivy or oak from being in the yard / pt is experiencing itching / pt would like advise        Chief Complaint: red scratches to right arm and right leg Symptoms: pulled unknown vines from house and appt. 3 days ago and noted red scratches to right arm form hand to elbow and right leg from ankle to knee. Itching severe at times. No blisters or drainage from site no fever noted.  Frequency: 3 days ago Pertinent Negatives: Patient denies difficulty breathing no fever  Disposition: '[]'$ ED /'[]'$ Urgent Care (no appt availability in office) / '[]'$ Appointment(In office/virtual)/ '[]'$  [facility]/ '[x]'$ Home Care/ '[]'$ Refused Recommended Disposition /'[]'$ [facility]/ '[]'$  Follow-up with PCP Additional Notes Patient requesting call back regarding referral to chiropractor. Gave patient message in chart referral from 03/24/22 to [facility] and address. Patient reports he was not able to write down information . Please advise .        Reason for Disposition  Mild localized rash  Answer Assessment - Initial Assessment Questions 1. APPEARANCE of RASH: "Describe the rash."      Red scratches not raised  2. LOCATION: "Where is the rash located?"      Right arm elbow to hand and right leg from ankle to knee 3. NUMBER: "How many spots are there?"      2  4. SIZE: "How big are the spots?" (Inches, centimeters or compare to size of a coin)      Long scratches 5. ONSET: "When did the rash start?"      3 days  6. ITCHING: "Does the rash itch?" If Yes, ask: "How bad is the itch?"  (Scale 0-10; or none, mild, moderate, severe)     Yes  7. PAIN: "Does the rash hurt?" If Yes, ask: "How bad is the pain?"  (Scale 0-10; or none, mild, moderate, severe)    - NONE (0): no pain    - MILD (1-3): doesn't interfere with normal activities     - MODERATE (4-7): interferes with normal activities or awakens from sleep     -  SEVERE (8-10): excruciating pain, unable to do any normal activities     No,irritating  8. OTHER SYMPTOMS: "Do you have any other symptoms?" (e.g., fever)     No  9. PREGNANCY: "Is there any chance you are pregnant?" "When was your last menstrual period?"     na  Protocols used: Rash or Redness - Localized-A-AH

## 2022-03-25 NOTE — Telephone Encounter (Signed)
Summary: rash   Pt has small red rash on right leg and arm and thinks is poison ivy or oak from being in the yard / pt is experiencing itching / pt would like advise     No answer message left.

## 2022-03-26 ENCOUNTER — Other Ambulatory Visit: Payer: Self-pay

## 2022-03-26 ENCOUNTER — Ambulatory Visit: Payer: Self-pay

## 2022-03-26 NOTE — Telephone Encounter (Signed)
  Chief Complaint: Medication questions Symptoms: Pt received flomax,which he does not need and,is causing financial hardship.  Pt did not receive lipitor which he states he is out of. Pt also claims that pharmacy does not pick up.the phone. Frequency:  Pertinent Negatives: Patient denies  Disposition: '[]'$ ED /'[]'$ Urgent Care (no appt availability in office) / '[]'$ Appointment(In office/virtual)/ '[]'$  [facility]/ '[]'$ Home Care/ '[]'$ Refused Recommended Disposition /'[]'$ [facility]/ '[]'$  Follow-up with PCP Additional Notes: Called pharmacy Lipitor was not due for refill. Medication will be filled and sent out tomorrow. There is no co-pay for this medication. PT cannot return the Flomax, and pt should need that refill. Cost for Flomax is $4.00.   Called pt back. Pt states that Flomax works, but causes him gastrointestinal issues so he was not taking it as prescribed.      Summary: Medication Medication Advice   Pt is calling to state that he received some medications by way of mail and he does not know what the medication are for please advise     Reason for Disposition  [1] Caller has NON-URGENT medicine question about med that PCP prescribed AND [2] triager unable to answer question  Answer Assessment - Initial Assessment Questions 1. NAME of MEDICATION: "What medicine are you calling about?"     Flomax and atorvastatin 2. QUESTION: "What is your question?" (e.g., double dose of medicine, side effect)     Why did pharmacy send flomax and not send lipitor 3. PRESCRIBING HCP: "Who prescribed it?" Reason: if prescribed by specialist, call should be referred to that group.     Dr. Wynetta Emery 4. SYMPTOMS: "Do you have any symptoms?"     no 5. SEVERITY: If symptoms are present, ask "Are they mild, moderate or severe?"      6. PREGNANCY:  "Is there any chance that you are pregnant?" "When was your last menstrual period?"     na  Protocols used: Medication Question Call-A-AH

## 2022-03-30 NOTE — Telephone Encounter (Signed)
Patient states he no longer needs chiropractor referral due to obtaining an attorney and attorney referring him to one

## 2022-04-03 ENCOUNTER — Other Ambulatory Visit: Payer: Self-pay | Admitting: Internal Medicine

## 2022-04-03 ENCOUNTER — Other Ambulatory Visit (HOSPITAL_COMMUNITY): Payer: Self-pay

## 2022-04-03 ENCOUNTER — Other Ambulatory Visit: Payer: Self-pay

## 2022-04-03 DIAGNOSIS — I10 Essential (primary) hypertension: Secondary | ICD-10-CM

## 2022-04-03 MED ORDER — LOSARTAN POTASSIUM 50 MG PO TABS
ORAL_TABLET | Freq: Every day | ORAL | 0 refills | Status: DC
  Filled 2022-04-03: qty 30, 30d supply, fill #0
  Filled 2022-04-03: qty 90, 90d supply, fill #0

## 2022-04-03 NOTE — Telephone Encounter (Signed)
Requested medication (s) are due for refill today: yes  Requested medication (s) are on the active medication list: yes  Last refill:  12/29/21 #90/0  Future visit scheduled: yes  Notes to clinic:  Unable to refill per protocol due to failed labs, no updated results.      Requested Prescriptions  Pending Prescriptions Disp Refills   losartan (COZAAR) 50 MG tablet 90 tablet 0    Sig: TAKE 1 TABLET (50 MG TOTAL) BY MOUTH DAILY.     Cardiovascular:  Angiotensin Receptor Blockers Failed - 04/03/2022  1:54 PM      Failed - Cr in normal range and within 180 days    Creatinine  Date Value Ref Range Status  09/16/2021 0.82 0.61 - 1.24 mg/dL Final   Creat  Date Value Ref Range Status  07/09/2016 0.79 0.70 - 1.33 mg/dL Final    Comment:      For patients > or = 61 years of age: The upper reference limit for Creatinine is approximately 13% higher for people identified as African-American.            Failed - K in normal range and within 180 days    Potassium  Date Value Ref Range Status  09/16/2021 3.9 3.5 - 5.1 mmol/L Final         Failed - Last BP in normal range    BP Readings from Last 1 Encounters:  03/12/22 (!) 130/98         Passed - Patient is not pregnant      Passed - Valid encounter within last 6 months    Recent Outpatient Visits           3 weeks ago Lumbar strain, subsequent encounter   [place], MD   1 month ago Essential hypertension   [place], MD   7 months ago Essential hypertension   [place], [place], [place]   1 year ago Essential hypertension   [place], Deborah B, MD   1 year ago Essential hypertension   [place], MD       Future Appointments             In 1 week Chesley Mires, MD [facility]    In 3 months Ladell Pier, MD [place]

## 2022-04-04 ENCOUNTER — Other Ambulatory Visit (HOSPITAL_COMMUNITY): Payer: Self-pay

## 2022-04-09 ENCOUNTER — Other Ambulatory Visit: Payer: Self-pay

## 2022-04-13 ENCOUNTER — Ambulatory Visit: Payer: BC Managed Care – PPO | Admitting: Pulmonary Disease

## 2022-04-28 ENCOUNTER — Other Ambulatory Visit: Payer: Self-pay

## 2022-05-18 ENCOUNTER — Other Ambulatory Visit: Payer: Self-pay

## 2022-05-20 ENCOUNTER — Other Ambulatory Visit: Payer: Self-pay

## 2022-06-15 ENCOUNTER — Other Ambulatory Visit: Payer: Self-pay

## 2022-06-18 ENCOUNTER — Other Ambulatory Visit: Payer: Self-pay

## 2022-06-25 ENCOUNTER — Other Ambulatory Visit: Payer: Self-pay

## 2022-07-07 ENCOUNTER — Other Ambulatory Visit: Payer: Self-pay | Admitting: Internal Medicine

## 2022-07-07 ENCOUNTER — Other Ambulatory Visit: Payer: Self-pay

## 2022-07-07 DIAGNOSIS — I10 Essential (primary) hypertension: Secondary | ICD-10-CM

## 2022-07-07 MED ORDER — LOSARTAN POTASSIUM 50 MG PO TABS
50.0000 mg | ORAL_TABLET | Freq: Every day | ORAL | 0 refills | Status: DC
  Filled 2022-07-07: qty 30, 30d supply, fill #0

## 2022-07-10 ENCOUNTER — Other Ambulatory Visit: Payer: Self-pay

## 2022-07-13 ENCOUNTER — Ambulatory Visit: Payer: BC Managed Care – PPO | Admitting: Internal Medicine

## 2022-08-06 ENCOUNTER — Other Ambulatory Visit: Payer: Self-pay | Admitting: Thoracic Surgery (Cardiothoracic Vascular Surgery)

## 2022-08-06 DIAGNOSIS — I7121 Aneurysm of the ascending aorta, without rupture: Secondary | ICD-10-CM

## 2022-08-07 ENCOUNTER — Other Ambulatory Visit: Payer: Self-pay

## 2022-08-07 ENCOUNTER — Other Ambulatory Visit: Payer: Self-pay | Admitting: Internal Medicine

## 2022-08-07 DIAGNOSIS — I10 Essential (primary) hypertension: Secondary | ICD-10-CM

## 2022-08-07 MED ORDER — LOSARTAN POTASSIUM 50 MG PO TABS
50.0000 mg | ORAL_TABLET | Freq: Every day | ORAL | 0 refills | Status: DC
  Filled 2022-08-07: qty 90, 90d supply, fill #0
  Filled 2022-08-12 (×2): qty 30, 30d supply, fill #0
  Filled 2022-09-09: qty 30, 30d supply, fill #1

## 2022-08-07 NOTE — Telephone Encounter (Signed)
Requested Prescriptions  Pending Prescriptions Disp Refills   losartan (COZAAR) 50 MG tablet 90 tablet 0    Sig: Take 1 tablet (50 mg total) by mouth daily.     Cardiovascular:  Angiotensin Receptor Blockers Failed - 08/07/2022 12:11 PM      Failed - Cr in normal range and within 180 days    Creatinine  Date Value Ref Range Status  09/16/2021 0.82 0.61 - 1.24 mg/dL Final   Creat  Date Value Ref Range Status  07/09/2016 0.79 0.70 - 1.33 mg/dL Final    Comment:      For patients > or = 61 years of age: The upper reference limit for Creatinine is approximately 13% higher for people identified as African-American.            Failed - K in normal range and within 180 days    Potassium  Date Value Ref Range Status  09/16/2021 3.9 3.5 - 5.1 mmol/L Final         Failed - Last BP in normal range    BP Readings from Last 1 Encounters:  03/12/22 (!) 130/98         Passed - Patient is not pregnant      Passed - Valid encounter within last 6 months    Recent Outpatient Visits           5 months ago Lumbar strain, subsequent encounter   [place], MD   5 months ago Essential hypertension   [place], MD   11 months ago Essential hypertension   [place], [place], [place]   1 year ago Essential hypertension   [place], Deborah B, MD   1 year ago Essential hypertension   [place], MD       Future Appointments             In 1 month Wynetta Emery Dalbert Batman, MD [place]

## 2022-08-12 ENCOUNTER — Other Ambulatory Visit: Payer: Self-pay

## 2022-08-17 ENCOUNTER — Other Ambulatory Visit: Payer: Self-pay

## 2022-08-18 ENCOUNTER — Other Ambulatory Visit: Payer: Self-pay

## 2022-09-09 ENCOUNTER — Other Ambulatory Visit: Payer: Self-pay | Admitting: Internal Medicine

## 2022-09-09 ENCOUNTER — Telehealth: Payer: Self-pay | Admitting: Internal Medicine

## 2022-09-09 ENCOUNTER — Other Ambulatory Visit: Payer: Self-pay

## 2022-09-09 DIAGNOSIS — I10 Essential (primary) hypertension: Secondary | ICD-10-CM

## 2022-09-09 DIAGNOSIS — E039 Hypothyroidism, unspecified: Secondary | ICD-10-CM

## 2022-09-09 MED ORDER — LEVOTHYROXINE SODIUM 175 MCG PO TABS
175.0000 ug | ORAL_TABLET | Freq: Every day | ORAL | 1 refills | Status: DC
  Filled 2022-09-09: qty 90, 90d supply, fill #0
  Filled 2022-12-09: qty 30, 30d supply, fill #1
  Filled 2023-01-13: qty 30, 30d supply, fill #2

## 2022-09-09 MED ORDER — LOSARTAN POTASSIUM 50 MG PO TABS
50.0000 mg | ORAL_TABLET | Freq: Every day | ORAL | 0 refills | Status: DC
  Filled 2022-09-09: qty 30, 30d supply, fill #0

## 2022-09-09 NOTE — Telephone Encounter (Addendum)
Patient is out of  Levothyroxine Sodium 175 MCG , says he was told by pharmacy, this was denied, I'm not showing this. He is asking for short supply unitl refill comes in. [place] Phone: [phone]  Fax: [phone]

## 2022-09-09 NOTE — Telephone Encounter (Signed)
Rx was sent in today by [place]. #30/0.  Requested Prescriptions  Pending Prescriptions Disp Refills   levothyroxine (SYNTHROID) 175 MCG tablet 90 tablet 1    Sig: TAKE 1 TABLET (175 MCG TOTAL) BY MOUTH DAILY.     Endocrinology:  Hypothyroid Agents Passed - 09/09/2022 12:50 PM      Passed - TSH in normal range and within 360 days    TSH  Date Value Ref Range Status  02/10/2022 0.768 0.450 - 4.500 uIU/mL Final         Passed - Valid encounter within last 12 months    Recent Outpatient Visits           6 months ago Lumbar strain, subsequent encounter   [place] Ladell Pier, MD   7 months ago Essential hypertension   [place], Deborah B, MD   1 year ago Essential hypertension   [facility] [place], Dionne Bucy, [place]   1 year ago Essential hypertension   [place] Ladell Pier, MD   1 year ago Essential hypertension   [place], MD       Future Appointments             In 6 days Ladell Pier, MD [place]            Refused Prescriptions Disp Refills   losartan (COZAAR) 50 MG tablet 90 tablet 0    Sig: Take 1 tablet (50 mg total) by mouth daily. (Must keep upcoming office visit for refills)     Cardiovascular:  Angiotensin Receptor Blockers Failed - 09/09/2022 12:50 PM      Failed - Cr in normal range and within 180 days    Creatinine  Date Value Ref Range Status  09/16/2021 0.82 0.61 - 1.24 mg/dL Final   Creat  Date Value Ref Range Status  07/09/2016 0.79 0.70 - 1.33 mg/dL Final    Comment:      For patients > or = 61 years of age: The upper reference limit for Creatinine is approximately 13% higher for people identified as African-American.            Failed - K in normal range and within 180 days    Potassium  Date  Value Ref Range Status  09/16/2021 3.9 3.5 - 5.1 mmol/L Final         Failed - Last BP in normal range    BP Readings from Last 1 Encounters:  03/12/22 (!) 130/98         Failed - Valid encounter within last 6 months    Recent Outpatient Visits           6 months ago Lumbar strain, subsequent encounter   [place], MD   7 months ago Essential hypertension   [place], Deborah B, MD   1 year ago Essential hypertension   [place], Dionne Bucy, [place]   1 year ago Essential hypertension   [place] Ladell Pier, MD   1 year ago Essential hypertension   [place], Patrick E, MD       Future Appointments  In 6 days Ladell Pier, MD [place] - Patient is not pregnant

## 2022-09-09 NOTE — Telephone Encounter (Signed)
Medication Refill - Medication: losartan (COZAAR) 50 MG tablet (only received 30 tablets 08/07/2022. He never received 90 tablets because he could not afford it).   levothyroxine (SYNTHROID) 175 MCG tablet   Has the patient contacted their pharmacy? Yes.   (Agent: If no, request that the patient contact the pharmacy for the refill. If patient does not wish to contact the pharmacy document the reason why and proceed with request.) (Agent: If yes, when and what did the pharmacy advise?)  Preferred Pharmacy (with phone number or street name):  [facility] [address], [place] [address]  Phone: [phone] Fax: [phone]   Has the patient been seen for an appointment in the last year OR does the patient have an upcoming appointment? Yes.    Agent: Please be advised that RX refills may take up to 3 business days. We ask that you follow-up with your pharmacy.

## 2022-09-10 ENCOUNTER — Other Ambulatory Visit: Payer: Self-pay

## 2022-09-10 NOTE — Telephone Encounter (Signed)
Pt states he was advised by pharmacy the do not have Rx for losartan (COZAAR) 50 MG tablet    Please fu w/ pt once Rx is sent

## 2022-09-10 NOTE — Telephone Encounter (Signed)
Pt states he was advised by pharmacy the do not have Rx for Levothyroxine Sodium 175 MCG    Please fu w/ pt once Rx is sent

## 2022-09-10 NOTE — Telephone Encounter (Signed)
Spoke with pharmacy, both meds were mailed out today. Left message on pt's VM, HIPAA appropriate.

## 2022-09-15 ENCOUNTER — Telehealth: Payer: Self-pay | Admitting: Hematology and Oncology

## 2022-09-15 ENCOUNTER — Other Ambulatory Visit: Payer: Self-pay

## 2022-09-15 ENCOUNTER — Ambulatory Visit
Admission: RE | Admit: 2022-09-15 | Discharge: 2022-09-15 | Disposition: A | Discharge status: Home / Self Care | Payer: BC Managed Care – PPO | Source: Ambulatory Visit | Attending: Internal Medicine | Admitting: Internal Medicine

## 2022-09-15 ENCOUNTER — Ambulatory Visit: Payer: BC Managed Care – PPO | Attending: Internal Medicine | Admitting: Internal Medicine

## 2022-09-15 ENCOUNTER — Encounter: Payer: Self-pay | Admitting: Internal Medicine

## 2022-09-15 VITALS — BP 130/82 | HR 61 | Temp 97.5°F | Ht 65.0 in | Wt 180.0 lb

## 2022-09-15 DIAGNOSIS — K2270 Barrett's esophagus without dysplasia: Secondary | ICD-10-CM

## 2022-09-15 DIAGNOSIS — Z23 Encounter for immunization: Secondary | ICD-10-CM | POA: Diagnosis not present

## 2022-09-15 DIAGNOSIS — I10 Essential (primary) hypertension: Secondary | ICD-10-CM

## 2022-09-15 DIAGNOSIS — Z1211 Encounter for screening for malignant neoplasm of colon: Secondary | ICD-10-CM | POA: Diagnosis not present

## 2022-09-15 DIAGNOSIS — C9110 Chronic lymphocytic leukemia of B-cell type not having achieved remission: Secondary | ICD-10-CM

## 2022-09-15 DIAGNOSIS — E663 Overweight: Secondary | ICD-10-CM

## 2022-09-15 DIAGNOSIS — E782 Mixed hyperlipidemia: Secondary | ICD-10-CM | POA: Diagnosis not present

## 2022-09-15 DIAGNOSIS — Z0000 Encounter for general adult medical examination without abnormal findings: Secondary | ICD-10-CM

## 2022-09-15 DIAGNOSIS — G8929 Other chronic pain: Secondary | ICD-10-CM

## 2022-09-15 DIAGNOSIS — E039 Hypothyroidism, unspecified: Secondary | ICD-10-CM | POA: Diagnosis not present

## 2022-09-15 DIAGNOSIS — M1711 Unilateral primary osteoarthritis, right knee: Secondary | ICD-10-CM | POA: Diagnosis not present

## 2022-09-15 DIAGNOSIS — Z0001 Encounter for general adult medical examination with abnormal findings: Secondary | ICD-10-CM

## 2022-09-15 DIAGNOSIS — R7303 Prediabetes: Secondary | ICD-10-CM

## 2022-09-15 DIAGNOSIS — M25561 Pain in right knee: Secondary | ICD-10-CM

## 2022-09-15 DIAGNOSIS — M545 Low back pain, unspecified: Secondary | ICD-10-CM

## 2022-09-15 DIAGNOSIS — Z6829 Body mass index (BMI) 29.0-29.9, adult: Secondary | ICD-10-CM

## 2022-09-15 DIAGNOSIS — I7121 Aneurysm of the ascending aorta, without rupture: Secondary | ICD-10-CM

## 2022-09-15 MED ORDER — ATORVASTATIN CALCIUM 10 MG PO TABS
10.0000 mg | ORAL_TABLET | Freq: Every day | ORAL | 2 refills | Status: DC
  Filled 2022-09-15: qty 90, fill #0
  Filled 2022-10-08: qty 30, 30d supply, fill #0
  Filled 2022-11-10: qty 30, 30d supply, fill #1
  Filled 2022-12-09: qty 30, 30d supply, fill #2
  Filled 2023-01-13: qty 30, 30d supply, fill #3
  Filled 2023-02-10: qty 30, 30d supply, fill #4
  Filled 2023-03-19: qty 30, 30d supply, fill #5
  Filled 2023-03-19: qty 30, 30d supply, fill #0
  Filled 2023-04-21: qty 30, 30d supply, fill #1
  Filled 2023-05-19 – 2023-05-24 (×2): qty 30, 30d supply, fill #2

## 2022-09-15 MED ORDER — AMLODIPINE BESYLATE 5 MG PO TABS
5.0000 mg | ORAL_TABLET | Freq: Every day | ORAL | 2 refills | Status: DC
  Filled 2022-09-15: qty 90, fill #0
  Filled 2022-11-10: qty 90, 90d supply, fill #0

## 2022-09-15 MED ORDER — LOSARTAN POTASSIUM 50 MG PO TABS
50.0000 mg | ORAL_TABLET | Freq: Every day | ORAL | 1 refills | Status: DC
  Filled 2022-09-15: qty 90, 90d supply, fill #0
  Filled 2022-10-08: qty 30, 30d supply, fill #0
  Filled 2022-11-10: qty 30, 30d supply, fill #1
  Filled 2022-12-09: qty 30, 30d supply, fill #2
  Filled 2023-01-13: qty 30, 30d supply, fill #3

## 2022-09-15 NOTE — Telephone Encounter (Signed)
Called patient back to give more information about MD visit. Patient decided to cancel appointment and will call back at later time to r/s

## 2022-09-15 NOTE — Telephone Encounter (Signed)
Patient called to r/s appointment. Informed patient he needs to call [facility] to r/s tests. Patient will call back once he speaks with them.

## 2022-09-15 NOTE — Patient Instructions (Addendum)
Please stop downstairs in [facility] to get x-rays done on the right knee.  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   Preventive Care 55-61 Years Old, Male Preventive care refers to lifestyle choices and visits with your health care provider that can promote health and wellness. Preventive care visits are also called wellness exams. What can I expect for my preventive care visit? Counseling During your preventive care visit, your health care provider may ask about your: Medical history, including: Past medical problems. Family medical history. Current health, including: Emotional well-being. Home life and relationship well-being. Sexual activity. Lifestyle, including: Alcohol, nicotine or tobacco, and drug use. Access to firearms. Diet, exercise, and sleep habits. Safety issues such as seatbelt and bike helmet use. Sunscreen use. Work and work Statistician. Physical exam Your health care provider will check your: Height and weight. These may be used to calculate your BMI (body mass index). BMI is a measurement that tells if you are at a healthy weight. Waist circumference. This measures the distance around your waistline. This measurement also tells if you are at a healthy weight and may help predict your risk of certain diseases, such as type 2 diabetes and high blood pressure. Heart rate and blood pressure. Body temperature. Skin for abnormal spots. What immunizations do I need?  Vaccines are usually given at various ages, according to a schedule. Your health care provider will recommend vaccines for you based on your age, medical history, and lifestyle or other factors, such as travel or where you work. What tests do I need? Screening Your health care provider may recommend screening tests for certain conditions. This may include: Lipid and  cholesterol levels. Diabetes screening. This is done by checking your blood sugar (glucose) after you have not eaten for a while (fasting). Hepatitis B test. Hepatitis C test. HIV (human immunodeficiency virus) test. STI (sexually transmitted infection) testing, if you are at risk. Lung cancer screening. Prostate cancer screening. Colorectal cancer screening. Talk with your health care provider about your test results, treatment options, and if necessary, the need for more tests. Follow these instructions at home: Eating and drinking  Eat a diet that includes fresh fruits and vegetables, whole grains, lean protein, and low-fat dairy products. Take vitamin and mineral supplements as recommended by your health care provider. Do not drink alcohol if your health care provider tells you not to drink. If you drink alcohol: Limit how much you have to 0-2 drinks a day. Know how much alcohol is in your drink. In the [place] one drink equals one 12 oz bottle of beer (355 mL), one 5 oz glass of wine (148 mL), or one 1 oz glass of hard liquor (44 mL). Lifestyle Brush your teeth every morning and night with fluoride toothpaste. Floss one time each day. Exercise for at least 30 minutes 5 or more days each week. Do not use any products that contain nicotine or tobacco. These products include cigarettes, chewing tobacco, and vaping devices, such as e-cigarettes. If you need help quitting, ask your health care provider. Do not use drugs. If you are sexually active, practice safe sex. Use a condom or other form of protection to prevent STIs. Take aspirin only as told by your health care provider. Make sure that you understand how much to take and what form to take. Work with your  health care provider to find out whether it is safe and beneficial for you to take aspirin daily. Find healthy ways to manage stress, such as: Meditation, yoga, or listening to music. Journaling. Talking to a trusted  person. Spending time with friends and family. Minimize exposure to UV radiation to reduce your risk of skin cancer. Safety Always wear your seat belt while driving or riding in a vehicle. Do not drive: If you have been drinking alcohol. Do not ride with someone who has been drinking. When you are tired or distracted. While texting. If you have been using any mind-altering substances or drugs. Wear a helmet and other protective equipment during sports activities. If you have firearms in your house, make sure you follow all gun safety procedures. What's next? Go to your health care provider once a year for an annual wellness visit. Ask your health care provider how often you should have your eyes and teeth checked. Stay up to date on all vaccines. This information is not intended to replace advice given to you by your health care provider. Make sure you discuss any questions you have with your health care provider. Document Revised: 03/12/2021 Document Reviewed: 03/12/2021 Elsevier Patient Education  [place].

## 2022-09-15 NOTE — Progress Notes (Signed)
Patient ID: Darren [name], male    DOB: Nov 24, 1960  MRN: [id number]  CC: Annual Exam (Annual physical. Med refill./R knee pain X7 mo, back pain. Neoma Laming received flu vax this season)   Subjective: Darren [name] is a 61 y.o. male who presents for annual exam His concerns today include:  Pt with hx of GERD, hypothyroid, HTN, HL, centrilobular emphysema, CLL followed by Dr. Lottie Rater, ascending aortic aneurysm (followed by vascular), chronic LBP and Barrett's esophagus.   Hx of COVID infection.   C/o pain RT knee  and lower back since 03/03/2022  due to fall at [employer] that day.[employer] [employer] He was using the restroom there and turned to flush toilet when whole toilet turned over and he fell on his  RT side. Seen in ER 03/12/2022 for the back pain and had x-ray of the lumbar spine which showed no fx, some lumbar spondylosis greatest at L5-S1.  Had MRI of lumbar spine done then saw chiropractor who did laser treatments on lower back and RT knee.  No improvement.  Sent to ortho in [place].  Given 4 inj to back about 1 mth ago.  Felt better for a while but now pain back again. Plan for f/u for repeat injections -uses Voltaren gel on the knee which helps some.  Feels he has arthritis in the knee. Working with an Forensic psychologist.  Reports being told that the right knee could not be included in the claim as he did not complain of that at the time he was seen in the emergency room.  Patient states that he was not having pain in the knee pain but it started after.  He is not sure whether he has any swelling in the knee.  HTN: Patient is supposed to be on amlodipine 5 mg daily and Cozaar 50 mg daily. Took meds already for today No device to check BP. Limits salt in foods Patient has history of ascending aortic aneurysm.  Last imaging study for that was done 08/2021.  He had seen endovascular PA in the spring of this year.  Plan is for repeat imaging this month and follow-up with them.  Appointment scheduled for Thursday of  this week.  However patient states he may not be able to make the appointment because he has to work.  HL: Taking and tolerating atorvastatin.  Last LDL 88  CLL: Has upcoming appointment with Dr. Alvy Bimler later this mth.  Currently being observed  Barrett's Esophagus: Last endoscopy I see in the system is December 2017.  He is on Protonix.  He is due for colon cancer screening.  Hypothyroidism: Reports compliance with taking levothyroxine 175 mcg daily.  No palpitations or feeling hot or cold all the time.  No major weight changes.  Overwgh/prediabetes: A1c 6.1 seven months ago.  Admits he drinks sodas and sweet tea.  Eats fruits but not every day  HM: Due for colon cancer screening.  Reports he has received flu shot already at work.  Due for shingles vaccine and COVID booster.  Patient Active Problem List   Diagnosis Date Noted   Prediabetes 02/11/2022   Infective otitis externa of both ears 10/08/2021   Seasonal allergies 10/08/2021   Chronic lymphocytic leukemia ([place]) 09/05/2021   Ascending aortic aneurysm ([place]) 01/16/2021   Hyperlipidemia 12/09/2020   Lipoma of neck 11/24/2019   Class 1 obesity due to excess calories without serious comorbidity with body mass index (BMI) of 31.0 to 31.9 in adult 11/04/2017   Disorder of  left sacroiliac joint 12/21/2016   Spondylosis without myelopathy or radiculopathy, lumbar region 12/21/2016   Lumbar degenerative disc disease 12/21/2016   COPD (chronic obstructive pulmonary disease) with emphysema ([place]) 07/31/2016   HTN (hypertension) 07/31/2016   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 30 tablet 11   diclofenac Sodium (VOLTAREN) 1 % GEL APPLY 4 G TOPICALLY 4 (FOUR) TIMES DAILY. 100 g 5   ibuprofen (ADVIL) 600 MG tablet Take 1 tablet (600 mg total) by mouth once every 6 (six) hours as needed. 30 tablet 0   levothyroxine (SYNTHROID) 175 MCG tablet Take 1 tablet (175 mcg total) by mouth daily. 90 tablet 1   methocarbamol (ROBAXIN) 500 MG tablet Take 1 tablet (500 mg total) by mouth once nightly at bedtime as needed for muscle spasms. 20 tablet 0   neomycin-polymyxin-hydrocortisone (CORTISPORIN) OTIC solution Place 4 drops into both ears 4 (four) times daily. 10 mL 0   ofloxacin (FLOXIN OTIC) 0.3 % OTIC solution Place 5 drops into both ears daily. 10 mL 0   pantoprazole (PROTONIX) 40 MG tablet TAKE 1 TABLET (40 MG TOTAL) BY MOUTH DAILY. 90 tablet 1   tamsulosin (FLOMAX) 0.4 MG CAPS capsule Take 1 capsule (0.4 mg total) by mouth daily. 30 capsule 3   No current facility-administered medications on file prior to visit.    No Known Allergies  Social History   Socioeconomic History   Marital status: Divorced    Spouse name: Not on file   Number of children: 2   Years of education: Not on file   Highest education level: Not on file  Occupational History   Occupation: Associate Professor  Tobacco Use   Smoking status: Former    Packs/day: 1.00    Years: 26.00    Total pack years: 26.00    Types: Cigarettes    Quit date: 09/15/2004    Years since quitting: 18.0   Smokeless tobacco: Never   Tobacco comments:    Quit in 2005  Vaping Use   Vaping Use: Never used  Substance and Sexual Activity   Alcohol use: Yes    Comment: ON WEEKENDS   Drug use: No   Sexual activity: Not Currently    Birth control/protection: None  Other Topics Concern   Not on file  Social History Narrative   Lives alone.     Social Determinants of Health   Financial Resource Strain: Not on file   Food Insecurity: Not on file  Transportation Needs: Not on file  Physical Activity: Not on file  Stress: Not on file  Social Connections: Not on file  Intimate Partner Violence: Not on file    Family History  Problem Relation Age of Onset   Cancer Mother        lung cancer   Lung cancer Mother    Cancer Father        lung   Stroke Father    Heart attack Father 64   Thyroid disease Sister    Cancer Maternal Aunt  stomach   Colon cancer Neg Hx    Esophageal cancer Neg Hx     Past Surgical History:  Procedure Laterality Date   BIOPSY  09/18/2016   Procedure: BIOPSY;  Surgeon: Daneil Dolin, MD;  [facility];  Service: Endoscopy;;  biopsy of distal esophagus   ESOPHAGOGASTRODUODENOSCOPY  2001   barrett's per path   ESOPHAGOGASTRODUODENOSCOPY (EGD) WITH PROPOFOL N/A 09/18/2016   Procedure: ESOPHAGOGASTRODUODENOSCOPY (EGD) WITH PROPOFOL;  Surgeon: Daneil Dolin, MD;  [facility];  Service: Endoscopy;  Laterality: N/A;  [place] WITH ALVEOLOPLASTY  11/30/2011   Procedure: MULTIPLE EXTRACION WITH ALVEOLOPLASTY;  Surgeon: Gae Bon, DDS;  Location: [facility];  Service: Oral Surgery;  Laterality: Bilateral;    ROS: Review of Systems  HENT:  Negative for sore throat.        Ears hearing aid on RT.  Respiratory:  Negative for shortness of breath.   Cardiovascular:  Negative for chest pain.  Gastrointestinal:  Negative for blood in stool.  Genitourinary:  Negative for difficulty urinating.   Negative except as stated above  PHYSICAL EXAM: BP 130/82   Pulse 61   Temp (!) 97.5 F (36.4 C) (Oral)   Ht '5\' 5"'$  (1.651 m)   Wt 180 lb (81.6 kg)   SpO2 97%   BMI 29.95 kg/m   Wt Readings from Last 3 Encounters:  09/15/22 180 lb (81.6 kg)  03/12/22 178 lb (80.7 kg)  03/02/22 178 lb (80.7 kg)    Physical Exam   General appearance - alert, well appearing, and in no distress Mental status -  normal mood, behavior, speech, dress, motor activity, and thought processes Eyes - pupils equal and reactive, extraocular eye movements intact Ears -patient is a little hard of hearing.  He is wearing his hearing aid in the right ear. Nose - normal and patent, no erythema, discharge or polyps Mouth -he has dentures above. Neck - supple, no significant adenopathy Lymphatics - no palpable lymphadenopathy, no hepatosplenomegaly Chest - clear to auscultation, no wheezes, rales or rhonchi, symmetric air entry Heart - normal rate, regular rhythm, normal S1, S2, no murmurs, rubs, clicks or gallops Abdomen - soft, nontender, nondistended, no masses or organomegaly Musculoskeletal -right knee: No significant joint deformity noted.  No swelling noted.  No point tenderness on palpation of the medial and lateral joint line.  However he has moderate discomfort and attempted passive range of motion Extremities - peripheral pulses normal, no pedal edema, no clubbing or cyanosis Skin -no abnormal lesions noted on the scalp     Latest Ref Rng & Units 09/16/2021    3:51 PM 08/13/2021    9:33 AM 01/16/2021    3:13 PM  CMP  Glucose 70 - 99 mg/dL 94  151    BUN 6 - 20 mg/dL 15  15    Creatinine 0.61 - 1.24 mg/dL 0.82  0.83    Sodium 135 - 145 mmol/L 137  137    Potassium 3.5 - 5.1 mmol/L 3.9  4.5    Chloride 98 - 111 mmol/L 102  100    CO2 22 - 32 mmol/L 28  23    Calcium 8.9 - 10.3 mg/dL 9.6  9.3    Total Protein 6.5 - 8.1 g/dL 7.1  6.9  6.6   Total Bilirubin 0.3 - 1.2 mg/dL 0.5  0.4  0.3   Alkaline Phos 38 - 126  U/L 77  99  90   AST 15 - 41 U/L '19  25  20   '$ ALT 0 - 44 U/L 29  38  29    Lipid Panel     Component Value Date/Time   CHOL 157 08/13/2021 0933   TRIG 139 08/13/2021 0933   HDL 44 08/13/2021 0933   CHOLHDL 3.6 08/13/2021 0933   CHOLHDL 4.1 10/31/2013 1633   VLDL 34 10/31/2013 1633   LDLCALC 88 08/13/2021 0933    CBC    Component Value Date/Time   WBC 17.6 (H) 09/16/2021 1551    WBC 9.9 11/23/2016 1331   RBC 5.24 09/16/2021 1551   HGB 14.4 09/16/2021 1551   HGB 15.1 08/13/2021 0933   HCT 42.8 09/16/2021 1551   HCT 44.5 08/13/2021 0933   PLT 196 09/16/2021 1551   PLT 192 08/13/2021 0933   MCV 81.7 09/16/2021 1551   MCV 81 08/13/2021 0933   MCH 27.5 09/16/2021 1551   MCHC 33.6 09/16/2021 1551   RDW 12.8 09/16/2021 1551   RDW 12.9 08/13/2021 0933   LYMPHSABS 11.5 (H) 09/16/2021 1551   LYMPHSABS 12.4 (H) 08/13/2021 0933   MONOABS 0.6 09/16/2021 1551   EOSABS 0.3 09/16/2021 1551   EOSABS 0.2 08/13/2021 0933   BASOSABS 0.1 09/16/2021 1551   BASOSABS 0.1 08/13/2021 0933    ASSESSMENT AND PLAN: 1. Annual physical exam   2. Essential hypertension Close to goal.  Continue amlodipine 5 mg daily and Cozaar 50 mg daily. - Comprehensive metabolic panel - amLODipine (NORVASC) 5 MG tablet; TAKE 1 TABLET (5 MG TOTAL) BY MOUTH DAILY.  Dispense: 90 tablet; Refill: 2 - losartan (COZAAR) 50 MG tablet; Take 1 tablet (50 mg total) by mouth daily. (Must keep upcoming office visit for refills)  Dispense: 90 tablet; Refill: 1  3. Acquired hypothyroidism Continue levothyroxine 175 mcg daily.  Due for TSH check. - TSH  4. Prediabetes Patient advised to eliminate sugary drinks from the diet, cut back on portion sizes especially of white carbohydrates, eat more white lean meat like chicken [place] and seafood instead of beef or pork and incorporate fresh fruits and vegetables into the diet daily.  - Hemoglobin A1c  5. CLL (chronic lymphocytic leukemia) ([place]) Advised to keep upcoming appointment with Dr. Lottie Rater- - CBC With Diff/Platelet  6. Aneurysm of ascending aorta without rupture [facility]) Advised patient that if he is not able to keep the appointment for the CAT scan and vascular surgeon that is scheduled for Thursday of this week, he needs to call them to reschedule.  Discussed the importance of follow-up on this  7. Mixed hyperlipidemia Continue atorvastatin. - Lipid  panel - atorvastatin (LIPITOR) 10 MG tablet; TAKE 1 TABLET (10 MG TOTAL) BY MOUTH DAILY.  Dispense: 90 tablet; Refill: 2  8. Barrett's esophagus without dysplasia Continue Protonix.  He is due for follow-up with gastroenterology.  I suggested referral but patient wants to hold off for now stating that he has too many other appointments coming up and he has to pay a co-pay each time He will continue Protonix.  9. Chronic pain of right knee I recommend getting an imaging study of the knee.  Continue Voltaren gel in the meantime - DG Knee Complete 4 Views Right; Future  10. Chronic midline low back pain, unspecified whether sciatica present Followed by and being managed through orthopedics  11. Overweight (BMI 25.0-29.9) See #4 above.  12. Screening for colon cancer Discussed colon cancer screening methods.  He  prefers to do the fit test again. - Fecal occult blood, imunochemical([facility])  13. Need for shingles vaccine Discussed recommendation for shingles vaccine.  He is agreeable to receiving first Shingrix shot today.     Patient was given the opportunity to ask questions.  Patient verbalized understanding of the plan and was able to repeat key elements of the plan.   This documentation was completed using Radio producer.  Any transcriptional errors are unintentional.  Orders Placed This Encounter  Procedures   Fecal occult blood, imunochemical([facility])   DG Knee Complete 4 Views Right   Varicella-zoster vaccine IM   Lipid panel   Comprehensive metabolic panel   TSH   Hemoglobin A1c   CBC With Diff/Platelet     Requested Prescriptions   Signed Prescriptions Disp Refills   amLODipine (NORVASC) 5 MG tablet 90 tablet 2    Sig: TAKE 1 TABLET (5 MG TOTAL) BY MOUTH DAILY.   atorvastatin (LIPITOR) 10 MG tablet 90 tablet 2    Sig: TAKE 1 TABLET (10 MG TOTAL) BY MOUTH DAILY.   losartan (COZAAR) 50 MG tablet 90 tablet 1    Sig: Take 1  tablet (50 mg total) by mouth daily. (Must keep upcoming office visit for refills)    Return in about 4 months (around 01/15/2023).  Karle Plumber, MD, FACP

## 2022-09-16 LAB — CBC WITH DIFF/PLATELET
Basophils Absolute: 0.1 10*3/uL (ref 0.0–0.2)
Basos: 0 %
EOS (ABSOLUTE): 0.2 10*3/uL (ref 0.0–0.4)
Eos: 1 %
Hematocrit: 45.7 % (ref 37.5–51.0)
Hemoglobin: 15.5 g/dL (ref 13.0–17.7)
Immature Grans (Abs): 0 10*3/uL (ref 0.0–0.1)
Immature Granulocytes: 0 %
Lymphocytes Absolute: 12.9 10*3/uL — ABNORMAL HIGH (ref 0.7–3.1)
Lymphs: 73 %
MCH: 27.9 pg (ref 26.6–33.0)
MCHC: 33.9 g/dL (ref 31.5–35.7)
MCV: 82 fL (ref 79–97)
Monocytes Absolute: 0.4 10*3/uL (ref 0.1–0.9)
Monocytes: 2 %
Neutrophils Absolute: 4.3 10*3/uL (ref 1.4–7.0)
Neutrophils: 24 %
Platelets: 182 10*3/uL (ref 150–450)
RBC: 5.56 x10E6/uL (ref 4.14–5.80)
RDW: 12.8 % (ref 11.6–15.4)
WBC: 17.9 10*3/uL — ABNORMAL HIGH (ref 3.4–10.8)

## 2022-09-16 LAB — COMPREHENSIVE METABOLIC PANEL
ALT: 32 IU/L (ref 0–44)
AST: 22 IU/L (ref 0–40)
Albumin/Globulin Ratio: 2.3 — ABNORMAL HIGH (ref 1.2–2.2)
Albumin: 4.8 g/dL (ref 3.9–4.9)
Alkaline Phosphatase: 85 IU/L (ref 44–121)
BUN/Creatinine Ratio: 18 (ref 10–24)
BUN: 13 mg/dL (ref 8–27)
Bilirubin Total: 0.4 mg/dL (ref 0.0–1.2)
CO2: 24 mmol/L (ref 20–29)
Calcium: 9.5 mg/dL (ref 8.6–10.2)
Chloride: 99 mmol/L (ref 96–106)
Creatinine, Ser: 0.73 mg/dL — ABNORMAL LOW (ref 0.76–1.27)
Globulin, Total: 2.1 g/dL (ref 1.5–4.5)
Glucose: 106 mg/dL — ABNORMAL HIGH (ref 70–99)
Potassium: 4.4 mmol/L (ref 3.5–5.2)
Sodium: 139 mmol/L (ref 134–144)
Total Protein: 6.9 g/dL (ref 6.0–8.5)
eGFR: 104 mL/min/{1.73_m2} (ref >59)

## 2022-09-16 LAB — LIPID PANEL
Chol/HDL Ratio: 2.9 ratio (ref 0.0–5.0)
Cholesterol, Total: 150 mg/dL (ref 100–199)
HDL: 52 mg/dL (ref >39)
LDL Chol Calc (NIH): 80 mg/dL (ref 0–99)
Triglycerides: 95 mg/dL (ref 0–149)
VLDL Cholesterol Cal: 18 mg/dL (ref 5–40)

## 2022-09-16 LAB — FECAL OCCULT BLOOD, IMMUNOCHEMICAL: Fecal Occult Bld: NEGATIVE

## 2022-09-16 LAB — HEMOGLOBIN A1C
Est. average glucose Bld gHb Est-mCnc: 128 mg/dL
Hgb A1c MFr Bld: 6.1 % — ABNORMAL HIGH (ref 4.8–5.6)

## 2022-09-16 LAB — TSH: TSH: 0.864 u[IU]/mL (ref 0.450–4.500)

## 2022-09-17 ENCOUNTER — Ambulatory Visit: Payer: BC Managed Care – PPO

## 2022-09-17 ENCOUNTER — Inpatient Hospital Stay: Admission: RE | Admit: 2022-09-17 | Payer: BC Managed Care – PPO | Source: Ambulatory Visit

## 2022-09-18 ENCOUNTER — Ambulatory Visit: Payer: Self-pay | Admitting: Hematology and Oncology

## 2022-09-18 ENCOUNTER — Other Ambulatory Visit: Payer: Self-pay

## 2022-09-23 ENCOUNTER — Telehealth: Payer: Self-pay | Admitting: Internal Medicine

## 2022-09-23 ENCOUNTER — Other Ambulatory Visit: Payer: Self-pay

## 2022-09-23 ENCOUNTER — Other Ambulatory Visit: Payer: Self-pay | Admitting: Internal Medicine

## 2022-09-23 DIAGNOSIS — K219 Gastro-esophageal reflux disease without esophagitis: Secondary | ICD-10-CM

## 2022-09-23 MED ORDER — PANTOPRAZOLE SODIUM 40 MG PO TBEC
DELAYED_RELEASE_TABLET | Freq: Every day | ORAL | 2 refills | Status: DC
  Filled 2022-09-23: qty 30, 30d supply, fill #0
  Filled 2022-10-21: qty 30, 30d supply, fill #1
  Filled 2022-11-24: qty 30, 30d supply, fill #2

## 2022-09-23 NOTE — Telephone Encounter (Signed)
Pt was advised by Pharmacy to call office to rush refill for pantoprazole (PROTONIX) 40 MG tablet [[id number]]  / please send to [facility]

## 2022-09-23 NOTE — Telephone Encounter (Signed)
Called and spoke to the patient. Informed to take Extra Strength Tylenol, one tablet 3 time a day as needed. Patient expressed verbal understanding.

## 2022-09-23 NOTE — Telephone Encounter (Signed)
Patient states he spoke with someone about his results and not sure why the practice is reaching out to him. Chart reflects a letter was sent to the patient today. Confirmed patient telephone number and its accurate.

## 2022-09-23 NOTE — Telephone Encounter (Signed)
Patient called back and I spoke with the patient. Informed of results. Patient expressed verbal understanding. Patient inquired about pain in the knee and he stated that he is currently unable to see a specialist due to not being able to afford it. [place] would like to know if there is anything else he can do or take to help with the pain. Please advise.

## 2022-09-24 ENCOUNTER — Other Ambulatory Visit (HOSPITAL_BASED_OUTPATIENT_CLINIC_OR_DEPARTMENT_OTHER): Payer: Self-pay

## 2022-09-24 ENCOUNTER — Other Ambulatory Visit: Payer: Self-pay

## 2022-09-25 ENCOUNTER — Ambulatory Visit: Payer: Self-pay | Admitting: Hematology and Oncology

## 2022-09-25 ENCOUNTER — Other Ambulatory Visit: Payer: Self-pay

## 2022-10-08 ENCOUNTER — Other Ambulatory Visit: Payer: Self-pay

## 2022-10-12 ENCOUNTER — Other Ambulatory Visit: Payer: Self-pay

## 2022-10-21 ENCOUNTER — Other Ambulatory Visit: Payer: Self-pay

## 2022-10-22 ENCOUNTER — Ambulatory Visit: Payer: BC Managed Care – PPO

## 2022-10-22 ENCOUNTER — Inpatient Hospital Stay: Admission: RE | Admit: 2022-10-22 | Payer: BC Managed Care – PPO | Source: Ambulatory Visit

## 2022-11-10 ENCOUNTER — Other Ambulatory Visit: Payer: Self-pay

## 2022-11-13 ENCOUNTER — Other Ambulatory Visit: Payer: Self-pay

## 2022-11-18 ENCOUNTER — Telehealth: Payer: Self-pay | Admitting: Internal Medicine

## 2022-11-18 NOTE — Telephone Encounter (Signed)
Patient will need to specify the medication needed. I cannot find any patches for pain in his medication history.

## 2022-11-18 NOTE — Telephone Encounter (Signed)
Patient says it's okay to leave a voicemail or send a text message in regards to his patches.

## 2022-11-18 NOTE — Telephone Encounter (Signed)
Patient is calling in to see if Dr. Wynetta Emery can send the patches for his back to [place]. Patient says he hasn't been seen in a while, but those are what works for him. Please follow up with patient.

## 2022-11-23 NOTE — Telephone Encounter (Signed)
He called back and is asking for the medicated LidoPro Patches without adhesive bc they pull his hairs out.

## 2022-11-24 ENCOUNTER — Other Ambulatory Visit: Payer: Self-pay

## 2022-11-25 NOTE — Telephone Encounter (Signed)
Called & LVM to call back to verify reason for request of patches & best pharmacy

## 2022-11-25 NOTE — Telephone Encounter (Signed)
[place] friend. I'm sorry to have to route this to you. This patient is requesting a rx for the OTC lidocaine patches. I can't find a previous rx for this so I cannot reorder per protocol. Will you consider rx'ing for him? I'm not aware of this patient's pain management plan or the appropriateness of this medication for him. I wanted to make you aware of this request.

## 2022-11-27 ENCOUNTER — Other Ambulatory Visit: Payer: Self-pay

## 2022-11-27 MED ORDER — LIDOCAINE 5 % EX PTCH
1.0000 | MEDICATED_PATCH | CUTANEOUS | 1 refills | Status: DC
  Filled 2022-11-27: qty 30, 30d supply, fill #0
  Filled 2022-12-11 – 2022-12-25 (×2): qty 30, 30d supply, fill #1

## 2022-11-27 NOTE — Telephone Encounter (Signed)
Called & LVM to call back to discuss further.

## 2022-11-27 NOTE — Telephone Encounter (Signed)
Patient called back. Patient stated that he previously received Lidocaine patches from [place] and currently needs mores for his lower back. The best pharmacy is [facility]

## 2022-11-27 NOTE — Addendum Note (Signed)
Addended by: Karle Plumber B on: 11/27/2022 01:07 PM   Modules accepted: Orders

## 2022-12-09 ENCOUNTER — Other Ambulatory Visit: Payer: Self-pay

## 2022-12-11 ENCOUNTER — Other Ambulatory Visit: Payer: Self-pay

## 2022-12-11 ENCOUNTER — Telehealth: Payer: Self-pay | Admitting: Internal Medicine

## 2022-12-11 NOTE — Telephone Encounter (Signed)
Routing to pcp for review 

## 2022-12-11 NOTE — Telephone Encounter (Signed)
lidocaine (LIDODERM) 5 % [id number] Only had 30 patches / but pt uses 2 a day / he uses 1 on his knee and 1 on his back / his Rx ran out early and he can not get refill that the pharmacy has until end of March / pt was advised to call PCP / pt stated he was going to the pharmacy today and asked if this can be refilled today / please advise

## 2022-12-15 NOTE — Telephone Encounter (Signed)
Spoke with patient patient understands that lidocaine (LIDODERM) 5 %  is only to be use for his back per PCP.  Medication refill sent to pharmacy with instruction of use. Advised patient that we can schedule office visit for knee pain. Patient voiced that he would get an appointment later.

## 2022-12-16 ENCOUNTER — Other Ambulatory Visit: Payer: Self-pay

## 2022-12-21 ENCOUNTER — Other Ambulatory Visit: Payer: Self-pay

## 2022-12-21 ENCOUNTER — Other Ambulatory Visit: Payer: Self-pay | Admitting: Internal Medicine

## 2022-12-21 DIAGNOSIS — K219 Gastro-esophageal reflux disease without esophagitis: Secondary | ICD-10-CM

## 2022-12-22 ENCOUNTER — Other Ambulatory Visit: Payer: Self-pay

## 2022-12-22 MED ORDER — PANTOPRAZOLE SODIUM 40 MG PO TBEC
40.0000 mg | DELAYED_RELEASE_TABLET | Freq: Every day | ORAL | 0 refills | Status: DC
  Filled 2022-12-22: qty 30, 30d supply, fill #0

## 2022-12-25 ENCOUNTER — Other Ambulatory Visit: Payer: Self-pay

## 2022-12-30 ENCOUNTER — Other Ambulatory Visit: Payer: Self-pay

## 2022-12-30 ENCOUNTER — Ambulatory Visit: Payer: Self-pay

## 2022-12-30 DIAGNOSIS — J302 Other seasonal allergic rhinitis: Secondary | ICD-10-CM

## 2022-12-30 MED ORDER — CETIRIZINE HCL 10 MG PO TABS
10.0000 mg | ORAL_TABLET | Freq: Every day | ORAL | 0 refills | Status: AC
  Filled 2022-12-30: qty 90, 90d supply, fill #0

## 2022-12-30 NOTE — Telephone Encounter (Signed)
  Chief Complaint: sinus congestion Symptoms: sinus drainage, cough, sneezing, HA Frequency: Monday Pertinent Negatives: NA Disposition: [] ED /[] Urgent Care (no appt availability in office) / [] Appointment(In office/virtual)/ []  [facility]/ [x] Home Care/ [] Refused Recommended Disposition /[] [facility]/ []  Follow-up with PCP Additional Notes: pt wanting to know if he can take the Coricidin more than just at night, looked up on website and states can take 1 tab q6h prn. Also advised pt to start taking Zyrtec for now with allergy season. Advised him I would send in to pharmacy for him. Pt will check with pharmacy tomorrow to see if ready for pick up and how much it costs. Advised if no improvement next week to call back and schedule appt. Pt verbalized understanding.   Summary: OTC medication questions   Sinus drainage, coughing, sneezing, slight headache  (started monday)  , Questions regarding otc Coricidans medication. I took it last night, can I take right now?     Reason for Disposition  [1] Sinus congestion as part of a cold AND [2] present < 10 days  Answer Assessment - Initial Assessment Questions 2. ONSET: "When did the sinus pain start?"  (e.g., hours, days)      Monday  3. SEVERITY: "How bad is the pain?"   (Scale 1-10; mild, moderate or severe)   - MILD (1-3): doesn't interfere with normal activities    - MODERATE (4-7): interferes with normal activities (e.g., work or school) or awakens from sleep   - SEVERE (8-10): excruciating pain and patient unable to do any normal activities        Mild  5. NASAL CONGESTION: "Is the nose blocked?" If Yes, ask: "Can you open it or must you breathe through your mouth?"     yes 8. OTHER SYMPTOMS: "Do you have any other symptoms?" (e.g., sore throat, cough, earache, difficulty breathing)     Cough, sneezing, HA, sinus drainage  Protocols used: Sinus Pain or Congestion-A-AH

## 2022-12-30 NOTE — Telephone Encounter (Signed)
Requested Prescriptions  Pending Prescriptions Disp Refills   cetirizine (ZYRTEC) 10 MG tablet 90 tablet 0    Sig: Take 1 tablet (10 mg total) by mouth daily.     Ear, Nose, and Throat:  Antihistamines 2 Failed - 12/30/2022  4:17 PM      Failed - Cr in normal range and within 360 days    Creatinine  Date Value Ref Range Status  09/16/2021 0.82 0.61 - 1.24 mg/dL Final   Creat  Date Value Ref Range Status  07/09/2016 0.79 0.70 - 1.33 mg/dL Final    Comment:      For patients > or = 62 years of age: The upper reference limit for Creatinine is approximately 13% higher for people identified as African-American.      Creatinine, Ser  Date Value Ref Range Status  09/15/2022 0.73 (L) 0.76 - 1.27 mg/dL Final         Passed - Valid encounter within last 12 months    Recent Outpatient Visits           3 months ago Annual physical exam   [place], [place]   9 months ago Lumbar strain, subsequent encounter   [place] Ladell Pier, MD   10 months ago Essential hypertension   [place], [place]   1 year ago Essential hypertension   [place], Dionne Bucy, [place]   1 year ago Essential hypertension   [place], [place]       Future Appointments             In 2 weeks Ladell Pier, MD [place]

## 2023-01-13 ENCOUNTER — Other Ambulatory Visit: Payer: Self-pay

## 2023-01-14 ENCOUNTER — Other Ambulatory Visit: Payer: Self-pay

## 2023-01-18 ENCOUNTER — Other Ambulatory Visit: Payer: Self-pay

## 2023-01-18 ENCOUNTER — Ambulatory Visit: Payer: BC Managed Care – PPO | Admitting: Nurse Practitioner

## 2023-01-18 ENCOUNTER — Encounter: Payer: Self-pay | Admitting: Internal Medicine

## 2023-01-18 ENCOUNTER — Ambulatory Visit: Payer: BC Managed Care – PPO | Attending: Internal Medicine | Admitting: Internal Medicine

## 2023-01-18 VITALS — BP 141/81 | HR 65 | Temp 98.1°F | Ht 65.0 in | Wt 178.0 lb

## 2023-01-18 DIAGNOSIS — Z01818 Encounter for other preprocedural examination: Secondary | ICD-10-CM | POA: Diagnosis not present

## 2023-01-18 DIAGNOSIS — K219 Gastro-esophageal reflux disease without esophagitis: Secondary | ICD-10-CM

## 2023-01-18 DIAGNOSIS — I10 Essential (primary) hypertension: Secondary | ICD-10-CM

## 2023-01-18 DIAGNOSIS — E039 Hypothyroidism, unspecified: Secondary | ICD-10-CM | POA: Diagnosis not present

## 2023-01-18 DIAGNOSIS — I7121 Aneurysm of the ascending aorta, without rupture: Secondary | ICD-10-CM | POA: Diagnosis not present

## 2023-01-18 DIAGNOSIS — R7303 Prediabetes: Secondary | ICD-10-CM | POA: Insufficient documentation

## 2023-01-18 DIAGNOSIS — R079 Chest pain, unspecified: Secondary | ICD-10-CM

## 2023-01-18 DIAGNOSIS — E782 Mixed hyperlipidemia: Secondary | ICD-10-CM

## 2023-01-18 DIAGNOSIS — C9110 Chronic lymphocytic leukemia of B-cell type not having achieved remission: Secondary | ICD-10-CM | POA: Insufficient documentation

## 2023-01-18 MED ORDER — LOSARTAN POTASSIUM 50 MG PO TABS
50.0000 mg | ORAL_TABLET | Freq: Every day | ORAL | 1 refills | Status: DC
Start: 2023-01-18 — End: 2023-06-03
  Filled 2023-01-18 – 2023-02-10 (×2): qty 90, 90d supply, fill #0
  Filled 2023-05-19: qty 30, 30d supply, fill #1
  Filled 2023-05-24: qty 90, 90d supply, fill #1

## 2023-01-18 MED ORDER — AMLODIPINE BESYLATE 10 MG PO TABS
10.0000 mg | ORAL_TABLET | Freq: Every day | ORAL | 1 refills | Status: DC
Start: 2023-01-18 — End: 2023-06-03
  Filled 2023-01-18: qty 90, 90d supply, fill #0
  Filled 2023-05-19 – 2023-05-24 (×2): qty 90, 90d supply, fill #1

## 2023-01-18 MED ORDER — LEVOTHYROXINE SODIUM 175 MCG PO TABS
175.0000 ug | ORAL_TABLET | Freq: Every day | ORAL | 1 refills | Status: DC
Start: 2023-01-18 — End: 2023-01-20
  Filled 2023-01-18: qty 90, 90d supply, fill #0

## 2023-01-18 MED ORDER — PANTOPRAZOLE SODIUM 40 MG PO TBEC
40.0000 mg | DELAYED_RELEASE_TABLET | Freq: Every day | ORAL | 3 refills | Status: DC
Start: 2023-01-18 — End: 2023-06-07
  Filled 2023-01-18: qty 30, 30d supply, fill #0
  Filled 2023-02-26: qty 30, 30d supply, fill #1
  Filled 2023-04-02: qty 30, 30d supply, fill #2
  Filled 2023-04-02 (×2): qty 30, 30d supply, fill #0
  Filled 2023-04-27: qty 30, 30d supply, fill #1

## 2023-01-18 MED ORDER — DICLOFENAC SODIUM 1 % EX GEL
4.0000 g | Freq: Four times a day (QID) | CUTANEOUS | 1 refills | Status: DC
  Filled 2023-01-18: qty 100, 7d supply, fill #0
  Filled 2023-05-19 – 2023-05-24 (×2): qty 100, 7d supply, fill #1

## 2023-01-18 NOTE — Progress Notes (Signed)
Patient ID: Darren [name], male    DOB: 02-17-1961  MRN: [id number]  CC: Pre-op Exam (Pre-op exam. Med refill - ibuprofen)   Subjective: Darren [name] is a 62 y.o. male who presents for pre-op eval His concerns today include:  Pt with hx of GERD, hypothyroid, HTN, HL, centrilobular emphysema, CLL followed by Dr. Lyna Poser, ascending aortic aneurysm (followed by vascular), chronic LBP and Barrett's esophagus.   Hx of COVID infection.   Pt states [facility] in [place] has recommended surgery on his lower back.  Pt states date has not been set as yet.  Told to have check up first.  He is working with an attorney concerning his back injury from summer of last year.  Pt states he would rather not have the surgery but thinks if he decides not to, it may mess his case up.  Feels his back will not be better with surgery and concern about being out of work for 3-4 days after surgery; can not afford to be out of work at all. -Request RF on Ibuprofen.  Helps more than Tylenol  HTN: Patient is supposed to be on amlodipine 5 mg daily and Cozaar 50 mg daily. Took meds already for today.  No device to check BP Patient has history of ascending aortic aneurysm. Last imaging study for that was done 08/2021.  He was supposed to follow-up with vascular in December but did not go due to his work schedule and unable to afford co-pay at the time. Taking and tolerating atorvastatin.  Last LDL was 80. No CP.  Occasional SOB; not on any inhaler. Job fairly active.  Does a lot of bending, lifting.  However, reports LT side CP below LT breast while at work last wk.  Lastes 1-2 hrs.  No radiation.  Went away with Tylenol.  Has not done much walking in a while due to chronic back issues.  Quit smoking 2005 -no major surgeries in the past  CLL: Followed by Dr. Lyna Poser.  WBC has remained stable around 17,000.  Thinks he Missed last appt.      Hypothyroidism: Reports compliance with taking levothyroxine 175 mcg daily.  No  palpitations.  Last TSH was 0.86 in December.  PreDM/Obesity: He feels he does okay with his eating habits.  Not getting in much exercise besides what he does at work. Lab Results  Component Value Date   HGBA1C 6.1 (H) 09/15/2022    Patient Active Problem List   Diagnosis Date Noted   Prediabetes 02/11/2022   Infective otitis externa of both ears 10/08/2021   Seasonal allergies 10/08/2021   Chronic lymphocytic leukemia 09/05/2021   Ascending aortic aneurysm 01/16/2021   Hyperlipidemia 12/09/2020   Lipoma of neck 11/24/2019   Class 1 obesity due to excess calories without serious comorbidity with body mass index (BMI) of 31.0 to 31.9 in adult 11/04/2017   Disorder of left sacroiliac joint 12/21/2016   Spondylosis without myelopathy or radiculopathy, lumbar region 12/21/2016   Lumbar degenerative disc disease 12/21/2016   COPD (chronic obstructive pulmonary disease) with emphysema 07/31/2016   HTN (hypertension) 07/31/2016   Dilation of thoracic aorta 07/31/2016   Esophageal dysphagia 07/09/2016   Chronic midline low back pain 07/09/2016   Sensorineural hearing loss (SNHL), bilateral 03/30/2016   Vitamin D deficiency 04/06/2014   Hypothyroidism 10/31/2013   GERD (gastroesophageal reflux disease) 11/25/2011   Barrett esophagus 09/29/1999     Current Outpatient Medications on File Prior to Visit  Medication Sig Dispense  Refill   atorvastatin (LIPITOR) 10 MG tablet Take 1 tablet (10 mg total) by mouth daily. 90 tablet 2   ibuprofen (ADVIL) 600 MG tablet Take 1 tablet (600 mg total) by mouth once every 6 (six) hours as needed. 30 tablet 0   lidocaine (LIDODERM) 5 % Place 1 patch onto the skin daily. Leave patch on for 12 hrs daily. 30 patch 1   methocarbamol (ROBAXIN) 500 MG tablet Take 1 tablet (500 mg total) by mouth once nightly at bedtime as needed for muscle spasms. 20 tablet 0   neomycin-polymyxin-hydrocortisone (CORTISPORIN) OTIC solution Place 4 drops into both ears 4 (four)  times daily. 10 mL 0   ofloxacin (FLOXIN OTIC) 0.3 % OTIC solution Place 5 drops into both ears daily. 10 mL 0   tamsulosin (FLOMAX) 0.4 MG CAPS capsule Take 1 capsule (0.4 mg total) by mouth daily. 30 capsule 3   cetirizine (ZYRTEC) 10 MG tablet Take 1 tablet (10 mg total) by mouth daily. (Patient not taking: Reported on 01/18/2023) 90 tablet 0   No current facility-administered medications on file prior to visit.    No Known Allergies  Social History   Socioeconomic History   Marital status: Divorced    Spouse name: Not on file   Number of children: 2   Years of education: Not on file   Highest education level: Not on file  Occupational History   Occupation: Administrator, [employer]  Tobacco Use   Smoking status: Former    Packs/day: 1.00    Years: 26.00    Additional pack years: 0.00    Total pack years: 26.00    Types: Cigarettes    Quit date: 09/15/2004    Years since quitting: 18.3   Smokeless tobacco: Never   Tobacco comments:    Quit in 2005  Vaping Use   Vaping Use: Never used  Substance and Sexual Activity   Alcohol use: Yes    Comment: ON WEEKENDS   Drug use: No   Sexual activity: Not Currently    Birth control/protection: None  Other Topics Concern   Not on file  Social History Narrative   Lives alone.     Social Determinants of Health   Financial Resource Strain: Not on file  Food Insecurity: Not on file  Transportation Needs: Not on file  Physical Activity: Not on file  Stress: Not on file  Social Connections: Not on file  Intimate Partner Violence: Not on file    Family History  Problem Relation Age of Onset   Cancer Mother        lung cancer   Lung cancer Mother    Cancer Father        lung   Stroke Father    Heart attack Father 73   Thyroid disease Sister    Cancer Maternal Aunt        stomach   Colon cancer Neg Hx    Esophageal cancer Neg Hx     Past Surgical History:  Procedure Laterality Date   BIOPSY  09/18/2016   Procedure:  BIOPSY;  Surgeon: Corbin Ade, MD;  Location: [facility];  Service: Endoscopy;;  biopsy of distal esophagus   ESOPHAGOGASTRODUODENOSCOPY  2001   barrett's per path   ESOPHAGOGASTRODUODENOSCOPY (EGD) WITH PROPOFOL N/A 09/18/2016   Procedure: ESOPHAGOGASTRODUODENOSCOPY (EGD) WITH PROPOFOL;  Surgeon: Corbin Ade, MD;  Location: [facility];  Service: Endoscopy;  Laterality: N/A;  1200   HEMORRHOID SURGERY     1982  MULTIPLE EXTRACTIONS WITH ALVEOLOPLASTY  11/30/2011   Procedure: MULTIPLE EXTRACION WITH ALVEOLOPLASTY;  Surgeon: Georgia Lopes, DDS;  Location: [facility] OR;  Service: Oral Surgery;  Laterality: Bilateral;    ROS: Review of Systems Negative except as stated above  PHYSICAL EXAM: BP (!) 141/81 (BP Location: Left Arm, Patient Position: Sitting, Cuff Size: Normal)   Pulse 65   Temp 98.1 F (36.7 C) (Oral)   Ht  (1.651 m)   Wt 178 lb (80.7 kg)   SpO2 96%   BMI 29.62 kg/m   Wt Readings from Last 3 Encounters:  01/18/23 178 lb (80.7 kg)  09/15/22 180 lb (81.6 kg)  03/12/22 178 lb (80.7 kg)    Physical Exam   General appearance - alert, well appearing, obese older Caucasian male and in no distress.  Patient is a little hard of hearing.  I have to speak in a loud voice. Mental status - normal mood, behavior, speech, dress, motor activity, and thought processes.  He is tangential at times and has to be redirected Neck - supple, no significant adenopathy Chest - clear to auscultation, no wheezes, rales or rhonchi, symmetric air entry Heart - normal rate, regular rhythm, normal S1, S2, no murmurs, rubs, clicks or gallops Abdomen - soft, nontender, nondistended, no masses or organomegaly Extremities - peripheral pulses normal, no pedal edema, no clubbing or cyanosis     Latest Ref Rng & Units 09/15/2022   11:05 AM 09/16/2021    3:51 PM 08/13/2021    9:33 AM  CMP  Glucose 70 - 99 mg/dL 865  94  784   BUN 8 - 27 mg/dL Creatinine 0.76 - 1.27 mg/dL  6.96  2.95  2.84   Sodium 134 - 144 mmol/L 139  137  137   Potassium 3.5 - 5.2 mmol/L 4.4  3.9  4.5   Chloride 96 - 106 mmol/L 99  102  100   CO2 20 - 29 mmol/L Calcium 8.6 - 10.2 mg/dL 9.5  9.6  9.3   Total Protein 6.0 - 8.5 g/dL 6.9  7.1  6.9   Total Bilirubin 0.0 - 1.2 mg/dL 0.4  0.5  0.4   Alkaline Phos 44 - 121 IU/L 85  77  99   AST 0 - 40 IU/L ALT 0 - 44 IU/L 32  29  38    Lipid Panel     Component Value Date/Time   CHOL 150 09/15/2022 1105   TRIG 95 09/15/2022 1105   HDL 52 09/15/2022 1105   CHOLHDL 2.9 09/15/2022 1105   CHOLHDL 4.1 10/31/2013 1633   VLDL 34 10/31/2013 1633   LDLCALC 80 09/15/2022 1105    CBC    Component Value Date/Time   WBC 17.9 (H) 09/15/2022 1105   WBC 17.6 (H) 09/16/2021 1551   WBC 9.9 11/23/2016 1331   RBC 5.56 09/15/2022 1105   RBC 5.24 09/16/2021 1551   HGB 15.5 09/15/2022 1105   HCT 45.7 09/15/2022 1105   PLT 182 09/15/2022 1105   MCV 82 09/15/2022 1105   MCH 27.9 09/15/2022 1105   MCH 27.5 09/16/2021 1551   MCHC 33.9 09/15/2022 1105   MCHC 33.6 09/16/2021 1551   RDW 12.8 09/15/2022 1105   LYMPHSABS 12.9 (H) 09/15/2022 1105   MONOABS 0.6 09/16/2021 1551   EOSABS 0.2 09/15/2022 1105   BASOSABS 0.1 09/15/2022 1105  EKG: Sinus bradycardia with rate of 53.  Good R wave progression.  Slight LVH.  No ischemic changes.  ASSESSMENT AND PLAN: 1. Preoperative examination I would like to try to get patient in with his oncologist and cardiology prior to surgery. Advised patient that if he is not comfortable proceeding with surgery, he should let the orthopedics specialist know in which case they would have to focus on symptoms control through chronic pain management. - EKG 12-Lead  2. Essential hypertension Not at goal.  Increase Norvasc to 10 mg daily. - amLODipine (NORVASC) 10 MG tablet; Take 1 tablet (10 mg total) by mouth daily.  Dispense: 90 tablet; Refill: 1 - losartan (COZAAR) 50 MG tablet; Take 1 tablet  (50 mg total) by mouth daily. (Must keep upcoming office visit for refills)  Dispense: 90 tablet; Refill: 1  3. Acquired hypothyroidism Continue levothyroxine. - TSH - levothyroxine (SYNTHROID) 175 MCG tablet; Take 1 tablet (175 mcg total) by mouth daily.  Dispense: 90 tablet; Refill: 1  4. Aneurysm of ascending aorta without rupture We will try to get him back in for follow-up with cardiothoracic surgeon. - Ambulatory referral to Cardiothoracic Surgery  5. Mixed hyperlipidemia Continue atorvastatin 10 mg daily  6. CLL (chronic lymphocytic leukemia) Will get him back in with Dr. Lyna Poser.  CBC has remained stable.  No lymphadenopathy on exam. - Ambulatory referral to Hematology / Oncology - CBC With Diff/Platelet  7. Chest pain in adult - EKG 12-Lead - Ambulatory referral to Cardiology  8. Prediabetes Discussed and encourage healthy eating habits. - Hemoglobin A1c  9. Gastroesophageal reflux disease without esophagitis - pantoprazole (PROTONIX) 40 MG tablet; Take 1 tablet (40 mg total) by mouth daily.  Dispense: 30 tablet; Refill: 3   Patient was given the opportunity to ask questions.  Patient verbalized understanding of the plan and was able to repeat key elements of the plan.   This documentation was completed using Paediatric nurse.  Any transcriptional errors are unintentional.  Orders Placed This Encounter  Procedures   TSH   Hemoglobin A1c   CBC With Diff/Platelet   Ambulatory referral to Cardiology   Ambulatory referral to Hematology / Oncology   Ambulatory referral to Cardiothoracic Surgery   EKG 12-Lead     Requested Prescriptions   Signed Prescriptions Disp Refills   diclofenac Sodium (VOLTAREN) 1 % GEL 100 g 1    Sig: APPLY 4 G TOPICALLY 4 (FOUR) TIMES DAILY.   pantoprazole (PROTONIX) 40 MG tablet 30 tablet 3    Sig: Take 1 tablet (40 mg total) by mouth daily.   amLODipine (NORVASC) 10 MG tablet 90 tablet 1    Sig: Take 1 tablet (10  mg total) by mouth daily.   levothyroxine (SYNTHROID) 175 MCG tablet 90 tablet 1    Sig: Take 1 tablet (175 mcg total) by mouth daily.   losartan (COZAAR) 50 MG tablet 90 tablet 1    Sig: Take 1 tablet (50 mg total) by mouth daily. (Must keep upcoming office visit for refills)    Return in about 6 weeks (around 03/01/2023).  Jonah Blue, MD, FACP

## 2023-01-18 NOTE — Patient Instructions (Addendum)
I recommend that you follow-up with your blood specialist Dr. Bertis Ruddy and your cardiothoracic surgeon because of your history of aortic aneurysm prior to your back surgery.  I have submitted those referrals for you.   We will also refer you to cardiology for evaluation of chest pain.

## 2023-01-19 ENCOUNTER — Telehealth: Payer: Self-pay | Admitting: Hematology and Oncology

## 2023-01-19 LAB — CBC WITH DIFF/PLATELET
Basophils Absolute: 0.1 10*3/uL (ref 0.0–0.2)
Basos: 1 %
EOS (ABSOLUTE): 0.2 10*3/uL (ref 0.0–0.4)
Eos: 1 %
Hematocrit: 44.7 % (ref 37.5–51.0)
Hemoglobin: 15 g/dL (ref 13.0–17.7)
Immature Grans (Abs): 0 10*3/uL (ref 0.0–0.1)
Immature Granulocytes: 0 %
Lymphocytes Absolute: 12.5 10*3/uL — ABNORMAL HIGH (ref 0.7–3.1)
Lymphs: 70 %
MCH: 27.5 pg (ref 26.6–33.0)
MCHC: 33.6 g/dL (ref 31.5–35.7)
MCV: 82 fL (ref 79–97)
Monocytes Absolute: 0.4 10*3/uL (ref 0.1–0.9)
Monocytes: 3 %
Neutrophils Absolute: 4.5 10*3/uL (ref 1.4–7.0)
Neutrophils: 25 %
Platelets: 178 10*3/uL (ref 150–450)
RBC: 5.46 x10E6/uL (ref 4.14–5.80)
RDW: 13 % (ref 11.6–15.4)
WBC: 17.6 10*3/uL — ABNORMAL HIGH (ref 3.4–10.8)

## 2023-01-19 LAB — HEMOGLOBIN A1C
Est. average glucose Bld gHb Est-mCnc: 123 mg/dL
Hgb A1c MFr Bld: 5.9 % — ABNORMAL HIGH (ref 4.8–5.6)

## 2023-01-19 LAB — TSH: TSH: 0.428 u[IU]/mL — ABNORMAL LOW (ref 0.450–4.500)

## 2023-01-19 NOTE — Telephone Encounter (Signed)
Left patient a vm regarding upcoming appointment  

## 2023-01-20 ENCOUNTER — Other Ambulatory Visit: Payer: Self-pay | Admitting: Internal Medicine

## 2023-01-20 ENCOUNTER — Other Ambulatory Visit: Payer: Self-pay

## 2023-01-20 DIAGNOSIS — E039 Hypothyroidism, unspecified: Secondary | ICD-10-CM

## 2023-01-20 MED ORDER — LEVOTHYROXINE SODIUM 150 MCG PO TABS
150.0000 ug | ORAL_TABLET | Freq: Every day | ORAL | 1 refills | Status: DC
Start: 2023-01-20 — End: 2023-06-23
  Filled 2023-01-20: qty 90, 90d supply, fill #0
  Filled 2023-04-21: qty 30, 30d supply, fill #1
  Filled 2023-05-19 – 2023-05-24 (×2): qty 30, 30d supply, fill #2
  Filled 2023-06-23 (×2): qty 30, 30d supply, fill #3

## 2023-01-22 ENCOUNTER — Other Ambulatory Visit: Payer: Self-pay

## 2023-02-04 ENCOUNTER — Other Ambulatory Visit: Payer: Self-pay | Admitting: Pharmacist

## 2023-02-04 ENCOUNTER — Other Ambulatory Visit: Payer: Self-pay | Admitting: Internal Medicine

## 2023-02-04 ENCOUNTER — Other Ambulatory Visit: Payer: Self-pay

## 2023-02-04 MED ORDER — LIDOCAINE 5 % EX PTCH
1.0000 | MEDICATED_PATCH | CUTANEOUS | 1 refills | Status: DC
  Filled 2023-02-04: qty 30, 30d supply, fill #0
  Filled 2023-02-26 – 2023-03-01 (×2): qty 30, 30d supply, fill #1

## 2023-02-05 ENCOUNTER — Other Ambulatory Visit: Payer: Self-pay

## 2023-02-08 ENCOUNTER — Other Ambulatory Visit: Payer: Self-pay | Admitting: Internal Medicine

## 2023-02-08 ENCOUNTER — Ambulatory Visit: Payer: BC Managed Care – PPO | Admitting: Hematology and Oncology

## 2023-02-10 ENCOUNTER — Other Ambulatory Visit: Payer: Self-pay

## 2023-02-19 ENCOUNTER — Inpatient Hospital Stay: Payer: BC Managed Care – PPO | Attending: Hematology and Oncology | Admitting: Hematology and Oncology

## 2023-02-19 ENCOUNTER — Encounter: Payer: Self-pay | Admitting: Hematology and Oncology

## 2023-02-19 VITALS — BP 144/81 | HR 59 | Temp 97.7°F | Resp 18 | Ht 65.0 in | Wt 184.0 lb

## 2023-02-19 DIAGNOSIS — C9110 Chronic lymphocytic leukemia of B-cell type not having achieved remission: Secondary | ICD-10-CM | POA: Insufficient documentation

## 2023-02-19 NOTE — Assessment & Plan Note (Signed)
His examination is benign He has no signs of disease progression He is educated to watch out for signs and symptoms of disease progression The patient declined follow-up due to high co-pay In the meantime, he will continue to follow-up with his primary care doctor I will see him back once the patient is able to get Medicare insurance in the future

## 2023-02-19 NOTE — Progress Notes (Signed)
[facility] OFFICE PROGRESS NOTE  Patient Care Team: Marcine Matar, MD as PCP - General (Internal Medicine) Jena Gauss Gerrit Friends, MD as Consulting Physician (Gastroenterology)  ASSESSMENT & PLAN:  Chronic lymphocytic leukemia [facility]) His examination is benign He has no signs of disease progression He is educated to watch out for signs and symptoms of disease progression The patient declined follow-up due to high co-pay In the meantime, he will continue to follow-up with his primary care doctor I will see him back once the patient is able to get Medicare insurance in the future  No orders of the defined types were placed in this encounter.   All questions were answered. The patient knows to call the clinic with any problems, questions or concerns. The total time spent in the appointment was 20 minutes encounter with patients including review of chart and various tests results, discussions about plan of care and coordination of care plan   Artis Delay, MD 02/19/2023 2:34 PM  INTERVAL HISTORY: Please see below for problem oriented charting. he returns for surveillance follow-up The patient canceled his appointment last year He is being referred here back by his primary care doctor The patient stated he does not want to return here because of high co-pay that he cannot afford In the meantime, he denies new lymphadenopathy No recent infection We reviewed recent test results  REVIEW OF SYSTEMS:   Constitutional: Denies fevers, chills or abnormal weight loss Eyes: Denies blurriness of vision Ears, nose, mouth, throat, and face: Denies mucositis or sore throat Respiratory: Denies cough, dyspnea or wheezes Cardiovascular: Denies palpitation, chest discomfort or lower extremity swelling Gastrointestinal:  Denies nausea, heartburn or change in bowel habits Skin: Denies abnormal skin rashes Lymphatics: Denies new lymphadenopathy or easy bruising Neurological:Denies numbness,  tingling or new weaknesses Behavioral/Psych: Mood is stable, no new changes  All other systems were reviewed with the patient and are negative.  I have reviewed the past medical history, past surgical history, social history and family history with the patient and they are unchanged from previous note.  ALLERGIES:  has No Known Allergies.  MEDICATIONS:  Current Outpatient Medications  Medication Sig Dispense Refill   amLODipine (NORVASC) 10 MG tablet Take 1 tablet (10 mg total) by mouth daily. 90 tablet 1   atorvastatin (LIPITOR) 10 MG tablet Take 1 tablet (10 mg total) by mouth daily. 90 tablet 2   cetirizine (ZYRTEC) 10 MG tablet Take 1 tablet (10 mg total) by mouth daily. (Patient not taking: Reported on 01/18/2023) 90 tablet 0   diclofenac Sodium (VOLTAREN) 1 % GEL APPLY 4 G TOPICALLY 4 (FOUR) TIMES DAILY. 100 g 1   ibuprofen (ADVIL) 600 MG tablet Take 1 tablet (600 mg total) by mouth once every 6 (six) hours as needed. 30 tablet 0   levothyroxine (SYNTHROID) 150 MCG tablet Take 1 tablet (150 mcg total) by mouth daily. 90 tablet 1   lidocaine (LIDODERM) 5 % Place 1 patch onto the skin daily. Leave patch on for 12 hrs daily. 30 patch 1   losartan (COZAAR) 50 MG tablet Take 1 tablet (50 mg total) by mouth daily. (Must keep upcoming office visit for refills) 90 tablet 1   methocarbamol (ROBAXIN) 500 MG tablet Take 1 tablet (500 mg total) by mouth once nightly at bedtime as needed for muscle spasms. 20 tablet 0   neomycin-polymyxin-hydrocortisone (CORTISPORIN) OTIC solution Place 4 drops into both ears 4 (four) times daily. 10 mL 0   ofloxacin (FLOXIN OTIC) 0.3 %  OTIC solution Place 5 drops into both ears daily. 10 mL 0   pantoprazole (PROTONIX) 40 MG tablet Take 1 tablet (40 mg total) by mouth daily. 30 tablet 3   tamsulosin (FLOMAX) 0.4 MG CAPS capsule Take 1 capsule (0.4 mg total) by mouth daily. 30 capsule 3   No current facility-administered medications for this visit.    SUMMARY OF  ONCOLOGIC HISTORY: Oncology History  Chronic lymphocytic leukemia (HCC)  09/05/2021 Initial Diagnosis   Chronic lymphocytic leukemia (HCC)   09/05/2021 Cancer Staging   Staging form: Chronic Lymphocytic Leukemia / Small Lymphocytic Lymphoma, AJCC 8th Edition - Clinical stage from 09/05/2021: Modified Rai Stage 0 (Modified Rai risk: Low, Lymphocytosis: Present, Adenopathy: Unknown, Organomegaly: Unknown, Anemia: Absent, Thrombocytopenia: Absent) - Signed by Artis Delay, MD on 09/05/2021 Stage prefix: Initial diagnosis   09/23/2021 Imaging   IMPRESSION: 1. Prominent bilateral external iliac lymph nodes measuring up to 1.0 cm in short axis. Otherwise, no enlarged lymph nodes seen in the chest, abdomen or pelvis. 2. Unchanged mild dilation of the ascending thoracic aorta, measuring up to 4.2 cm. Recommend annual imaging followup by CTA or MRA.      PHYSICAL EXAMINATION: ECOG PERFORMANCE STATUS: 0 - Asymptomatic  Vitals:   02/19/23 1120  BP: (!) 144/81  Pulse: (!) 59  Resp: 18  Temp: 97.7 F (36.5 C)  SpO2: 98%   Filed Weights   02/19/23 1120  Weight: 184 lb (83.5 kg)    GENERAL:alert, no distress and comfortable SKIN: skin color, texture, turgor are normal, no rashes or significant lesions EYES: normal, Conjunctiva are pink and non-injected, sclera clear OROPHARYNX:no exudate, no erythema and lips, buccal mucosa, and tongue normal  NECK: supple, thyroid normal size, non-tender, without nodularity LYMPH:  no palpable lymphadenopathy in the cervical, axillary or inguinal LUNGS: clear to auscultation and percussion with normal breathing effort HEART: regular rate & rhythm and no murmurs and no lower extremity edema ABDOMEN:abdomen soft, non-tender and normal bowel sounds Musculoskeletal:no cyanosis of digits and no clubbing  NEURO: alert & oriented x 3 with fluent speech, no focal motor/sensory deficits  LABORATORY DATA:  I have reviewed the data as listed    Component Value  Date/Time   NA 139 09/15/2022 1105   K 4.4 09/15/2022 1105   CL 99 09/15/2022 1105   CO2 24 09/15/2022 1105   GLUCOSE 106 (H) 09/15/2022 1105   GLUCOSE 94 09/16/2021 1551   BUN 13 09/15/2022 1105   CREATININE 0.73 (L) 09/15/2022 1105   CREATININE 0.82 09/16/2021 1551   CREATININE 0.79 07/09/2016 1008   CALCIUM 9.5 09/15/2022 1105   PROT 6.9 09/15/2022 1105   ALBUMIN 4.8 09/15/2022 1105   AST 22 09/15/2022 1105   AST 19 09/16/2021 1551   ALT 32 09/15/2022 1105   ALT 29 09/16/2021 1551   ALKPHOS 85 09/15/2022 1105   BILITOT 0.4 09/15/2022 1105   BILITOT 0.5 09/16/2021 1551   GFRNONAA >60 09/16/2021 1551   GFRNONAA >89 07/09/2016 1008   GFRAA 115 08/06/2020 1701   GFRAA >89 07/09/2016 1008    No results found for: "SPEP", "UPEP"  Lab Results  Component Value Date   WBC 17.6 (H) 01/18/2023   NEUTROABS 4.5 01/18/2023   HGB 15.0 01/18/2023   HCT 44.7 01/18/2023   MCV 82 01/18/2023   PLT 178 01/18/2023      Chemistry      Component Value Date/Time   NA 139 09/15/2022 1105   K 4.4 09/15/2022 1105   CL 99  09/15/2022 1105   CO2 24 09/15/2022 1105   BUN 13 09/15/2022 1105   CREATININE 0.73 (L) 09/15/2022 1105   CREATININE 0.82 09/16/2021 1551   CREATININE 0.79 07/09/2016 1008      Component Value Date/Time   CALCIUM 9.5 09/15/2022 1105   ALKPHOS 85 09/15/2022 1105   AST 22 09/15/2022 1105   AST 19 09/16/2021 1551   ALT 32 09/15/2022 1105   ALT 29 09/16/2021 1551   BILITOT 0.4 09/15/2022 1105   BILITOT 0.5 09/16/2021 1551       RADIOGRAPHIC STUDIES: I have personally reviewed the radiological images as listed and agreed with the findings in the report. No results found.

## 2023-02-26 ENCOUNTER — Other Ambulatory Visit: Payer: Self-pay

## 2023-03-01 ENCOUNTER — Other Ambulatory Visit: Payer: Self-pay

## 2023-03-19 ENCOUNTER — Other Ambulatory Visit (HOSPITAL_COMMUNITY): Payer: Self-pay

## 2023-03-19 ENCOUNTER — Other Ambulatory Visit: Payer: Self-pay

## 2023-03-29 ENCOUNTER — Encounter: Payer: Self-pay | Admitting: Cardiology

## 2023-03-29 ENCOUNTER — Other Ambulatory Visit: Payer: Self-pay

## 2023-03-29 ENCOUNTER — Ambulatory Visit: Payer: BC Managed Care – PPO | Attending: Cardiology | Admitting: Cardiology

## 2023-03-29 VITALS — BP 122/80 | HR 71 | Ht 63.0 in | Wt 183.2 lb

## 2023-03-29 DIAGNOSIS — E782 Mixed hyperlipidemia: Secondary | ICD-10-CM

## 2023-03-29 DIAGNOSIS — J439 Emphysema, unspecified: Secondary | ICD-10-CM

## 2023-03-29 DIAGNOSIS — Z01810 Encounter for preprocedural cardiovascular examination: Secondary | ICD-10-CM | POA: Diagnosis not present

## 2023-03-29 DIAGNOSIS — I77810 Thoracic aortic ectasia: Secondary | ICD-10-CM | POA: Diagnosis not present

## 2023-03-29 DIAGNOSIS — R7303 Prediabetes: Secondary | ICD-10-CM

## 2023-03-29 DIAGNOSIS — I10 Essential (primary) hypertension: Secondary | ICD-10-CM

## 2023-03-29 DIAGNOSIS — R072 Precordial pain: Secondary | ICD-10-CM

## 2023-03-29 DIAGNOSIS — R079 Chest pain, unspecified: Secondary | ICD-10-CM

## 2023-03-29 MED ORDER — METOPROLOL TARTRATE 100 MG PO TABS
100.0000 mg | ORAL_TABLET | Freq: Once | ORAL | 0 refills | Status: DC
  Filled 2023-03-29 (×2): qty 1, 1d supply, fill #0

## 2023-03-29 NOTE — Progress Notes (Unsigned)
Cardiology Office Note:  .   Date:  03/29/2023  ID:  Darren [name], DOB [birth year], MRN [id number] PCP: Marcine Matar, MD  [facility] Providers Cardiologist:  None {; previously seen by Dr. Charlean Merl to update primary MD,subspecialty MD or APP then REFRESH:1}    No chief complaint on file.   Fluoroscopy presents here for *** at the request of Marcine Matar, MD.  Darren [name] was previously seen by Dr. Antoine Poche back in January 2018 for evaluation of CT of the chest showing dilated ascending aorta 4.2 cm.  He was actually seen by Dr. Laneta Simmers with a plan to follow-up with CT in 1 year that never happened.  Echocardiogram done shortly after did not comment on the aorta.  He noted some chest discomfort usually associated with being emotionally upset.  Activity limited by back pain, but not by ADLs.  Substernal pain with no radiation.  No associated nausea vomiting or dyspnea. Ordered today GXT/ETT and reviewed echo results. => Follow-up in April 2018-indicated that leg pain limited his walking and therefore was not able to reach target heart rate.  Only noted rare vague chest discomfort.  Less prominent.  Physical activity such as weeding or gardening did not syncope.  As such, he felt that this was nonanginal chest pain.    Subjective   INTERVAL HISTORY   ROS:  Cardiovascular ROS: {roscv:310661} Review of Systems - {ros master:310782}     Objective   Studies Reviewed: Marland Kitchen       ECHO 09/2016: Normal LV size and function.  Mild concentric LVH.  EF 55 to 60%.  No RWMA.  "Normal "diastolic parameters  ETT 09/2016: Exercised 6:33 min.  Peak HR 170 bpm (70% MPHR.  7.8 METS.  No angina during stress.  Symptom limited due to fatigue and dyspnea.  Most limited by leg pain.  Normal stress test based on symptom limited protocol. CTA Chest-Abdomen-Pelvis (08/2021): Mild Aortic Atherosclerosis with mild dilation of Ascending Thoracic Aorta measuring 4.2 cm (seen in 06/2016.   Prominent bilateral external iliac lymph nodes measuring up to 1 cm.  LABS: Lab Results  Component Value Date   CHOL 150 09/15/2022   HDL 52 09/15/2022   LDLCALC 80 09/15/2022   TRIG 95 09/15/2022   CHOLHDL 2.9 09/15/2022   Lab Results  Component Value Date   HGBA1C 5.9 (H) 01/18/2023     Chemistry      Component Value Date/Time   NA 143 03/29/2023 1620   K 4.0 03/29/2023 1620   CL 107 (H) 03/29/2023 1620   CO2 24 03/29/2023 1620   BUN 14 03/29/2023 1620   CREATININE 0.65 (L) 03/29/2023 1620   CREATININE 0.82 09/16/2021 1551   CREATININE 0.79 07/09/2016 1008      Component Value Date/Time   CALCIUM 9.3 03/29/2023 1620   ALKPHOS 85 09/15/2022 1105   AST 22 09/15/2022 1105   AST 19 09/16/2021 1551   ALT 32 09/15/2022 1105   ALT 29 09/16/2021 1551   BILITOT 0.4 09/15/2022 1105   BILITOT 0.5 09/16/2021 1551     Lab Results  Component Value Date   WBC 17.6 (H) 01/18/2023   HGB 15.0 01/18/2023   HCT 44.7 01/18/2023   MCV 82 01/18/2023   PLT 178 01/18/2023     Risk Assessment/Calculations:              Physical Exam:   VS:  BP 122/80 (BP Location: Left Arm, Patient Position: Sitting, Cuff Size:  Normal)   Pulse 71   Ht 5\' 3"  (1.6 m)   Wt 183 lb 3.2 oz (83.1 kg)   SpO2 94%   BMI 32.45 kg/m    Wt Readings from Last 3 Encounters:  03/29/23 183 lb 3.2 oz (83.1 kg)  02/19/23 184 lb (83.5 kg)  01/18/23 178 lb (80.7 kg)    GEN: Well nourished, well groomed in no acute distress; obese NECK: No JVD; No carotid bruits CARDIAC: Normal S1, S2; RRR, no murmurs, rubs, gallops; nondisplaced PMI. RESPIRATORY: Increased AP diameter, mostly CTAB with mild interstitial sounds but no obvious W/R/R .  Nonlabored.  Good air movement. ABDOMEN: Soft, non-tender, non-distended EXTREMITIES:  No C/C/C; No deformity      ASSESSMENT AND PLAN: .    Problem List Items Addressed This Visit   None Visit Diagnoses     Chest pain of uncertain etiology    -  Primary   Relevant  Orders   EKG 12-Lead (Completed)               Dispo: No follow-ups on file.  Total time spent: 41 min spent with patient + 23 min spent charting = 64 min     Signed, Marykay Lex, MD, MS Bryan Lemma, M.D., M.S. Interventional Cardiologist  [facility]  Pager # [phone] Phone # [phone] [address] [place], [place] [address]

## 2023-03-29 NOTE — Patient Instructions (Addendum)
Medication Instructions:   See instruction - one time dose of Metoprolol tartrate 100 mg   *If you need a refill on your cardiac medications before your next appointment, please call your pharmacy*   Lab Work: BMP - today If you have labs (blood work) drawn today and your tests are completely normal, you will receive your results only by: MyChart Message (if you have MyChart) OR A paper copy in the mail If you have any lab test that is abnormal or we need to change your treatment, we will call you to review the results.   Testing/Procedures: Will be schedule at [facility] -radiology Your physician has requested that you have coronary  CTA. Coronary computed tomography (CT)angiogram  is a special type of CT scan that uses a computer to produce multi-dimensional views of major blood vessels throughout the heart.  CT angiography, a contrast material is injected through an IV to help visualize the blood vessels  a painless test that uses an x-ray machine to take clear, detailed pictures of your heart arteries .  Please follow instruction sheet as given.    Follow-Up: At [facility], you and your health needs are our priority.  As part of our continuing mission to provide you with exceptional heart care, we have created designated Provider Care Teams.  These Care Teams include your primary Cardiologist (physician) and Advanced Practice Providers (APPs -  Physician Assistants and Nurse Practitioners) who all work together to provide you with the care you need, when you need it.     Your next appointment:   2 month(s)  The format for your next appointment:   In Person  Provider:   Bryan Lemma, MD or APP   Other Instructions     Your cardiac CT will be scheduled at the below location:   [facility] [address] [place], [place] [address] [phone]    Please arrive at the [facility] En[facility] (Entrance C2) of [facility] 30 minutes  prior to test start time. You can use the FREE valet parking offered at entrance C (encouraged to control the heart rate for the test)  Proceed to the [facility] Radiology Department (first floor) to check-in and test prep.  All radiology patients and guests should use entrance C2 at [facility], accessed from [facility], even though the hospital's physical address listed is [address].       Please follow these instructions carefully (unless otherwise directed):  An IV will be required for this test and Nitroglycerin will be given.  Hold all erectile dysfunction medications at least 3 days (72 hrs) prior to test. (Ie viagra, cialis, sildenafil, tadalafil, etc)    BMP today    On the Night Before the Test: Be sure to Drink plenty of water. Do not consume any caffeinated/decaffeinated beverages or chocolate 12 hours prior to your test. Do not take any antihistamines 12 hours prior to your test.   On the Day of the Test: Drink plenty of water until 1 hour prior to the test. Do not eat any food 1 hour prior to test. You may take your regular medications prior to the test.  Take metoprolol (Lopressor)  100 mg two hours prior to test.        After the Test: Drink plenty of water. After receiving IV contrast, you may experience a mild flushed feeling. This is normal. On occasion, you may experience a mild rash up to  24 hours after the test. This is not dangerous. If this occurs, you can take Benadryl 25 mg and increase your fluid intake. If you experience trouble breathing, this can be serious. If it is severe call 911 IMMEDIATELY. If it is mild, please call our office.   We will call to schedule your test 2-4 weeks out understanding that some insurance companies will need an authorization prior to the service being performed.   For more information and frequently asked questions, please visit our website : [email or web address]  For  non-scheduling related questions, please contact the cardiac imaging nurse navigator should you have any questions/concerns: Rockwell Alexandria, Cardiac Imaging Nurse Navigator Larey Brick, Cardiac Imaging Nurse Navigator [facility] Direct Office Dial: [phone]   For scheduling needs, including cancellations and rescheduling, please call [place], [phone].

## 2023-03-30 LAB — BASIC METABOLIC PANEL
BUN/Creatinine Ratio: 22 (ref 10–24)
BUN: 14 mg/dL (ref 8–27)
CO2: 24 mmol/L (ref 20–29)
Calcium: 9.3 mg/dL (ref 8.6–10.2)
Chloride: 107 mmol/L — ABNORMAL HIGH (ref 96–106)
Creatinine, Ser: 0.65 mg/dL — ABNORMAL LOW (ref 0.76–1.27)
Glucose: 80 mg/dL (ref 70–99)
Potassium: 4 mmol/L (ref 3.5–5.2)
Sodium: 143 mmol/L (ref 134–144)
eGFR: 107 mL/min/{1.73_m2} (ref >59)

## 2023-04-01 ENCOUNTER — Encounter: Payer: Self-pay | Admitting: Cardiology

## 2023-04-01 DIAGNOSIS — Z01810 Encounter for preprocedural cardiovascular examination: Secondary | ICD-10-CM | POA: Insufficient documentation

## 2023-04-01 NOTE — Assessment & Plan Note (Signed)
Clearly this is part of the reason for his exertional dyspnea.  COPD itself would be a risk factor for surgery that would be better assessed by PCP.

## 2023-04-01 NOTE — Assessment & Plan Note (Signed)
Clarification.  4.2 cm does not meet criteria for aneurysm.  Was stable from 2017-2022.  Should be due for follow-up.  Since we plan to check the Coronary CTA they can also assess the thoracic aorta.   Continue BP control and lipid management.

## 2023-04-01 NOTE — Assessment & Plan Note (Signed)
Unusual chest discomfort that he describes somewhat in the center of the chest but also under his left breast.  It does sound to be more musculoskeletal and is not associated with exertion.  Partly because of his inability to walk around much with back pain, I think we are limited as far as assessing his functional capacity.  He was not able to complete ETT 6 years ago because of back and leg pain so I do not think you will be able to complete a treadmill stress test this time around.   Will check for ischemic CAD with Coronary CTA which provides information about underlying plaque burden as well as any potential obstructive disease.  This is better dated and is obtained from [facility].  Plan: Coronary CTA (will need BMP first.).

## 2023-04-01 NOTE — Assessment & Plan Note (Signed)
Difficult to fully assess the DASI score because of him being limited by back pain.  Was not previously able to do ETT.  Since he is having some chest discomfort, reasonable to proceed with ischemic evaluation prior to completing full evaluation.  Otherwise, RCRI without having obstructive CAD is Class I Risk, with CAD noted but no active symptoms or positive CT FFR, would still be Class 1; positive CT FFR with The Class II, although in the absence of true active symptoms, probably would recommend proceeding with back surgery prior to invasive ischemic evaluation.  Plan: Coronary CTA

## 2023-04-01 NOTE — Assessment & Plan Note (Signed)
Most recent A1c was down to 5.9.  Not on any medications yet.  Elevated blood sugar levels plus HTN and obesity please criteria for metabolic syndrome.   Recommend low threshold to initiate treatment for diabetes if A1c increases.

## 2023-04-01 NOTE — Assessment & Plan Note (Signed)
Lipids checked in December 2023-LDL 80 on 10 mg of atorvastatin.  Pending results Coronary CTA, mainly to be more aggressive.

## 2023-04-01 NOTE — Progress Notes (Signed)
Cardiology Office Note:  .   Date:  04/01/2023  ID:  Darren [name], DOB 1961-02-09, MRN [id number] PCP: Marcine Matar, MD  [facility] Providers Cardiologist:  Bryan Lemma, MD     Chief Complaint  Patient presents with   New Patient (Initial Visit)   Pre-op Exam    Back surgery    History of Present Illness: .     Darren [name] is an obese 62 y.o. male former smoker (quit smoking in 2005) with a PMH notable for HTN, HLD, pre-DM 2 COPD/Centrilobular Emphysema (chronic DOE), CLL (Dr. Lyna Poser), Dilated Thoracic Aorta (4.2 cm), chronic LBP (limits walking), Barrett's Esophagus who presents here for Preop Evaluation with Complaints of Occasional Chest Pain at the request of Marcine Matar, MD.  Darren [name] was  previously seen by Dr. Antoine Poche back in January 2018 for evaluation of CT of the chest showing dilated ascending aorta 4.2 cm.  He was actually seen by Dr. Laneta Simmers with a plan to follow-up with CT in 1 year that never happened.  Echocardiogram done shortly after did not comment on the aorta.  He noted some chest discomfort usually associated with being emotionally upset.  Activity limited by back pain, but not by ADLs.  Substernal pain with no radiation.  No associated nausea vomiting or dyspnea. Ordered today GXT/ETT and reviewed echo results. => Follow-up in April 2018-indicated that leg pain limited his walking and therefore was not able to reach target heart rate.  Only noted rare vague chest discomfort.  Less prominent.  Physical activity such as weeding or gardening did not induce symptoms.  As such, he felt that this was nonanginal chest pain.     Subjective   INTERVAL HISTORY Darren [name] was just seen by Dr. Laural Benes on 01/18/2023 for follow-up as well as preop evaluation for low back surgery.  Was working with attorney because of injury the previous summer.  Still not sure if he was go to surgery.  Fairly active.   Initially denied any chest pain but noted  occasional dyspnea.  Event noted discomfort under the left breast occasionally during that can last up to 1 to 2 hours. => She indicated that she felt he should be evaluated by cardiology and oncology prior to surgery.  So he was referred back to cardiology having not been seen since 2018.  Today he presents again mentioning the need for potential back surgery.  He is not excited about the idea of doing it but decided to go ahead and go forward with that because he was having significant difficulty getting comfortable and was more more limited by back pain.  He is not able to walk very far now, and is having hard time with work.  He says he every now and then feels this we are tightness in his chest when he is stressed out or under social pressure.  He often happens with his back is hurting a lot as well.  He denies any chest pain when he is doing routine activities looks like he said to Dr. Antoine Poche.  He denies CHF symptoms of PND, orthopnea or edema.  No palpitations or arrhythmias.  Baseline exertional dyspnea, but no more than that => notably worse when he goes out to the heat.    ROS:  Cardiovascular ROS: positive for - chest pain and dyspnea on exertion negative for - edema, irregular heartbeat, orthopnea, palpitations, paroxysmal nocturnal dyspnea, rapid heart rate, or syncope or near syncope TIA/amaurosis  fugax or claudication Review of Systems - Negative except significant back pain Respiratory ROS: positive for - cough, shortness of breath, and this is usually made worse when he is out in the heat or overdoes it.  Morning cough is not that significant.  Occasional wheezing. negative for - hemoptysis, sputum changes, or stridor Gastrointestinal ROS: negative for - blood in stools, hematemesis, or melena Genito-Urinary ROS: negative for - dysuria or hematuria Musculoskeletal ROS: positive for - gait disturbance and pain in back - generalized, lower, with radicular pain down the legs on  occasion.     Objective   Studies Reviewed: Marland Kitchen   EKG Interpretation Date/Time:  Monday March 29 2023 14:43:54 EDT Ventricular Rate:  71 PR Interval:  196 QRS Duration:  112 QT Interval:  378 QTC Calculation: 410 R Axis:   -49  Text Interpretation: Normal sinus rhythm with sinus arrhythmia Left anterior fascicular block Minimal voltage criteria for LVH, may be normal variant ( Cornell product ) When compared with ECG of 18-Jan-2023 14:45, Left anterior fascicular block is now Present  = axis only slightly more Left -- No significant change since Confirmed by Bryan Lemma ([id number]) on 03/29/2023 3:34:16 PM    ECHO 09/2016: Normal LV size and function.  EF 55 to 60%.  No RWMA. ETT 09/2016: Exercised 6:33 min.  Reached 117 bpm (7% MPHR 165 bpm).  7.8 METS.  No ischemic EKG changes.  Stopped due to leg fatigue from back pain.  Noted dyspnea.  No chest pain. => Negative symptom limited ETT. CTA Chest-Abd-Pelvis 08/2021: Unchanged mild aortic atherosclerosis with mild dilation of ascending aorta (4.2 cm-stable from 06/2016).  Prominent external iliac lymph nodes up to 1 cm in short axis. Lab Results  Component Value Date   CHOL 150 09/15/2022   HDL 52 09/15/2022   LDLCALC 80 09/15/2022   TRIG 95 09/15/2022   CHOLHDL 2.9 09/15/2022   Lab Results  Component Value Date   CREATININE 0.65 (L) 03/29/2023   BUN 14 03/29/2023   NA 143 03/29/2023   K 4.0 03/29/2023   CL 107 (H) 03/29/2023   CO2 24 03/29/2023   Lab Results  Component Value Date   WBC 17.6 (H) 01/18/2023   HGB 15.0 01/18/2023   HCT 44.7 01/18/2023   MCV 82 01/18/2023   PLT 178 01/18/2023   Lab Results  Component Value Date   HGBA1C 5.9 (H) 01/18/2023     Risk Assessment/Calculations:         Revised Cardiac Risk Index (RCRI): Elevated risk surgery: No History of ischemic heart disease: No (although pending Coronary CTA) History of CHF: No History of CVA/Cerebrovascular Disease: No Preop DM-insulin: No Preop Cr  >2: No  Class I Risk (1-3.9% 30-day risk of death MI or cardiac arrest.)  D[facility] Activity Status Index (DASI): Is the patient able to: Take care of himself Yes  Walk indoors Yes  Walk 1-2 blocks on level ground Yes  Climb a flight of stairs or walk uphill Yes but limited by back pain  Run short distance No-also limited by back pain  Light work around the house Yes  Moderate around the house Yes  Heavy work around the house No-Limited by back pain  Pincus Badder work: No (limited by back) No-Limited by back pain  Participate in strenuous sports, No  Participated moderate recreational activities No  Sexual relations N/A     Total points 28.7 METS 6.27 but not able to exercise now because of back pain  Physical Exam:   VS:  BP 122/80 (BP Location: Left Arm, Patient Position: Sitting, Cuff Size: Normal)   Pulse 71   Ht 5\' 3"  (1.6 m)   Wt 183 lb 3.2 oz (83.1 kg)   SpO2 94%   BMI 32.45 kg/m    Wt Readings from Last 3 Encounters:  03/29/23 183 lb 3.2 oz (83.1 kg)  02/19/23 184 lb (83.5 kg)  01/18/23 178 lb (80.7 kg)    GEN: Well nourished, well groomed in no acute distress; obese NECK: No JVD; No carotid bruits CARDIAC: Normal S1, S2; RRR, no murmurs, rubs, gallops RESPIRATORY: Distant breath sounds but no obvious W/R/R.  Nonlabored, good air movement.. ABDOMEN: Soft, non-tender, non-distended EXTREMITIES:  No edema; No deformity -did not palpate pulses-was wearing boots.     ASSESSMENT AND PLAN: .    Problem List Items Addressed This Visit       Cardiology Problems   Hyperlipidemia    Lipids checked in December 2023-LDL 80 on 10 mg of atorvastatin.  Pending results Coronary CTA, mainly to be more aggressive.      Essential hypertension (Chronic)    Blood pressure actually is pretty good today on amlodipine 10 mg daily and losartan 50 mg daily.  Not on diuretic at this point.  Otherwise stable.      Dilation of thoracic aorta (HCC) (Chronic)    Clarification.  4.2 cm does  not meet criteria for aneurysm.  Was stable from 2017-2022.  Should be due for follow-up.  Since we plan to check the Coronary CTA they can also assess the thoracic aorta.   Continue BP control and lipid management.      Relevant Orders   CT CORONARY MORPH W/CTA COR W/SCORE W/CA W/CM &/OR WO/CM     Other   Preop cardiovascular exam - Primary    Difficult to fully assess the DASI score because of him being limited by back pain.  Was not previously able to do ETT.  Since he is having some chest discomfort, reasonable to proceed with ischemic evaluation prior to completing full evaluation.  Otherwise, RCRI without having obstructive CAD is Class I Risk, with CAD noted but no active symptoms or positive CT FFR, would still be Class 1; positive CT FFR with The Class II, although in the absence of true active symptoms, probably would recommend proceeding with back surgery prior to invasive ischemic evaluation.  Plan: Coronary CTA      Prediabetes    Most recent A1c was down to 5.9.  Not on any medications yet.  Elevated blood sugar levels plus HTN and obesity please criteria for metabolic syndrome.   Recommend low threshold to initiate treatment for diabetes if A1c increases.      Precordial pain    Unusual chest discomfort that he describes somewhat in the center of the chest but also under his left breast.  It does sound to be more musculoskeletal and is not associated with exertion.  Partly because of his inability to walk around much with back pain, I think we are limited as far as assessing his functional capacity.  He was not able to complete ETT 6 years ago because of back and leg pain so I do not think you will be able to complete a treadmill stress test this time around.   Will check for ischemic CAD with Coronary CTA which provides information about underlying plaque burden as well as any potential obstructive disease.  This is better dated and is  obtained from [facility].  Plan:  Coronary CTA (will need BMP first.).      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  COPD itself would be a risk factor for surgery that would be better assessed by PCP.      Other Visit Diagnoses     Chest pain of uncertain etiology       Relevant Orders   EKG 12-Lead (Completed)   Basic metabolic panel (Completed)   CT CORONARY MORPH W/CTA COR W/SCORE W/CA W/CM &/OR WO/CM               Dispo: Return in about 2 months (around 05/30/2023) for Routine Follow-up after testing ~ 1-2 months, Routine follow up with me, or APP .  Total time spent: 41 min spent with patient + 32 min spent charting = 71 min      Signed, Marykay Lex, MD, MS Bryan Lemma, M.D., M.S. Interventional Cardiologist  [facility]  Pager # [phone] Phone # [phone] [address] [place], [place] [address]

## 2023-04-01 NOTE — Assessment & Plan Note (Signed)
Blood pressure actually is pretty good today on amlodipine 10 mg daily and losartan 50 mg daily.  Not on diuretic at this point.  Otherwise stable.

## 2023-04-02 ENCOUNTER — Other Ambulatory Visit: Payer: Self-pay

## 2023-04-02 ENCOUNTER — Other Ambulatory Visit (HOSPITAL_COMMUNITY): Payer: Self-pay

## 2023-04-05 ENCOUNTER — Other Ambulatory Visit: Payer: Self-pay

## 2023-04-21 ENCOUNTER — Other Ambulatory Visit: Payer: Self-pay | Admitting: Internal Medicine

## 2023-04-21 ENCOUNTER — Other Ambulatory Visit: Payer: Self-pay

## 2023-04-21 MED ORDER — LIDOCAINE 5 % EX PTCH
1.0000 | MEDICATED_PATCH | CUTANEOUS | 0 refills | Status: DC
  Filled 2023-04-21: qty 30, 30d supply, fill #0

## 2023-04-26 ENCOUNTER — Other Ambulatory Visit: Payer: Self-pay

## 2023-04-27 ENCOUNTER — Other Ambulatory Visit: Payer: Self-pay

## 2023-05-19 ENCOUNTER — Other Ambulatory Visit: Payer: Self-pay | Admitting: Internal Medicine

## 2023-05-19 ENCOUNTER — Other Ambulatory Visit: Payer: Self-pay

## 2023-05-24 ENCOUNTER — Other Ambulatory Visit: Payer: Self-pay

## 2023-05-24 ENCOUNTER — Ambulatory Visit: Payer: Self-pay | Admitting: *Deleted

## 2023-05-24 ENCOUNTER — Other Ambulatory Visit: Payer: Self-pay | Admitting: Internal Medicine

## 2023-05-24 ENCOUNTER — Encounter (HOSPITAL_COMMUNITY): Payer: Self-pay

## 2023-05-24 MED ORDER — LIDOCAINE 5 % EX PTCH
1.0000 | MEDICATED_PATCH | CUTANEOUS | 0 refills | Status: DC
  Filled 2023-05-24: qty 30, 30d supply, fill #0

## 2023-05-24 MED ORDER — LIDOCAINE 5 % EX PTCH: 1.0000 | MEDICATED_PATCH | CUTANEOUS | 0 refills | Status: DC

## 2023-05-24 NOTE — Telephone Encounter (Signed)
Reason for Disposition  [1] Caller has URGENT medicine question about med that PCP or specialist prescribed AND [2] triager unable to answer question  Answer Assessment - Initial Assessment Questions 1. NAME of MEDICINE: "What medicine(s) are you calling about?"      lidocaine patches 2. QUESTION: "What is your question?" (e.g., double dose of medicine, side effect)     Patient is requesting provider consider filling his patches that he uses for his back.  3. PRESCRIBER: "Who prescribed the medicine?" Reason: if prescribed by specialist, call should be referred to that group.     PCP 4. SYMPTOMS: "Do you have any symptoms?" If Yes, ask: "What symptoms are you having?"  "How bad are the symptoms (e.g., mild, moderate, severe)      Back pain- patient states he can not afford to have surgery at this time- he is the only one working and has to pay his bills and does not have income to miss work. Patient has an appointment 06/03/23 to see provider- maybe patient can have another plan for care. Patient states he is in process of trying to get a plan for back injury. Patient would like to pick Rx up today if possible.  Protocols used: Medication Question Call-A-AH

## 2023-05-24 NOTE — Telephone Encounter (Signed)
Refilled

## 2023-05-24 NOTE — Addendum Note (Signed)
Addended by: Hoy Register on: 05/24/2023 05:49 PM   Modules accepted: Orders

## 2023-05-24 NOTE — Telephone Encounter (Signed)
Pt is requesting lidocaine patches.

## 2023-05-25 ENCOUNTER — Other Ambulatory Visit: Payer: Self-pay

## 2023-05-25 NOTE — Telephone Encounter (Signed)
VM has been left informing patient that medication has been sent to pharmacy.

## 2023-06-03 ENCOUNTER — Other Ambulatory Visit: Payer: Self-pay

## 2023-06-03 ENCOUNTER — Ambulatory Visit
Admission: RE | Admit: 2023-06-03 | Discharge: 2023-06-03 | Disposition: A | Discharge status: Home / Self Care | Payer: BC Managed Care – PPO | Source: Ambulatory Visit | Attending: Physician Assistant | Admitting: Physician Assistant

## 2023-06-03 ENCOUNTER — Encounter: Payer: Self-pay | Admitting: Physician Assistant

## 2023-06-03 ENCOUNTER — Ambulatory Visit: Payer: BC Managed Care – PPO | Attending: Physician Assistant | Admitting: Physician Assistant

## 2023-06-03 VITALS — BP 131/78 | HR 64 | Wt 182.6 lb

## 2023-06-03 DIAGNOSIS — E782 Mixed hyperlipidemia: Secondary | ICD-10-CM | POA: Diagnosis not present

## 2023-06-03 DIAGNOSIS — I10 Essential (primary) hypertension: Secondary | ICD-10-CM

## 2023-06-03 DIAGNOSIS — M79671 Pain in right foot: Secondary | ICD-10-CM

## 2023-06-03 DIAGNOSIS — R7303 Prediabetes: Secondary | ICD-10-CM | POA: Diagnosis not present

## 2023-06-03 DIAGNOSIS — M7731 Calcaneal spur, right foot: Secondary | ICD-10-CM | POA: Diagnosis not present

## 2023-06-03 DIAGNOSIS — E039 Hypothyroidism, unspecified: Secondary | ICD-10-CM | POA: Diagnosis not present

## 2023-06-03 MED ORDER — MELOXICAM 7.5 MG PO TABS
7.5000 mg | ORAL_TABLET | Freq: Every day | ORAL | 1 refills | Status: DC
  Filled 2023-06-03 – 2023-06-16 (×2): qty 30, 30d supply, fill #0

## 2023-06-03 MED ORDER — LIDOCAINE 5 % EX PTCH
1.0000 | MEDICATED_PATCH | CUTANEOUS | 3 refills | Status: DC
  Filled 2023-06-03 – 2023-06-25 (×2): qty 30, 30d supply, fill #0
  Filled 2023-07-29: qty 30, 30d supply, fill #1
  Filled 2023-08-30: qty 30, 30d supply, fill #2
  Filled 2023-10-06 (×2): qty 30, 30d supply, fill #3
  Filled ????-??-??: fill #0

## 2023-06-03 MED ORDER — LOSARTAN POTASSIUM 50 MG PO TABS
50.0000 mg | ORAL_TABLET | Freq: Every day | ORAL | 1 refills | Status: DC
  Filled 2023-06-03 – 2023-08-20 (×2): qty 90, 90d supply, fill #0
  Filled 2023-11-25: qty 90, 90d supply, fill #1
  Filled ????-??-??: fill #0

## 2023-06-03 MED ORDER — ATORVASTATIN CALCIUM 10 MG PO TABS
10.0000 mg | ORAL_TABLET | Freq: Every day | ORAL | 2 refills | Status: DC
  Filled 2023-06-03 – 2023-06-25 (×2): qty 90, 90d supply, fill #0
  Filled 2023-06-25: qty 30, 30d supply, fill #0
  Filled 2023-09-20 (×2): qty 90, 90d supply, fill #1
  Filled 2023-12-24: qty 90, 90d supply, fill #0
  Filled ????-??-??: fill #0

## 2023-06-03 MED ORDER — AMLODIPINE BESYLATE 10 MG PO TABS
10.0000 mg | ORAL_TABLET | Freq: Every day | ORAL | 1 refills | Status: DC
  Filled 2023-06-03 – 2023-08-20 (×2): qty 90, 90d supply, fill #0
  Filled 2023-11-25: qty 90, 90d supply, fill #1
  Filled ????-??-??: fill #0

## 2023-06-03 NOTE — Progress Notes (Signed)
Patient ID: Darren [name], male   DOB: 04/15/1961, 62 y.o.   MRN: [id number]   Darren [name], is a 62 y.o. male  [id number]  [id number]  DOB - 06/16/1961  Chief Complaint  Patient presents with   Cyst    On right foot        Subjective:   Darren [name] is a 62 y.o. male here today for R lateral foot pain.  He stands for 12 hours on his feet at work.  He is having pain and swelling R lateral foot  No problems updated.  ALLERGIES: No Known Allergies  PAST MEDICAL HISTORY: Past Medical History:  Diagnosis Date   Arthritis    per Medical clearance form.   Barrett esophagus 2001   path report in EPIC 2001 c/w Barrett's. endoscopy report unavailable.   COPD (chronic obstructive pulmonary disease) (HCC)    per medical clearance form.   Depression    per Medical Clearance form(09/18/11)   Dilatation of thoracic aorta (HCC) 06/2016   4.2 cm   GERD (gastroesophageal reflux disease)    per Medical clearance form.   Hypertension    Per Medical Clearance form.   Hypothyroidism    per Medical Clearance form.    MEDICATIONS AT HOME: Prior to Admission medications   Medication Sig Start Date End Date Taking? Authorizing Provider  cetirizine (ZYRTEC) 10 MG tablet Take 1 tablet (10 mg total) by mouth daily. 12/30/22  Yes Marcine Matar, MD  ibuprofen (ADVIL) 600 MG tablet Take 1 tablet (600 mg total) by mouth once every 6 (six) hours as needed. 03/12/22  Yes Fayrene Helper, PA-C  levothyroxine (SYNTHROID) 150 MCG tablet Take 1 tablet (150 mcg total) by mouth daily. 01/20/23  Yes Marcine Matar, MD  meloxicam (MOBIC) 7.5 MG tablet Take 1 tablet (7.5 mg total) by mouth daily. 06/03/23  Yes Terris Germano, Marzella Schlein, PA-C  methocarbamol (ROBAXIN) 500 MG tablet Take 1 tablet (500 mg total) by mouth once nightly at bedtime as needed for muscle spasms. 03/12/22  Yes Fayrene Helper, PA-C  neomycin-polymyxin-hydrocortisone (CORTISPORIN) OTIC solution Place 4 drops into both ears 4 (four) times  daily. 12/30/21  Yes Marcine Matar, MD  ofloxacin (FLOXIN OTIC) 0.3 % OTIC solution Place 5 drops into both ears daily. 12/26/21  Yes Martin, Mary-Margaret, FNP  pantoprazole (PROTONIX) 40 MG tablet Take 1 tablet (40 mg total) by mouth daily. 01/18/23  Yes Marcine Matar, MD  tamsulosin (FLOMAX) 0.4 MG CAPS capsule Take 1 capsule (0.4 mg total) by mouth daily. 02/10/22  Yes Marcine Matar, MD  amLODipine (NORVASC) 10 MG tablet Take 1 tablet (10 mg total) by mouth daily. 06/03/23   Anders Simmonds, PA-C  atorvastatin (LIPITOR) 10 MG tablet Take 1 tablet (10 mg total) by mouth daily. 06/03/23   Anders Simmonds, PA-C  lidocaine (LIDODERM) 5 % Place 1 patch onto the skin daily. Leave patch on for 12 hrs daily. 06/03/23   Anders Simmonds, PA-C  losartan (COZAAR) 50 MG tablet Take 1 tablet (50 mg total) by mouth daily. 06/03/23   Anders Simmonds, PA-C  metoprolol tartrate (LOPRESSOR) 100 MG tablet Take 1 tablet (100 mg total) by mouth once for 1 dose. TAKE TWO HOURS PRIOR TO  SCHEDULE CARDIAC TEST 03/29/23 03/29/23  Marykay Lex, MD    ROS: Neg HEENT Neg resp Neg cardiac Neg GI Neg GU Neg MS Neg psych Neg neuro  Objective:   Vitals:   06/03/23 1546  BP:  131/78  Pulse: 64  SpO2: 93%  Weight: 182 lb 9.6 oz (82.8 kg)   Exam General appearance : Awake, alert, not in any distress. Speech Clear. Not toxic looking HEENT: Atraumatic and Normocephalic Neck: Supple, no JVD. No cervical lymphadenopathy.  Chest: Good air entry bilaterally, CTAB.  No rales/rhonchi/wheezing CVS: S1 S2 regular, no murmurs.  Extremities: B/L Lower Ext shows no edema, both legs are warm to touch  R foot -normal ROM.  There is a prominence on the R lateral foot over the 4th5 and 5th metatarsal that is very TTP without erythema Neurology: Awake alert, and oriented X 3, CN II-XII intact, Non focal Skin: No Rash  Data Review Lab Results  Component Value Date   HGBA1C 5.9 (H) 01/18/2023   HGBA1C 6.1 (H)  09/15/2022   HGBA1C 6.1 (H) 02/10/2022    Assessment & Plan   1. Acquired hypothyroidism Continue synthroid .  Will check labs next visit  2. Essential hypertension controlled - losartan (COZAAR) 50 MG tablet; Take 1 tablet (50 mg total) by mouth daily.  Dispense: 90 tablet; Refill: 1 - amLODipine (NORVASC) 10 MG tablet; Take 1 tablet (10 mg total) by mouth daily.  Dispense: 90 tablet; Refill: 1  3. Mixed hyperlipidemia  - atorvastatin (LIPITOR) 10 MG tablet; Take 1 tablet (10 mg total) by mouth daily.  Dispense: 90 tablet; Refill: 2  4. Prediabetes Work at a goal of eliminating sugary drinks, candy, desserts, sweets, refined sugars, processed foods, and white carbohydrates.     5. Right foot pain Continue lidoderm patch as needed and voltaren gel.  Concern for stress fx.   - DG Foot Complete Right; Future    Return in about 2 months (around 08/03/2023) for PCP for chronic conditions.  The patient was given clear instructions to go to ER or return to medical center if symptoms don't improve, worsen or new problems develop. The patient verbalized understanding. The patient was told to call to get lab results if they haven't heard anything in the next week.      Georgian Co, PA-C [facility] [place], [place] [phone]   06/03/2023, 5:36 PM

## 2023-06-04 ENCOUNTER — Other Ambulatory Visit: Payer: Self-pay

## 2023-06-07 ENCOUNTER — Other Ambulatory Visit: Payer: Self-pay | Admitting: Internal Medicine

## 2023-06-07 ENCOUNTER — Other Ambulatory Visit: Payer: Self-pay

## 2023-06-07 DIAGNOSIS — K219 Gastro-esophageal reflux disease without esophagitis: Secondary | ICD-10-CM

## 2023-06-07 MED ORDER — PANTOPRAZOLE SODIUM 40 MG PO TBEC
40.0000 mg | DELAYED_RELEASE_TABLET | Freq: Every day | ORAL | 3 refills | Status: DC
  Filled 2023-06-07: qty 90, 90d supply, fill #0

## 2023-06-14 NOTE — Progress Notes (Deleted)
Cardiology Clinic Note   Patient Name: Darren [name] Date of Encounter: 06/14/2023  Primary Care Provider:  Marcine Matar, MD Primary Cardiologist:  Bryan Lemma, MD  Patient Profile    Darren [name] 62 year old male presents to the clinic today for follow-up evaluation of his chest pain and hypertension.  Past Medical History    Past Medical History:  Diagnosis Date   Arthritis    per Medical clearance form.   Barrett esophagus 2001   path report in EPIC 2001 c/w Barrett's. endoscopy report unavailable.   COPD (chronic obstructive pulmonary disease) (HCC)    per medical clearance form.   Depression    per Medical Clearance form(09/18/11)   Dilatation of thoracic aorta (HCC) 06/2016   4.2 cm   GERD (gastroesophageal reflux disease)    per Medical clearance form.   Hypertension    Per Medical Clearance form.   Hypothyroidism    per Medical Clearance form.   Past Surgical History:  Procedure Laterality Date   BIOPSY  09/18/2016   Procedure: BIOPSY;  Surgeon: Corbin Ade, MD;  Location: [facility];  Service: Endoscopy;;  biopsy of distal esophagus   ESOPHAGOGASTRODUODENOSCOPY  2001   barrett's per path   ESOPHAGOGASTRODUODENOSCOPY (EGD) WITH PROPOFOL N/A 09/18/2016   Procedure: ESOPHAGOGASTRODUODENOSCOPY (EGD) WITH PROPOFOL;  Surgeon: Corbin Ade, MD;  Location: [facility];  Service: Endoscopy;  Laterality: N/A;  1200   GRADUATED EXERCISE TREADMILL STRESS TEST  09/2016   Exercised 6:33 min.  Reached 117 bpm (7% MPHR 165 bpm).  7.8 METS.  No ischemic EKG changes.  Stopped due to leg fatigue from back pain.  Noted dyspnea.  No chest pain. => Negative symptom limited ETT.   HEMORRHOID SURGERY     1982   MULTIPLE EXTRACTIONS WITH ALVEOLOPLASTY  11/30/2011   Procedure: MULTIPLE EXTRACION WITH ALVEOLOPLASTY;  Surgeon: Georgia Lopes, DDS;  Location: [facility] OR;  Service: Oral Surgery;  Laterality: Bilateral;   TRANSTHORACIC ECHOCARDIOGRAM  09/2016   Normal  LV size and function.  EF 55 to 60%.  No RWMA.  Normal valves.  No comment on aorta.    Allergies  No Known Allergies  History of Present Illness    [name] has a PMH of hyperlipidemia, essential hypertension, dilated thoracic aorta, prediabetes, precordial pain, and COPD.  He was seen and evaluated by Dr. Herbie Baltimore on 03/29/2023.  He was requesting preoperative cardiac evaluation for back surgery.  His LDL 12/23 was noted to be 80.  He was noted to have 4.2 cm dilation of his thoracic aorta which was stable from 2017 through 2022.  It was felt to be difficult to evaluate for preoperative cardiac assessment/evaluation due to limited physical activity.  This was caused by his back pain.  It was not felt that he would be able to do ETT.  He reported having some chest discomfort.  Coronary CTA was ordered.  It was not completed.  He presents to the clinic today for follow-up evaluation and states***.  *** denies chest pain, shortness of breath, lower extremity edema, fatigue, palpitations, melena, hematuria, hemoptysis, diaphoresis, weakness, presyncope, syncope, orthopnea, and PND.  Essential hypertension-BP today***. Maintain blood pressure log Continue amlodipine, losartan Heart healthy low-sodium diet  Hyperlipidemia-LDL***. Continue atorvastatin High-fiber diet Increase physical activity as tolerated  Precordial pain-continues to note brief intermittent episodes of central chest discomfort and discomfort under his left breast which appears to be musculoskeletal in nature.  Pain is nonexertional. Coronary  CTA Order BMP, CBC  Thoracic aortic dilation-denies episodes of chest and back discomfort.  Noted to have 4.2 cm thoracic dilation which has been stable from 2017 through 2022.  Previously plan to recheck in conjunction with coronary CTA.  Preoperative cardiac evaluation-coronary CTA previously recommended due to inability to complete 4 METS of physical activity.  Reviewed  recommendation. Reorder coronary CTA/reschedule If coronary CTA is reassuring patient may proceed to surgery.  Disposition: Follow-up with Dr. Herbie Baltimore or me in 1-2 months.  Home Medications    Prior to Admission medications   Medication Sig Start Date End Date Taking? Authorizing Provider  amLODipine (NORVASC) 10 MG tablet Take 1 tablet (10 mg total) by mouth daily. 06/03/23   Anders Simmonds, PA-C  atorvastatin (LIPITOR) 10 MG tablet Take 1 tablet (10 mg total) by mouth daily. 06/03/23   Anders Simmonds, PA-C  cetirizine (ZYRTEC) 10 MG tablet Take 1 tablet (10 mg total) by mouth daily. 12/30/22   Marcine Matar, MD  ibuprofen (ADVIL) 600 MG tablet Take 1 tablet (600 mg total) by mouth once every 6 (six) hours as needed. 03/12/22   Fayrene Helper, PA-C  levothyroxine (SYNTHROID) 150 MCG tablet Take 1 tablet (150 mcg total) by mouth daily. 01/20/23   Marcine Matar, MD  lidocaine (LIDODERM) 5 % Place 1 patch onto the skin daily. Leave patch on for 12 hrs daily. 06/03/23   Anders Simmonds, PA-C  losartan (COZAAR) 50 MG tablet Take 1 tablet (50 mg total) by mouth daily. 06/03/23   Anders Simmonds, PA-C  meloxicam (MOBIC) 7.5 MG tablet Take 1 tablet (7.5 mg total) by mouth daily. 06/03/23   Anders Simmonds, PA-C  methocarbamol (ROBAXIN) 500 MG tablet Take 1 tablet (500 mg total) by mouth once nightly at bedtime as needed for muscle spasms. 03/12/22   Fayrene Helper, PA-C  metoprolol tartrate (LOPRESSOR) 100 MG tablet Take 1 tablet (100 mg total) by mouth once for 1 dose. TAKE TWO HOURS PRIOR TO  SCHEDULE CARDIAC TEST 03/29/23 03/29/23  Marykay Lex, MD  neomycin-polymyxin-hydrocortisone (CORTISPORIN) OTIC solution Place 4 drops into both ears 4 (four) times daily. 12/30/21   Marcine Matar, MD  ofloxacin (FLOXIN OTIC) 0.3 % OTIC solution Place 5 drops into both ears daily. 12/26/21   Daphine Deutscher, Mary-Margaret, FNP  pantoprazole (PROTONIX) 40 MG tablet Take 1 tablet (40 mg total) by mouth daily. 06/07/23    Marcine Matar, MD  tamsulosin (FLOMAX) 0.4 MG CAPS capsule Take 1 capsule (0.4 mg total) by mouth daily. 02/10/22   Marcine Matar, MD    Family History    Family History  Problem Relation Age of Onset   Cancer Mother        lung cancer   Lung cancer Mother    Cancer Father        lung   Stroke Father    Heart attack Father 62   Thyroid disease Sister    Cancer Maternal Aunt        stomach   Colon cancer Neg Hx    Esophageal cancer Neg Hx    He indicated that his mother is deceased. He indicated that his father is deceased. He indicated that the status of his sister is unknown. He indicated that the status of his maternal aunt is unknown. He indicated that the status of his neg hx is unknown.  Social History    Social History   Socioeconomic History   Marital status: Divorced  Spouse name: Not on file   Number of children: 2   Years of education: Not on file   Highest education level: Not on file  Occupational History   Occupation: Administrator, [employer]  Tobacco Use   Smoking status: Former    Current packs/day: 0.00    Average packs/day: 1 pack/day for 26.0 years (26.0 ttl pk-yrs)    Types: Cigarettes    Start date: 09/15/1978    Quit date: 09/15/2004    Years since quitting: 18.7   Smokeless tobacco: Never   Tobacco comments:    Quit in 2005  Vaping Use   Vaping status: Never Used  Substance and Sexual Activity   Alcohol use: Yes    Comment: ON WEEKENDS   Drug use: No   Sexual activity: Not Currently    Birth control/protection: None  Other Topics Concern   Not on file  Social History Narrative   Lives alone.     Social Determinants of Health   Financial Resource Strain: Not on file  Food Insecurity: Not on file  Transportation Needs: Not on file  Physical Activity: Not on file  Stress: Not on file  Social Connections: Not on file  Intimate Partner Violence: Not on file     Review of Systems    General:  No chills, fever, night sweats or  weight changes.  Cardiovascular:  No chest pain, dyspnea on exertion, edema, orthopnea, palpitations, paroxysmal nocturnal dyspnea. Dermatological: No rash, lesions/masses Respiratory: No cough, dyspnea Urologic: No hematuria, dysuria Abdominal:   No nausea, vomiting, diarrhea, bright red blood per rectum, melena, or hematemesis Neurologic:  No visual changes, wkns, changes in mental status. All other systems reviewed and are otherwise negative except as noted above.  Physical Exam    VS:  There were no vitals taken for this visit. , BMI There is no height or weight on file to calculate BMI. GEN: Well nourished, well developed, in no acute distress. HEENT: normal. Neck: Supple, no JVD, carotid bruits, or masses. Cardiac: RRR, no murmurs, rubs, or gallops. No clubbing, cyanosis, edema.  Radials/DP/PT 2+ and equal bilaterally.  Respiratory:  Respirations regular and unlabored, clear to auscultation bilaterally. GI: Soft, nontender, nondistended, BS + x 4. MS: no deformity or atrophy. Skin: warm and dry, no rash. Neuro:  Strength and sensation are intact. Psych: Normal affect.  Accessory Clinical Findings    Recent Labs: 09/15/2022: ALT 32 01/18/2023: Hemoglobin 15.0; Platelets 178; TSH 0.428 03/29/2023: BUN 14; Creatinine, Ser 0.65; Potassium 4.0; Sodium 143   Recent Lipid Panel    Component Value Date/Time   CHOL 150 09/15/2022 1105   TRIG 95 09/15/2022 1105   HDL 52 09/15/2022 1105   CHOLHDL 2.9 09/15/2022 1105   CHOLHDL 4.1 10/31/2013 1633   VLDL 34 10/31/2013 1633   LDLCALC 80 09/15/2022 1105    No BP recorded.  {Refresh Note OR Click here to enter BP  :1}***    ECG personally reviewed by me today- ***     ETT 10/22/2016   There was no ST segment deviation noted during stress.   Normal exercise treadmill stress test. No ischemia. Normal exercise capacity. Normal BP response to stress.     Assessment & Plan   1.  ***   Thomasene Ripple. Shakeyla Giebler NP-C      06/14/2023, 3:24 PM [facility] [address] Office [phone] Fax [phone]    I spent***minutes examining this patient, reviewing medications, and using patient centered shared  decision making involving her cardiac care.  Prior to her visit I spent greater than 20 minutes reviewing her past medical history,  medications, and prior cardiac tests.

## 2023-06-16 ENCOUNTER — Telehealth: Payer: Self-pay

## 2023-06-16 ENCOUNTER — Other Ambulatory Visit: Payer: Self-pay | Admitting: Physician Assistant

## 2023-06-16 ENCOUNTER — Ambulatory Visit: Payer: BC Managed Care – PPO | Admitting: General Practice

## 2023-06-16 ENCOUNTER — Telehealth: Payer: Self-pay | Admitting: *Deleted

## 2023-06-16 ENCOUNTER — Other Ambulatory Visit: Payer: Self-pay

## 2023-06-16 DIAGNOSIS — M79671 Pain in right foot: Secondary | ICD-10-CM

## 2023-06-16 NOTE — Telephone Encounter (Signed)
Pt was called and is aware of results, DOB was confirmed.  ?

## 2023-06-16 NOTE — Telephone Encounter (Signed)
Patient called requesting x ray results from 06/03/23. No result noted from PCP or provider regarding results. Patient also verbalized pain in foot remains and did not pick up prescribed meloxicam ordered during OV 06/03/23, recommended patient call pharmacy to see if he can still get Rx dispensed. Patient also c/o he does not have money for [facility] today . Gave # to patient to call [facility] to cancel appt and review financial needs with practice. Patient reports he can not get in My Chart , offered reset password information and gave # [phone] for future needs with issues with My Chart. Patient requesting a call back regarding x ray results.

## 2023-06-16 NOTE — Telephone Encounter (Signed)
-----   Message from Georgian Co sent at 06/16/2023  1:41 PM EDT ----- Your xray of your foot was normal and did not show a stress fracture.  Use ice if it is still swelling and make sure your shoes are comfortable.  Thanks, [employer], PA-C

## 2023-06-23 ENCOUNTER — Telehealth: Payer: Self-pay | Admitting: Podiatry

## 2023-06-23 ENCOUNTER — Other Ambulatory Visit: Payer: Self-pay | Admitting: Internal Medicine

## 2023-06-23 ENCOUNTER — Other Ambulatory Visit: Payer: Self-pay

## 2023-06-23 DIAGNOSIS — E039 Hypothyroidism, unspecified: Secondary | ICD-10-CM

## 2023-06-23 MED ORDER — LEVOTHYROXINE SODIUM 150 MCG PO TABS
150.0000 ug | ORAL_TABLET | Freq: Every day | ORAL | 0 refills | Status: DC
  Filled 2023-07-28: qty 30, 30d supply, fill #0

## 2023-06-23 NOTE — Progress Notes (Signed)
Cardiology Clinic Note   Patient Name: Darren [name] Date of Encounter: 06/25/2023  Primary Care Provider:  Marcine Matar, MD Primary Cardiologist:  Bryan Lemma, MD  Patient Profile    62 year old male with history of hyperlipidemia, hypertension, dilatation of the thoracic aorta, CAD with no symptoms, plan for coronary CTA which is pending from 03/29/2023.  Last seen by Dr. Herbie Baltimore on 03/29/2023.   Past Medical History    Past Medical History:  Diagnosis Date   Arthritis    per Medical clearance form.   Barrett esophagus 2001   path report in EPIC 2001 c/w Barrett's. endoscopy report unavailable.   COPD (chronic obstructive pulmonary disease) (HCC)    per medical clearance form.   Depression    per Medical Clearance form(09/18/11)   Dilatation of thoracic aorta (HCC) 06/2016   4.2 cm   GERD (gastroesophageal reflux disease)    per Medical clearance form.   Hypertension    Per Medical Clearance form.   Hypothyroidism    per Medical Clearance form.   Past Surgical History:  Procedure Laterality Date   BIOPSY  09/18/2016   Procedure: BIOPSY;  Surgeon: Corbin Ade, MD;  [facility]: [facility];  Service: Endoscopy;;  biopsy of distal esophagus   ESOPHAGOGASTRODUODENOSCOPY  2001   barrett's per path   ESOPHAGOGASTRODUODENOSCOPY (EGD) WITH PROPOFOL N/A 09/18/2016   Procedure: ESOPHAGOGASTRODUODENOSCOPY (EGD) WITH PROPOFOL;  Surgeon: Corbin Ade, MD;  Location: [facility];  Service: Endoscopy;  Laterality: N/A;  1200   GRADUATED EXERCISE TREADMILL STRESS TEST  09/2016   Exercised 6:33 min.  Reached 117 bpm (7% MPHR 165 bpm).  7.8 METS.  No ischemic EKG changes.  Stopped due to leg fatigue from back pain.  Noted dyspnea.  No chest pain. => Negative symptom limited ETT.   HEMORRHOID SURGERY     1982   MULTIPLE EXTRACTIONS WITH ALVEOLOPLASTY  11/30/2011   Procedure: MULTIPLE EXTRACION WITH ALVEOLOPLASTY;  Surgeon: Georgia Lopes, DDS;  Location: [facility];  Service:  Oral Surgery;  Laterality: Bilateral;   TRANSTHORACIC ECHOCARDIOGRAM  09/2016   Normal LV size and function.  EF 55 to 60%.  No RWMA.  Normal valves.  No comment on aorta.    Allergies  No Known Allergies  History of Present Illness    Darren [name] returns to the office today for ongoing assessment and management of hypertension, also history of ascending aortic aneurysm, and hyperlipidemia.  On last office visit the patient was scheduled for coronary CTA which was not completed due to cost of co-pay.  Blood pressure has been fairly well-controlled he has been medically compliant he continues to have some GERD symptoms.  Home Medications    Current Outpatient Medications  Medication Sig Dispense Refill   amLODipine (NORVASC) 10 MG tablet Take 1 tablet (10 mg total) by mouth daily. 90 tablet 1   atorvastatin (LIPITOR) 10 MG tablet Take 1 tablet (10 mg total) by mouth daily. 90 tablet 2   cetirizine (ZYRTEC) 10 MG tablet Take 1 tablet (10 mg total) by mouth daily. 90 tablet 0   ibuprofen (ADVIL) 600 MG tablet Take 1 tablet (600 mg total) by mouth once every 6 (six) hours as needed. 30 tablet 0   levothyroxine (SYNTHROID) 150 MCG tablet Take 1 tablet (150 mcg total) by mouth daily. Patient needs updated TSH/labs. Please direct him to make an appt. 30 tablet 0   lidocaine (LIDODERM) 5 % Place 1 patch onto the skin daily. Leave  patch on for 12 hrs daily. 30 patch 3   losartan (COZAAR) 50 MG tablet Take 1 tablet (50 mg total) by mouth daily. 90 tablet 1   meloxicam (MOBIC) 7.5 MG tablet Take 1 tablet (7.5 mg total) by mouth daily. 30 tablet 1   metoprolol tartrate (LOPRESSOR) 25 MG tablet Take 1 tablet (25 mg total) by mouth once for 1 dose. TAKE ONE TABLET 90 MINUTES TO TWO HOURS PRIOR TO CT SCAN. 1 tablet 0   neomycin-polymyxin-hydrocortisone (CORTISPORIN) OTIC solution Place 4 drops into both ears 4 (four) times daily. 10 mL 0   ofloxacin (FLOXIN OTIC) 0.3 % OTIC solution Place 5 drops into both  ears daily. 10 mL 0   pantoprazole (PROTONIX) 40 MG tablet Take 1 tablet (40 mg total) by mouth daily. 30 tablet 3   tamsulosin (FLOMAX) 0.4 MG CAPS capsule Take 1 capsule (0.4 mg total) by mouth daily. 30 capsule 3   methocarbamol (ROBAXIN) 500 MG tablet Take 1 tablet (500 mg total) by mouth once nightly at bedtime as needed for muscle spasms. (Patient not taking: Reported on 06/25/2023) 20 tablet 0   metoprolol tartrate (LOPRESSOR) 100 MG tablet Take 1 tablet (100 mg total) by mouth once for 1 dose. TAKE TWO HOURS PRIOR TO  SCHEDULE CARDIAC TEST 1 tablet 0   No current facility-administered medications for this visit.     Family History    Family History  Problem Relation Age of Onset   Cancer Mother        lung cancer   Lung cancer Mother    Cancer Father        lung   Stroke Father    Heart attack Father 49   Thyroid disease Sister    Cancer Maternal Aunt        stomach   Colon cancer Neg Hx    Esophageal cancer Neg Hx    He indicated that his mother is deceased. He indicated that his father is deceased. He indicated that the status of his sister is unknown. He indicated that the status of his maternal aunt is unknown. He indicated that the status of his neg hx is unknown.  Social History    Social History   Socioeconomic History   Marital status: Divorced    Spouse name: Not on file   Number of children: 2   Years of education: Not on file   Highest education level: Not on file  Occupational History   Occupation: Administrator, [employer] Service  Tobacco Use   Smoking status: Former    Current packs/day: 0.00    Average packs/day: 1 pack/day for 26.0 years (26.0 ttl pk-yrs)    Types: Cigarettes    Start date: 09/15/1978    Quit date: 09/15/2004    Years since quitting: 18.7   Smokeless tobacco: Never   Tobacco comments:    Quit in 2005  Vaping Use   Vaping status: Never Used  Substance and Sexual Activity   Alcohol use: Yes    Comment: ON WEEKENDS   Drug use: No   Sexual  activity: Not Currently    Birth control/protection: None  Other Topics Concern   Not on file  Social History Narrative   Lives alone.     Social Determinants of Health   Financial Resource Strain: Not on file  Food Insecurity: Not on file  Transportation Needs: Not on file  Physical Activity: Not on file  Stress: Not on file  Social Connections: Not on file  Intimate Partner Violence: Not on file     Review of Systems    General:  No chills, fever, night sweats or weight changes.  Cardiovascular:  No chest pain, dyspnea on exertion, edema, orthopnea, palpitations, paroxysmal nocturnal dyspnea. Dermatological: No rash, lesions/masses Respiratory: No cough, dyspnea Urologic: No hematuria, dysuria Abdominal:   No nausea, vomiting, diarrhea, bright red blood per rectum, melena, or hematemesis Neurologic:  No visual changes, wkns, changes in mental status. All other systems reviewed and are otherwise negative except as noted above.       Physical Exam    VS:  BP 124/78 (BP Location: Left Arm, Patient Position: Sitting, Cuff Size: Normal)   Pulse 68   Ht 5\' 4"  (1.626 m)   Wt 183 lb 12.8 oz (83.4 kg)   SpO2 96%   BMI 31.55 kg/m  , BMI Body mass index is 31.55 kg/m.     GEN: Well nourished, well developed, in no acute distress. HEENT: normal.  Some missing teeth. Neck: Supple, no JVD, carotid bruits, or masses. Cardiac: RRR, no murmurs, rubs, or gallops. No clubbing, cyanosis, edema.  No abdominal bruits.  Radials/DP/PT 2+ and equal bilaterally.  Respiratory:  Respirations regular and unlabored, clear to auscultation bilaterally. GI: Soft, nontender, nondistended, BS + x 4. MS: no deformity or atrophy. Skin: warm and dry, no rash. Neuro:  Strength and sensation are intact. Psych: Normal affect.      Lab Results  Component Value Date   WBC 17.6 (H) 01/18/2023   HGB 15.0 01/18/2023   HCT 44.7 01/18/2023   MCV 82 01/18/2023   PLT 178 01/18/2023   Lab Results   Component Value Date   CREATININE 0.65 (L) 03/29/2023   BUN 14 03/29/2023   NA 143 03/29/2023   K 4.0 03/29/2023   CL 107 (H) 03/29/2023   CO2 24 03/29/2023   Lab Results  Component Value Date   ALT 32 09/15/2022   AST 22 09/15/2022   ALKPHOS 85 09/15/2022   BILITOT 0.4 09/15/2022   Lab Results  Component Value Date   CHOL 150 09/15/2022   HDL 52 09/15/2022   LDLCALC 80 09/15/2022   TRIG 95 09/15/2022   CHOLHDL 2.9 09/15/2022    Lab Results  Component Value Date   HGBA1C 5.9 (H) 01/18/2023     Review of Prior Studies    Echocardiogram 09/30/2022 Left ventricle: The cavity size was normal. There was mild    concentric hypertrophy. Systolic function was normal. The    estimated ejection fraction was in the range of 55% to 60%. Wall    motion was normal; there were no regional wall motion    abnormalities. Left ventricular diastolic function parameters    were normal.   Assessment & Plan   1.  Hypertension: Blood pressure is very well-controlled currently without complaints of dizziness, or chest pressure.  Continue current medication regimen which includes losartan 50 mg daily and amlodipine 10 mg daily.  C-Met is ordered.  2.  Chest pressure: The patient was ordered coronary CTA in July 2024 but was unable to afford co-pay.  This will be reordered for October.  He will continue PPI.  Report any significant chest discomfort.  3.  Hypercholesterolemia: He remains on atorvastatin 10 mg daily.  The patient will have fasting lipid panel drawn sometime next week along with a CMET and CBC.  4.  Hypothyroidism: He is now on levothyroxine 150 mcg daily.  He has just gotten refills.  Lab work  was to be drawn by PCP but she he was unable to stay to have it done.  We will go ahead and add the TSH to his labs.  These will be forwarded to Dr. Jonah Blue for their documentation.        Signed, Bettey Mare. Liborio Nixon, ANP, AACC   06/25/2023 3:04 PM      Office  [phone] Fax [phone]  Notice: This dictation was prepared with Dragon dictation along with smaller phrase technology. Any transcriptional errors that result from this process are unintentional and may not be corrected upon review.  This over there she was having issues with blood pressure

## 2023-06-23 NOTE — Telephone Encounter (Signed)
Pt called and was upset because he has been on hold yesterday and today and finally got thru. Stated phone was busy earlier. He stated he has an appt today but does not have his copay until next week. I explained that our policy is we would have to collect it at the appt for new patients. He said his foot has been killing him and we don't care about anything but the money. I apologized to him and explained that is the policy.  He said he would find somewhere else but never told me to cancel the appt.He got mad and started using profanity and I asked him to please not cuss at me and before I finished he hung up.   He would have a 60.00 copay per his insurance.

## 2023-06-24 ENCOUNTER — Ambulatory Visit: Payer: BC Managed Care – PPO | Admitting: Podiatry

## 2023-06-24 ENCOUNTER — Other Ambulatory Visit: Payer: Self-pay

## 2023-06-25 ENCOUNTER — Ambulatory Visit: Payer: BC Managed Care – PPO | Attending: General Practice | Admitting: Adult Health

## 2023-06-25 ENCOUNTER — Other Ambulatory Visit: Payer: Self-pay

## 2023-06-25 ENCOUNTER — Encounter: Payer: Self-pay | Admitting: Adult Health

## 2023-06-25 ENCOUNTER — Other Ambulatory Visit (HOSPITAL_COMMUNITY): Payer: Self-pay

## 2023-06-25 VITALS — BP 124/78 | HR 68 | Ht 64.0 in | Wt 183.8 lb

## 2023-06-25 DIAGNOSIS — I2510 Atherosclerotic heart disease of native coronary artery without angina pectoris: Secondary | ICD-10-CM

## 2023-06-25 MED ORDER — METOPROLOL TARTRATE 25 MG PO TABS
25.0000 mg | ORAL_TABLET | Freq: Once | ORAL | 0 refills | Status: DC
Start: 2023-06-25 — End: 2023-11-10
  Filled 2023-06-25 (×2): qty 1, 1d supply, fill #0

## 2023-06-25 NOTE — Patient Instructions (Signed)
Medication Instructions:  NO CHANGES    Lab Work: FASTING LIPID PROFILE CMET CBC TSH   Testing/Procedures:   Your cardiac CT will be scheduled at one of the below locations:   [facility] [address] [place], [place] [address] [phone]  OR   If scheduled at [facility], please arrive at the [facility] and Children's Entrance (Entrance C2) of [facility] 30 minutes prior to test start time. You can use the FREE valet parking offered at entrance C (encouraged to control the heart rate for the test)  Proceed to the [facility] Radiology Department (first floor) to check-in and test prep.  All radiology patients and guests should use entrance C2 at [facility], accessed from [facility], even though the hospital's physical address listed is [address]    If scheduled at [facility] or [facility], please arrive 15 mins early for check-in and test prep.  There is spacious parking and easy access to the radiology department from the [facility] Heart and Vascular entrance. Please enter here and check-in with the desk attendant.   Please follow these instructions carefully (unless otherwise directed):  An IV will be required for this test and Nitroglycerin will be given.  Hold all erectile dysfunction medications at least 3 days (72 hrs) prior to test. (Ie viagra, cialis, sildenafil, tadalafil, etc)   On the Night Before the Test: Be sure to Drink plenty of water. Do not consume any caffeinated/decaffeinated beverages or chocolate 12 hours prior to your test. Do not take any antihistamines 12 hours prior to your test. If the patient has contrast allergy:  On the Day of the Test: Drink plenty of water until 1 hour prior to the test. Do not eat any food 1 hour prior to test. You may take your regular medications prior to the test.  Take metoprolol (Lopressor) two hours  prior to test. If you take Furosemide/Hydrochlorothiazide/Spironolactone, please HOLD on the morning of the test.       After the Test: Drink plenty of water. After receiving IV contrast, you may experience a mild flushed feeling. This is normal. On occasion, you may experience a mild rash up to 24 hours after the test. This is not dangerous. If this occurs, you can take Benadryl 25 mg and increase your fluid intake. If you experience trouble breathing, this can be serious. If it is severe call 911 IMMEDIATELY. If it is mild, please call our office. If you take any of these medications: Glipizide/Metformin, Avandament, Glucavance, please do not take 48 hours after completing test unless otherwise instructed.  We will call to schedule your test 2-4 weeks out understanding that some insurance companies will need an authorization prior to the service being performed.   For more information and frequently asked questions, please visit our website : [email or web address]  For non-scheduling related questions, please contact the cardiac imaging nurse navigator should you have any questions/concerns: Cardiac Imaging Nurse Navigators Direct Office Dial: [phone]   For scheduling needs, including cancellations and rescheduling, please call [place], [phone].    Follow-Up: At [facility], you and your health needs are our priority.  As part of our continuing mission to provide you with exceptional heart care, we have created designated Provider Care Teams.  These Care Teams include your primary Cardiologist (physician) and Advanced Practice Providers (APPs -  Physician Assistants and Nurse Practitioners) who all work together to provide you with the  care you need, when you need it.    Your next appointment:   1 YEAR   Provider:   Bryan Lemma, MD

## 2023-07-16 ENCOUNTER — Encounter (HOSPITAL_COMMUNITY): Payer: Self-pay

## 2023-07-19 ENCOUNTER — Telehealth (HOSPITAL_COMMUNITY): Payer: Self-pay | Admitting: *Deleted

## 2023-07-19 NOTE — Telephone Encounter (Signed)
Attempted to call patient regarding upcoming cardiac CT appointment. Left message on voicemail with name and callback number Hayley Sharpe RN Navigator Cardiac Imaging [facility] [phone] Office   

## 2023-07-19 NOTE — Telephone Encounter (Signed)
 Received call from patient regarding upcoming cardiac imaging study; pt verbalizes understanding of appt date/time, parking situation and where to check in, pre-test NPO status and medications ordered, and verified current allergies; name and call back number provided for further questions should they arise Johney Frame RN Navigator Cardiac Imaging [facility] [phone] office [phone] cell

## 2023-07-20 ENCOUNTER — Ambulatory Visit (HOSPITAL_COMMUNITY)
Admission: RE | Admit: 2023-07-20 | Discharge: 2023-07-20 | Disposition: A | Discharge status: Home / Self Care | Payer: BC Managed Care – PPO | Source: Ambulatory Visit | Attending: General Practice | Admitting: General Practice

## 2023-07-20 DIAGNOSIS — I2510 Atherosclerotic heart disease of native coronary artery without angina pectoris: Secondary | ICD-10-CM | POA: Insufficient documentation

## 2023-07-20 MED ORDER — NITROGLYCERIN 0.4 MG SL SUBL
SUBLINGUAL_TABLET | SUBLINGUAL | Status: AC
  Filled 2023-07-20: qty 2

## 2023-07-20 MED ORDER — NITROGLYCERIN 0.4 MG SL SUBL
0.8000 mg | SUBLINGUAL_TABLET | Freq: Once | SUBLINGUAL | Status: AC
Start: 2023-07-20 — End: 2023-07-20
  Administered 2023-07-20: 0.8 mg via SUBLINGUAL

## 2023-07-20 MED ORDER — IOHEXOL 350 MG/ML SOLN
100.0000 mL | Freq: Once | INTRAVENOUS | Status: AC | PRN
  Administered 2023-07-20: 100 mL via INTRAVENOUS

## 2023-07-21 ENCOUNTER — Other Ambulatory Visit (HOSPITAL_COMMUNITY): Payer: Self-pay

## 2023-07-21 DIAGNOSIS — R079 Chest pain, unspecified: Secondary | ICD-10-CM

## 2023-07-26 ENCOUNTER — Other Ambulatory Visit (HOSPITAL_COMMUNITY): Payer: Self-pay

## 2023-07-28 ENCOUNTER — Other Ambulatory Visit: Payer: Self-pay

## 2023-07-28 ENCOUNTER — Other Ambulatory Visit: Payer: Self-pay | Admitting: Internal Medicine

## 2023-07-28 DIAGNOSIS — E039 Hypothyroidism, unspecified: Secondary | ICD-10-CM

## 2023-07-28 NOTE — Telephone Encounter (Signed)
Pt requests 90 day supply of the following:  Medication Refill - Medication: levothyroxine (SYNTHROID) 150 MCG tablet   Has the patient contacted their pharmacy? Yes.    Preferred Pharmacy (with phone number or street name):  [facility] Phone: [phone]  Fax: [phone]     Has the patient been seen for an appointment in the last year OR does the patient have an upcoming appointment? Yes.    Agent: Please be advised that RX refills may take up to 3 business days. We ask that you follow-up with your pharmacy.

## 2023-07-29 ENCOUNTER — Encounter: Payer: Self-pay | Admitting: *Deleted

## 2023-07-29 ENCOUNTER — Other Ambulatory Visit: Payer: Self-pay

## 2023-07-29 MED ORDER — LEVOTHYROXINE SODIUM 150 MCG PO TABS
150.0000 ug | ORAL_TABLET | Freq: Every day | ORAL | 1 refills | Status: DC
  Filled 2023-07-29 (×2): qty 90, 90d supply, fill #0

## 2023-07-29 NOTE — Telephone Encounter (Signed)
Requested medication (s) are due for refill today:   Yes  Requested medication (s) are on the active medication list:   Yes  Future visit scheduled:   No    Sent a MyChart message to call in and make an appt. For yearly physical   Last ordered: 06/23/2023 #30, 0 refills as a courtesy refill.  Returned because courtesy refill has been given in Sept.     Requested Prescriptions  Pending Prescriptions Disp Refills   levothyroxine (SYNTHROID) 150 MCG tablet 30 tablet 0    Sig: Take 1 tablet (150 mcg total) by mouth daily. Patient needs updated TSH/labs. Please direct him to make an appt.     Endocrinology:  Hypothyroid Agents Failed - 07/28/2023  2:44 PM      Failed - TSH in normal range and within 360 days    TSH  Date Value Ref Range Status  01/18/2023 0.428 (L) 0.450 - 4.500 uIU/mL Final         Passed - Valid encounter within last 12 months    Recent Outpatient Visits           1 month ago Prediabetes   [facility] [place], [place], [place]   6 months ago Preoperative examination   [facility] Marcine Matar, MD   10 months ago Annual physical exam   [facility] Marcine Matar, MD   1 year ago Lumbar strain, subsequent encounter   [facility] Marcine Matar, MD   1 year ago Essential hypertension    [facility] Marcine Matar, MD

## 2023-08-20 ENCOUNTER — Other Ambulatory Visit: Payer: Self-pay

## 2023-08-30 ENCOUNTER — Other Ambulatory Visit: Payer: Self-pay

## 2023-08-30 ENCOUNTER — Other Ambulatory Visit: Payer: Self-pay | Admitting: Internal Medicine

## 2023-08-30 DIAGNOSIS — K219 Gastro-esophageal reflux disease without esophagitis: Secondary | ICD-10-CM

## 2023-08-30 MED ORDER — PANTOPRAZOLE SODIUM 40 MG PO TBEC
40.0000 mg | DELAYED_RELEASE_TABLET | Freq: Every day | ORAL | 0 refills | Status: DC
  Filled 2023-08-30: qty 90, 90d supply, fill #0

## 2023-08-31 ENCOUNTER — Other Ambulatory Visit: Payer: Self-pay

## 2023-09-03 ENCOUNTER — Other Ambulatory Visit: Payer: Self-pay

## 2023-09-20 ENCOUNTER — Other Ambulatory Visit (HOSPITAL_COMMUNITY): Payer: Self-pay

## 2023-09-20 ENCOUNTER — Other Ambulatory Visit: Payer: Self-pay

## 2023-10-06 ENCOUNTER — Other Ambulatory Visit: Payer: Self-pay

## 2023-10-06 ENCOUNTER — Other Ambulatory Visit (HOSPITAL_COMMUNITY): Payer: Self-pay

## 2023-10-25 ENCOUNTER — Other Ambulatory Visit: Payer: Self-pay | Admitting: Internal Medicine

## 2023-10-25 DIAGNOSIS — E039 Hypothyroidism, unspecified: Secondary | ICD-10-CM

## 2023-10-25 MED ORDER — LEVOTHYROXINE SODIUM 150 MCG PO TABS
150.0000 ug | ORAL_TABLET | Freq: Every day | ORAL | 0 refills | Status: DC
  Filled 2023-10-25: qty 90, 90d supply, fill #0

## 2023-10-25 MED ORDER — LIDOCAINE 5 % EX PTCH
1.0000 | MEDICATED_PATCH | CUTANEOUS | 3 refills | Status: DC
  Filled 2023-10-25 – 2023-10-29 (×4): qty 30, 30d supply, fill #0
  Filled 2023-12-07: qty 30, 30d supply, fill #1
  Filled 2024-01-04: qty 30, 30d supply, fill #2
  Filled 2024-01-28 (×2): qty 30, 30d supply, fill #3

## 2023-10-25 NOTE — Telephone Encounter (Signed)
Copied from CRM [id number]. Topic: Clinical - Prescription Issue >> Oct 25, 2023  2:15 PM Dominique A wrote: Reason for CRM: Patient states that he called his Pharmacy and their is no refills for is medication: lidocaine (LIDODERM) 5 % Patient states that every time he has to get a refill for this medication it is always no refills and his PCP knows that he needs this medication due to his issues with back pain. Patient states that he works 12 hr shifts and bends and has to pick up things and has to have his patches on his back to make it through a shift. Patient states that he does not have $1700 co pay for back surgery. Please advise.   [facility] [address] [place] [address] Phone: [phone] Fax: [phone] Hours: M-F 7:30a-6:00p

## 2023-10-25 NOTE — Telephone Encounter (Signed)
Copied from CRM [id number]. Topic: Clinical - Medication Refill >> Oct 25, 2023  2:18 PM Dondra Prader A wrote: Most Recent Primary Care Visit:  Provider: Anders Simmonds  Department: [facility]  Visit Type: OFFICE VISIT  Date: 06/03/2023  Medication: levothyroxine (SYNTHROID) 150 MCG tablet  Has the patient contacted their pharmacy? No  Is this the correct pharmacy for this prescription? Yes  This is the patient's preferred pharmacy:  [facility] [address], [address] [place] [address] Phone: [phone] Fax: [phone]   Has the prescription been filled recently? No  Is the patient out of the medication? No  Has the patient been seen for an appointment in the last year OR does the patient have an upcoming appointment? Yes  Can we respond through MyChart? No  Agent: Please be advised that Rx refills may take up to 3 business days. We ask that you follow-up with your pharmacy.

## 2023-10-26 ENCOUNTER — Other Ambulatory Visit: Payer: Self-pay

## 2023-10-29 ENCOUNTER — Other Ambulatory Visit: Payer: Self-pay

## 2023-11-09 ENCOUNTER — Other Ambulatory Visit: Payer: Self-pay

## 2023-11-09 ENCOUNTER — Ambulatory Visit: Payer: BC Managed Care – PPO | Attending: Internal Medicine | Admitting: Internal Medicine

## 2023-11-09 ENCOUNTER — Encounter: Payer: Self-pay | Admitting: Internal Medicine

## 2023-11-09 VITALS — BP 124/81 | HR 77 | Wt 186.8 lb

## 2023-11-09 DIAGNOSIS — D170 Benign lipomatous neoplasm of skin and subcutaneous tissue of head, face and neck: Secondary | ICD-10-CM

## 2023-11-09 DIAGNOSIS — E039 Hypothyroidism, unspecified: Secondary | ICD-10-CM

## 2023-11-09 DIAGNOSIS — I10 Essential (primary) hypertension: Secondary | ICD-10-CM | POA: Diagnosis not present

## 2023-11-09 DIAGNOSIS — Z23 Encounter for immunization: Secondary | ICD-10-CM

## 2023-11-09 DIAGNOSIS — E782 Mixed hyperlipidemia: Secondary | ICD-10-CM

## 2023-11-09 DIAGNOSIS — C9110 Chronic lymphocytic leukemia of B-cell type not having achieved remission: Secondary | ICD-10-CM

## 2023-11-09 DIAGNOSIS — K439 Ventral hernia without obstruction or gangrene: Secondary | ICD-10-CM

## 2023-11-09 DIAGNOSIS — K219 Gastro-esophageal reflux disease without esophagitis: Secondary | ICD-10-CM

## 2023-11-09 DIAGNOSIS — J439 Emphysema, unspecified: Secondary | ICD-10-CM

## 2023-11-09 DIAGNOSIS — Z1211 Encounter for screening for malignant neoplasm of colon: Secondary | ICD-10-CM

## 2023-11-09 MED ORDER — LEVOTHYROXINE SODIUM 150 MCG PO TABS
150.0000 ug | ORAL_TABLET | Freq: Every day | ORAL | 1 refills | Status: DC
  Filled 2023-11-09 – 2024-01-28 (×2): qty 90, 90d supply, fill #0
  Filled 2024-04-19: qty 90, 90d supply, fill #1

## 2023-11-09 MED ORDER — ALBUTEROL SULFATE HFA 108 (90 BASE) MCG/ACT IN AERS
2.0000 | INHALATION_SPRAY | Freq: Four times a day (QID) | RESPIRATORY_TRACT | 2 refills | Status: AC | PRN
  Filled 2023-11-09 – 2023-11-12 (×2): qty 8.5, 25d supply, fill #0

## 2023-11-09 MED ORDER — ZOSTER VAC RECOMB ADJUVANTED 50 MCG/0.5ML IM SUSR: 0.5000 mL | Freq: Once | INTRAMUSCULAR | 0 refills | Status: AC

## 2023-11-09 MED ORDER — PANTOPRAZOLE SODIUM 40 MG PO TBEC
40.0000 mg | DELAYED_RELEASE_TABLET | Freq: Every day | ORAL | 1 refills | Status: DC
  Filled 2023-11-09 – 2023-12-07 (×2): qty 90, 90d supply, fill #0
  Filled 2024-03-13 (×2): qty 90, 90d supply, fill #1

## 2023-11-09 NOTE — Progress Notes (Unsigned)
Patient ID: Darren [name], male    DOB: June 13, 1961  MRN: [id number]  CC: Medication Refill   Subjective: Darren [name] is a 63 y.o. male who presents for chronic ds management. His concerns today include:  Pt with hx of GERD, hypothyroid, HTN, HL, centrilobular emphysema, CLL followed by Dr. Lyna Poser, ascending aortic aneurysm (followed by vascular), chronic LBP and Barrett's esophagus.   Hx of COVID infection.   Hypothyroid: taking Levothyroxine 150 mcg daily as prescribed  HTN/CAD: taking Norvasc 10 mg and Cozaar 50 mg daily HL:   Emphysema:  doing good except depending on environment.  Dores not have inhaler Quit smoking 2005  CLL  Patient Active Problem List   Diagnosis Date Noted   Preop cardiovascular exam 04/01/2023   Precordial pain 03/29/2023   Prediabetes 02/11/2022   Infective otitis externa of both ears 10/08/2021   Seasonal allergies 10/08/2021   Chronic lymphocytic leukemia (HCC) 09/05/2021   Ascending aortic aneurysm (HCC) 01/16/2021   Hyperlipidemia 12/09/2020   Lipoma of neck 11/24/2019   Class 1 obesity due to excess calories without serious comorbidity with body mass index (BMI) of 31.0 to 31.9 in adult 11/04/2017   Disorder of left sacroiliac joint 12/21/2016   Spondylosis without myelopathy or radiculopathy, lumbar region 12/21/2016   Lumbar degenerative disc disease 12/21/2016   COPD (chronic obstructive pulmonary disease) with emphysema (HCC) 07/31/2016   Essential hypertension 07/31/2016   Dilation of thoracic aorta (HCC) 07/31/2016   Esophageal dysphagia 07/09/2016   Chronic midline low back pain 07/09/2016   Sensorineural hearing loss (SNHL), bilateral 03/30/2016   Vitamin D deficiency 04/06/2014   Hypothyroidism 10/31/2013   GERD (gastroesophageal reflux disease) 11/25/2011   Barrett esophagus 09/29/1999     Current Outpatient Medications on File Prior to Visit  Medication Sig Dispense Refill   amLODipine (NORVASC) 10 MG tablet Take 1 tablet  (10 mg total) by mouth daily. 90 tablet 1   atorvastatin (LIPITOR) 10 MG tablet Take 1 tablet (10 mg total) by mouth daily. 90 tablet 2   levothyroxine (SYNTHROID) 150 MCG tablet Take 1 tablet (150 mcg total) by mouth daily. 90 tablet 0   lidocaine (LIDODERM) 5 % Place 1 patch onto the skin daily. Leave patch on for 12 hrs daily. 30 patch 3   losartan (COZAAR) 50 MG tablet Take 1 tablet (50 mg total) by mouth daily. 90 tablet 1   pantoprazole (PROTONIX) 40 MG tablet Take 1 tablet (40 mg total) by mouth daily. 90 tablet 0   cetirizine (ZYRTEC) 10 MG tablet Take 1 tablet (10 mg total) by mouth daily. (Patient not taking: Reported on 11/09/2023) 90 tablet 0   ibuprofen (ADVIL) 600 MG tablet Take 1 tablet (600 mg total) by mouth once every 6 (six) hours as needed. (Patient not taking: Reported on 11/09/2023) 30 tablet 0   meloxicam (MOBIC) 7.5 MG tablet Take 1 tablet (7.5 mg total) by mouth daily. (Patient not taking: Reported on 11/09/2023) 30 tablet 1   methocarbamol (ROBAXIN) 500 MG tablet Take 1 tablet (500 mg total) by mouth once nightly at bedtime as needed for muscle spasms. (Patient not taking: Reported on 11/09/2023) 20 tablet 0   metoprolol tartrate (LOPRESSOR) 100 MG tablet Take 1 tablet (100 mg total) by mouth once for 1 dose. TAKE TWO HOURS PRIOR TO  SCHEDULE CARDIAC TEST 1 tablet 0   metoprolol tartrate (LOPRESSOR) 25 MG tablet Take 1 tablet (25 mg total) by mouth once for 1 dose. TAKE ONE TABLET 90  MINUTES TO TWO HOURS PRIOR TO CT SCAN. 1 tablet 0   neomycin-polymyxin-hydrocortisone (CORTISPORIN) OTIC solution Place 4 drops into both ears 4 (four) times daily. (Patient not taking: Reported on 11/09/2023) 10 mL 0   ofloxacin (FLOXIN OTIC) 0.3 % OTIC solution Place 5 drops into both ears daily. (Patient not taking: Reported on 11/09/2023) 10 mL 0   tamsulosin (FLOMAX) 0.4 MG CAPS capsule Take 1 capsule (0.4 mg total) by mouth daily. (Patient not taking: Reported on 11/09/2023) 30 capsule 3   No  current facility-administered medications on file prior to visit.    No Known Allergies  Social History   Socioeconomic History   Marital status: Divorced    Spouse name: Not on file   Number of children: 2   Years of education: Not on file   Highest education level: Not on file  Occupational History   Occupation: Administrator, [employer]  Tobacco Use   Smoking status: Former    Current packs/day: 0.00    Average packs/day: 1 pack/day for 26.0 years (26.0 ttl pk-yrs)    Types: Cigarettes    Start date: 09/15/1978    Quit date: 09/15/2004    Years since quitting: 19.1   Smokeless tobacco: Never   Tobacco comments:    Quit in 2005  Vaping Use   Vaping status: Never Used  Substance and Sexual Activity   Alcohol use: Yes    Comment: ON WEEKENDS   Drug use: No   Sexual activity: Not Currently    Birth control/protection: None  Other Topics Concern   Not on file  Social History Narrative   Lives alone.     Social Drivers of Corporate investment banker Strain: Not on file  Food Insecurity: Not on file  Transportation Needs: Not on file  Physical Activity: Not on file  Stress: Not on file  Social Connections: Not on file  Intimate Partner Violence: Not on file    Family History  Problem Relation Age of Onset   Cancer Mother        lung cancer   Lung cancer Mother    Cancer Father        lung   Stroke Father    Heart attack Father 7   Thyroid disease Sister    Cancer Maternal Aunt        stomach   Colon cancer Neg Hx    Esophageal cancer Neg Hx     Past Surgical History:  Procedure Laterality Date   BIOPSY  09/18/2016   Procedure: BIOPSY;  Surgeon: Corbin Ade, MD;  [facility];  Service: Endoscopy;;  biopsy of distal esophagus   ESOPHAGOGASTRODUODENOSCOPY  2001   barrett's per path   ESOPHAGOGASTRODUODENOSCOPY (EGD) WITH PROPOFOL N/A 09/18/2016   Procedure: ESOPHAGOGASTRODUODENOSCOPY (EGD) WITH PROPOFOL;  Surgeon: Corbin Ade, MD;  [facility];  Service: Endoscopy;  Laterality: N/A;  1200   GRADUATED EXERCISE TREADMILL STRESS TEST  09/2016   Exercised 6:33 min.  Reached 117 bpm (7% MPHR 165 bpm).  7.8 METS.  No ischemic EKG changes.  Stopped due to leg fatigue from back pain.  Noted dyspnea.  No chest pain. => Negative symptom limited ETT.   HEMORRHOID SURGERY     1982   MULTIPLE EXTRACTIONS WITH ALVEOLOPLASTY  11/30/2011   Procedure: MULTIPLE EXTRACION WITH ALVEOLOPLASTY;  Surgeon: Georgia Lopes, DDS;  Location: [facility];  Service: Oral Surgery;  Laterality: Bilateral;   TRANSTHORACIC ECHOCARDIOGRAM  09/2016  Normal LV size and function.  EF 55 to 60%.  No RWMA.  Normal valves.  No comment on aorta.    ROS: Review of Systems Negative except as stated above  PHYSICAL EXAM: BP 124/81 (BP Location: Left Arm, Patient Position: Sitting, Cuff Size: Normal)   Pulse 77   Wt 186 lb 12.8 oz (84.7 kg)   SpO2 93%   BMI 32.06 kg/m   Physical Exam  {male adult master:310786} {male adult master:310785}     Latest Ref Rng & Units 03/29/2023    4:20 PM 09/15/2022   11:05 AM 09/16/2021    3:51 PM  CMP  Glucose 70 - 99 mg/dL 80  528  94   BUN 8 - 27 mg/dL 14  13  15    Creatinine 0.76 - 1.27 mg/dL 4.13  2.44  0.10   Sodium 134 - 144 mmol/L 143  139  137   Potassium 3.5 - 5.2 mmol/L 4.0  4.4  3.9   Chloride 96 - 106 mmol/L 107  99  102   CO2 20 - 29 mmol/L 24  24  28    Calcium 8.6 - 10.2 mg/dL 9.3  9.5  9.6   Total Protein 6.0 - 8.5 g/dL  6.9  7.1   Total Bilirubin 0.0 - 1.2 mg/dL  0.4  0.5   Alkaline Phos 44 - 121 IU/L  85  77   AST 0 - 40 IU/L  22  19   ALT 0 - 44 IU/L  32  29    Lipid Panel     Component Value Date/Time   CHOL 150 09/15/2022 1105   TRIG 95 09/15/2022 1105   HDL 52 09/15/2022 1105   CHOLHDL 2.9 09/15/2022 1105   CHOLHDL 4.1 10/31/2013 1633   VLDL 34 10/31/2013 1633   LDLCALC 80 09/15/2022 1105    CBC    Component Value Date/Time   WBC 17.6 (H) 01/18/2023 1517   WBC 17.6 (H)  09/16/2021 1551   WBC 9.9 11/23/2016 1331   RBC 5.46 01/18/2023 1517   RBC 5.24 09/16/2021 1551   HGB 15.0 01/18/2023 1517   HCT 44.7 01/18/2023 1517   PLT 178 01/18/2023 1517   MCV 82 01/18/2023 1517   MCH 27.5 01/18/2023 1517   MCH 27.5 09/16/2021 1551   MCHC 33.6 01/18/2023 1517   MCHC 33.6 09/16/2021 1551   RDW 13.0 01/18/2023 1517   LYMPHSABS 12.5 (H) 01/18/2023 1517   MONOABS 0.6 09/16/2021 1551   EOSABS 0.2 01/18/2023 1517   BASOSABS 0.1 01/18/2023 1517    ASSESSMENT AND PLAN:  Assessment and Plan              1. Acquired hypothyroidism ***  2. Gastroesophageal reflux disease without esophagitis ***    Patient was given the opportunity to ask questions.  Patient verbalized understanding of the plan and was able to repeat key elements of the plan.   This documentation was completed using Paediatric nurse.  Any transcriptional errors are unintentional.  No orders of the defined types were placed in this encounter.    Requested Prescriptions   Pending Prescriptions Disp Refills   levothyroxine (SYNTHROID) 150 MCG tablet 90 tablet 0    Sig: Take 1 tablet (150 mcg total) by mouth daily.   pantoprazole (PROTONIX) 40 MG tablet 90 tablet 0    Sig: Take 1 tablet (40 mg total) by mouth daily.    No follow-ups on file.  Jonah Blue, MD, FACP

## 2023-11-10 ENCOUNTER — Telehealth: Payer: Self-pay

## 2023-11-10 ENCOUNTER — Encounter: Payer: Self-pay | Admitting: Internal Medicine

## 2023-11-10 NOTE — Telephone Encounter (Signed)
-----   Message from Artis Delay sent at 11/10/2023  9:20 AM EST ----- Patient declined followup last year due to high copay. PCP sent another referral. Can you ask patient is he is willing to come in, if not let me know and I will let PCP know If he is willing to come in, I can see him on 3/6, please schedule 20 mins appt

## 2023-11-10 NOTE — Telephone Encounter (Signed)
Attempted to call pt per MD. LVM for call back.

## 2023-11-11 NOTE — Telephone Encounter (Signed)
Darren [name], please call him to follow

## 2023-11-11 NOTE — Telephone Encounter (Signed)
Called and left a message asking him to call the office back. Offering appt on 3/6.

## 2023-11-12 ENCOUNTER — Other Ambulatory Visit (HOSPITAL_COMMUNITY): Payer: Self-pay

## 2023-11-12 ENCOUNTER — Other Ambulatory Visit: Payer: Self-pay

## 2023-11-12 NOTE — Telephone Encounter (Signed)
Attempted to call. Voicemail full and unable to leave a message. ?

## 2023-11-12 NOTE — Telephone Encounter (Signed)
He called back. Scheduled appt per his request on 3/11 at 1:20 pm. He is aware of appt.

## 2023-11-19 ENCOUNTER — Other Ambulatory Visit: Payer: Self-pay

## 2023-11-22 ENCOUNTER — Telehealth: Payer: Self-pay

## 2023-11-22 DIAGNOSIS — H9193 Unspecified hearing loss, bilateral: Secondary | ICD-10-CM

## 2023-11-22 NOTE — Addendum Note (Signed)
 Addended by: Jonah Blue B on: 11/22/2023 05:46 PM   Modules accepted: Orders

## 2023-11-22 NOTE — Telephone Encounter (Signed)
 Copied from CRM [id number]. Topic: Referral - Question >> Nov 22, 2023  3:23 PM Suzette B wrote: Reason for CRM: Patient is wanting the provider to get him a referral for an ENT specialist based on what his Otolaryngologist I was going over the screening information with the patient and when it stated if he had not recently discussed this with the patient he stated no. I advised him that he would need to set up an appointment with his PCP to inform her of what was going on he proceeded to get upset and stated he wasn't paying any more money he needed to see the ENT and he wanted the appt

## 2023-11-22 NOTE — Telephone Encounter (Signed)
 Patient has decreased hearing/hard of hearing.  I will refer him to ENT.

## 2023-11-23 ENCOUNTER — Ambulatory Visit (INDEPENDENT_AMBULATORY_CARE_PROVIDER_SITE_OTHER): Payer: BC Managed Care – PPO | Admitting: Otolaryngology

## 2023-11-23 ENCOUNTER — Encounter (INDEPENDENT_AMBULATORY_CARE_PROVIDER_SITE_OTHER): Payer: Self-pay

## 2023-11-23 ENCOUNTER — Other Ambulatory Visit: Payer: Self-pay

## 2023-11-23 VITALS — BP 128/73 | HR 75 | Ht 64.0 in | Wt 178.0 lb

## 2023-11-23 DIAGNOSIS — H903 Sensorineural hearing loss, bilateral: Secondary | ICD-10-CM | POA: Diagnosis not present

## 2023-11-23 DIAGNOSIS — H6123 Impacted cerumen, bilateral: Secondary | ICD-10-CM | POA: Diagnosis not present

## 2023-11-23 DIAGNOSIS — H60392 Other infective otitis externa, left ear: Secondary | ICD-10-CM | POA: Diagnosis not present

## 2023-11-23 MED ORDER — OFLOXACIN 0.3 % OT SOLN
4.0000 [drp] | Freq: Two times a day (BID) | OTIC | 1 refills | Status: AC
  Filled 2023-11-23: qty 5, 13d supply, fill #0

## 2023-11-23 MED ORDER — AMOXICILLIN-POT CLAVULANATE 875-125 MG PO TABS
1.0000 | ORAL_TABLET | Freq: Two times a day (BID) | ORAL | 0 refills | Status: AC
Start: 2023-11-23 — End: 2023-12-03
  Filled 2023-11-23: qty 20, 10d supply, fill #0

## 2023-11-23 NOTE — Telephone Encounter (Signed)
 Called but no answer. Unable to LVM due to VM not being set up.

## 2023-11-23 NOTE — Patient Instructions (Signed)
 Keep ear dry Take Augmentin 875 mg by mouth twice daily for 10 days; take with food, take probiotic or yogurt with it Use ofloxacin ear drops - use 4 drops twice per day for 10 days to left ear

## 2023-11-23 NOTE — Progress Notes (Signed)
 Dear Dr. Laural Benes, Here is my assessment for our mutual patient, Darren [name]. Thank you for allowing me the opportunity to care for your patient. Please do not hesitate to contact me should you have any other questions. Sincerely, Dr. Jovita Kussmaul  Otolaryngology Clinic Note Referring provider: Dr. Laural Benes HPI:  Darren [name] is a 63 y.o. male kindly referred by Dr. Laural Benes for evaluation of otitis externa.  Initial visit (10/2023): Patient reports: [facility] this [facility], and they noted possible ear infection and referred him here. He reports that he has some fullness in the ear and some discomfort for last 4-5 days. Right ear without issues. Ears do itch regularly and he did use a qtip. Denies water exposure. No fevers. He has not tried anything. He does have chronic long-standing hearing loss for which he wears a hearing aid. He does have ear infections on and off. Hearing has declined slowly. Patient denies: vertigo, drainage, tinnitus Patient additionally denies: eustachian tube symptoms such as popping, crackling, sensitivity to pressure changes Patient also denies barotrauma, vestibular suppressant use, ototoxic medication use Prior ear surgery: he denies it, but chart review reports ear tubes He plays the guitar, and noise exposure from that. No FHX of hearing loss. Does have some monetary limitations  H&N Surgery: denies Personal or FHx of bleeding dz or anesthesia difficulty: no   AP/AC: no  Tobacco: quit 2005 (smoked). Lives in [place], [place]  PMHx: GERD, Hypothyroid, HTN, HL, CAD, Emphysema, CLL, Ascending AA, LBP  Independent Review of Additional Tests or Records:  Dr. Jearld Fenton ([facility]) 03/30/2016: h/o long-standing HL; has hearing aids, needs new ones; no OM, but issues in past; no pain/drainage; no vertigo; cleared for HA Audio 03/30/2016 interpreted: A/A tymps  Dr. Laural Benes IM 11/09/2023: noted decreased hearing; ref to ENT BMP and CBC  2024 reviewed: BUN/Cr 14/0.65; WBC 17.6, Hgb 15, Plt 178 PMH/Meds/All/SocHx/FamHx/ROS:   Past Medical History:  Diagnosis Date   Arthritis    per Medical clearance form.   Barrett esophagus 2001   path report in EPIC 2001 c/w Barrett's. endoscopy report unavailable.   COPD (chronic obstructive pulmonary disease) (HCC)    per medical clearance form.   Depression    per Medical Clearance form(09/18/11)   Dilatation of thoracic aorta (HCC) 06/2016   4.2 cm   GERD (gastroesophageal reflux disease)    per Medical clearance form.   Hypertension    Per Medical Clearance form.   Hypothyroidism    per Medical Clearance form.     Past Surgical History:  Procedure Laterality Date   BIOPSY  09/18/2016   Procedure: BIOPSY;  Surgeon: Corbin Ade, MD;  [facility];  Service: Endoscopy;;  biopsy of distal esophagus   ESOPHAGOGASTRODUODENOSCOPY  2001   barrett's per path   ESOPHAGOGASTRODUODENOSCOPY (EGD) WITH PROPOFOL N/A 09/18/2016   Procedure: ESOPHAGOGASTRODUODENOSCOPY (EGD) WITH PROPOFOL;  Surgeon: Corbin Ade, MD;  [facility];  Service: Endoscopy;  Laterality: N/A;  1200   GRADUATED EXERCISE TREADMILL STRESS TEST  09/2016   Exercised 6:33 min.  Reached 117 bpm (7% MPHR 165 bpm).  7.8 METS.  No ischemic EKG changes.  Stopped due to leg fatigue from back pain.  Noted dyspnea.  No chest pain. => Negative symptom limited ETT.   HEMORRHOID SURGERY     1982   MULTIPLE EXTRACTIONS WITH ALVEOLOPLASTY  11/30/2011   Procedure: MULTIPLE EXTRACION WITH ALVEOLOPLASTY;  Surgeon: Georgia Lopes, DDS;  [facility];  Service: Oral Surgery;  Laterality: Bilateral;   TRANSTHORACIC ECHOCARDIOGRAM  09/2016   Normal LV size and function.  EF 55 to 60%.  No RWMA.  Normal valves.  No comment on aorta.    Family History  Problem Relation Age of Onset   Cancer Mother        lung cancer   Lung cancer Mother    Cancer Father        lung   Stroke Father    Heart attack  Father 62   Thyroid disease Sister    Cancer Maternal Aunt        stomach   Colon cancer Neg Hx    Esophageal cancer Neg Hx      Social Connections: Not on file      Current Outpatient Medications:    amLODipine (NORVASC) 10 MG tablet, Take 1 tablet (10 mg total) by mouth daily., Disp: 90 tablet, Rfl: 1   amoxicillin-clavulanate (AUGMENTIN) 875-125 MG tablet, Take 1 tablet by mouth 2 (two) times daily for 10 days., Disp: 20 tablet, Rfl: 0   atorvastatin (LIPITOR) 10 MG tablet, Take 1 tablet (10 mg total) by mouth daily., Disp: 90 tablet, Rfl: 2   levothyroxine (SYNTHROID) 150 MCG tablet, Take 1 tablet (150 mcg total) by mouth daily., Disp: 90 tablet, Rfl: 1   lidocaine (LIDODERM) 5 %, Place 1 patch onto the skin daily. Leave patch on for 12 hrs daily., Disp: 30 patch, Rfl: 3   losartan (COZAAR) 50 MG tablet, Take 1 tablet (50 mg total) by mouth daily., Disp: 90 tablet, Rfl: 1   ofloxacin (FLOXIN) 0.3 % OTIC solution, Place 4 drops into the left ear 2 (two) times daily for 10 days., Disp: 10 mL, Rfl: 1   pantoprazole (PROTONIX) 40 MG tablet, Take 1 tablet (40 mg total) by mouth daily., Disp: 90 tablet, Rfl: 1   albuterol (VENTOLIN HFA) 108 (90 Base) MCG/ACT inhaler, Inhale 2 puffs into the lungs every 6 (six) hours as needed for wheezing or shortness of breath. (Patient not taking: Reported on 11/23/2023), Disp: 8.5 g, Rfl: 2   cetirizine (ZYRTEC) 10 MG tablet, Take 1 tablet (10 mg total) by mouth daily. (Patient not taking: Reported on 11/23/2023), Disp: 90 tablet, Rfl: 0   meloxicam (MOBIC) 7.5 MG tablet, Take 1 tablet (7.5 mg total) by mouth daily. (Patient not taking: Reported on 11/23/2023), Disp: 30 tablet, Rfl: 1   tamsulosin (FLOMAX) 0.4 MG CAPS capsule, Take 1 capsule (0.4 mg total) by mouth daily. (Patient not taking: Reported on 11/09/2023), Disp: 30 capsule, Rfl: 3   Physical Exam:   BP 128/73 (BP Location: Left Arm, Patient Position: Sitting, Cuff Size: Normal)   Pulse 75   Ht 5'  4" (1.626 m)   Wt 178 lb (80.7 kg)   SpO2 95%   BMI 30.55 kg/m   Salient findings:  CN II-XII intact Given history and complaints, ear microscopy was indicated and performed for evaluation with findings as below in physical exam section and in procedure. Bilateral EAC with ceruminous debris impaction (foreign body?) - it appears to be blue in color on both sides; this was suctioned, no obvious purulence and cleared; after clearance, TM appears intact (on left and right, left looks mildly thickened) with some myringosclerosis anteriorly, but posteriorly ME looks aerated Weber 512: unreliable Rinne 512: AC > BC Anterior rhinoscopy: Septum intact; no purulence No lesions of oral cavity/oropharynx No obviously palpable neck masses/lymphadenopathy/thyromegaly No respiratory distress or  stridor  Seprately Identifiable Procedures:  Procedure: Bilateral ear microscopy and cerumen removal using microscope (CPT [phone]) - Mod 25 Pre-procedure diagnosis: Bilateral Cerumen impaction bilateral external ears; left ear otitis externa Post-procedure diagnosis: same Indication: Bilateral cerumen impaction; given patient's otologic complaints and history as well as for improved and comprehensive examination of external ear and tympanic membrane, bilateral otologic examination using microscope was performed and impacted cerumen removed  Procedure: Patient was placed semi-recumbent. Both ear canals were examined using the microscope with findings above. Impacted Cerumen and debris removed on left and on right using suction with improvement in EAC examination and patency. Further findings as above. Ciprodex drops placed in left ear Patient tolerated the procedure well.   Impression & Plans:  Zacherie Honeyman is a 63 y.o. male with:  1. Other infective acute otitis externa of left ear   2. Bilateral impacted cerumen   3. Sensorineural hearing loss (SNHL), bilateral    Noted left ear sx for about 4-5 days after  qtip use due to itching; no water exposure but does have pain. Known b/l hearing loss; went to [facility] for hearing aid replacement but referred given concern for infection; B/l cerumen and debris impaction cleared today, and there is some wet debris in the canal and some TM thickening concerning for OE. As such, we will treat. He has some financial limitations so will Rx ofloxacin instead of ciprodex.   Keep ear dry Avoid hearing aid left ear Ofloxacin BID x10d left ear Augmentin BID PO x10d F/u in 10 days with Burna Forts, PA-C to ensure ear looks improved prior to HA use  See below regarding exact medications prescribed this encounter including dosages and route: Meds ordered this encounter  Medications   ofloxacin (FLOXIN) 0.3 % OTIC solution    Sig: Place 4 drops into the left ear 2 (two) times daily for 10 days.    Dispense:  10 mL    Refill:  1   amoxicillin-clavulanate (AUGMENTIN) 875-125 MG tablet    Sig: Take 1 tablet by mouth 2 (two) times daily for 10 days.    Dispense:  20 tablet    Refill:  0      Thank you for allowing me the opportunity to care for your patient. Please do not hesitate to contact me should you have any other questions.  Sincerely, Jovita Kussmaul, MD Otolaryngologist (ENT), [facility] Phone: [phone] Fax: [phone]  11/23/2023, 2:27 PM   MDM:  Level 4 Complexity/Problems addressed: mod - chronic problem and a new problem Data complexity: mod - independent review of notes, labs, tests - Morbidity: mod  - Prescription Drug prescribed or managed: yes

## 2023-11-24 ENCOUNTER — Other Ambulatory Visit: Payer: Self-pay

## 2023-11-24 NOTE — Telephone Encounter (Signed)
 Called but no answer. Unable to LVM due to VM not being set up.

## 2023-11-25 ENCOUNTER — Other Ambulatory Visit: Payer: Self-pay

## 2023-11-26 ENCOUNTER — Other Ambulatory Visit: Payer: Self-pay

## 2023-12-02 DIAGNOSIS — H903 Sensorineural hearing loss, bilateral: Secondary | ICD-10-CM | POA: Diagnosis not present

## 2023-12-06 ENCOUNTER — Ambulatory Visit (INDEPENDENT_AMBULATORY_CARE_PROVIDER_SITE_OTHER): Payer: BC Managed Care – PPO

## 2023-12-07 ENCOUNTER — Inpatient Hospital Stay: Payer: BC Managed Care – PPO | Attending: Hematology and Oncology | Admitting: Hematology and Oncology

## 2023-12-07 ENCOUNTER — Encounter: Payer: Self-pay | Admitting: Hematology and Oncology

## 2023-12-07 ENCOUNTER — Other Ambulatory Visit: Payer: Self-pay

## 2023-12-07 VITALS — BP 140/68 | HR 63 | Temp 98.0°F | Resp 18 | Ht 64.0 in | Wt 186.2 lb

## 2023-12-07 DIAGNOSIS — Z599 Problem related to housing and economic circumstances, unspecified: Secondary | ICD-10-CM | POA: Diagnosis not present

## 2023-12-07 DIAGNOSIS — C9110 Chronic lymphocytic leukemia of B-cell type not having achieved remission: Secondary | ICD-10-CM | POA: Insufficient documentation

## 2023-12-07 NOTE — Assessment & Plan Note (Addendum)
 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

## 2023-12-07 NOTE — Assessment & Plan Note (Addendum)
 He has significant financial burden and cannot afford copayment for his future follow-up. We discussed the rationale of why he needs annual follow-up but I have already spent a lot of time from our previous visit and today's visit on discussion about future follow-up Overall, at this point in time, I recommend the patient to return to his primary care doctor for annual CBC monitoring As long as he is white blood cell count remained stable, he can be monitored closely until he can afford regular copayment in the future

## 2023-12-07 NOTE — Progress Notes (Signed)
 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 We discussed the rationale of why he needs annual follow-up but I have already spent a lot of time from our previous visit and today's visit on discussion about future follow-up Overall, at this point in time, I recommend the patient to return to his primary care doctor for annual CBC monitoring As long as he is white blood cell count remained stable, he can be monitored closely until he can afford regular copayment in the future  No orders of the defined types were placed in this encounter.    Artis Delay, MD  INTERVAL HISTORY: he returns for surveillance follow-up He is referred by his primary care doctor back to see me again today He was last seen in May 2024 At the time, his examination is benign and the patient declined follow-up due to high copayment for his visit Today, he spent another 10-15 minutes explaining to me his financial situation and difficulties paying for his co-pay He denies abnormal lymphadenopathy He has recent ear infection.  He has poor hearing  PHYSICAL EXAMINATION: ECOG PERFORMANCE STATUS: 1 - Symptomatic but completely ambulatory  Vitals:   12/07/23 1309  BP: (!) 140/68  Pulse: 63  Resp: 18  Temp: 98 F (36.7 C)  SpO2: 95%   Filed Weights   12/07/23 1309   Weight: 186 lb 3.2 oz (84.5 kg)   GENERAL:alert, no distress and comfortable SKIN: skin color, texture, turgor are normal, no rashes or significant lesions EYES: normal, conjunctiva are pink and non-injected, sclera clear OROPHARYNX:no exudate, no erythema and lips, buccal mucosa, and tongue normal.  He has poor dentition NECK: supple, thyroid normal size, non-tender, without nodularity LYMPH:  no palpable lymphadenopathy in the cervical, axillary or inguinal LUNGS: clear to auscultation and percussion with normal breathing effort HEART: regular rate & rhythm and no murmurs and no lower extremity edema ABDOMEN:abdomen soft, non-tender and normal bowel sounds Musculoskeletal:no cyanosis of digits and no clubbing  PSYCH: alert & oriented x 3 with fluent speech NEURO: no focal motor/sensory deficits.  He has poor hearing

## 2023-12-20 DIAGNOSIS — H903 Sensorineural hearing loss, bilateral: Secondary | ICD-10-CM | POA: Diagnosis not present

## 2023-12-24 ENCOUNTER — Other Ambulatory Visit: Payer: Self-pay

## 2024-01-04 ENCOUNTER — Other Ambulatory Visit: Payer: Self-pay

## 2024-01-07 ENCOUNTER — Other Ambulatory Visit: Payer: Self-pay

## 2024-01-28 ENCOUNTER — Other Ambulatory Visit: Payer: Self-pay

## 2024-02-01 ENCOUNTER — Other Ambulatory Visit: Payer: Self-pay

## 2024-02-29 ENCOUNTER — Other Ambulatory Visit: Payer: Self-pay | Admitting: Internal Medicine

## 2024-02-29 ENCOUNTER — Other Ambulatory Visit: Payer: Self-pay

## 2024-02-29 ENCOUNTER — Other Ambulatory Visit: Payer: Self-pay | Admitting: Physician Assistant

## 2024-02-29 DIAGNOSIS — I10 Essential (primary) hypertension: Secondary | ICD-10-CM

## 2024-02-29 MED ORDER — LIDOCAINE 5 % EX PTCH
1.0000 | MEDICATED_PATCH | CUTANEOUS | 0 refills | Status: DC
  Filled 2024-02-29: qty 30, 30d supply, fill #0

## 2024-02-29 MED ORDER — AMLODIPINE BESYLATE 10 MG PO TABS
10.0000 mg | ORAL_TABLET | Freq: Every day | ORAL | 0 refills | Status: DC
  Filled 2024-02-29: qty 30, 30d supply, fill #0

## 2024-02-29 MED ORDER — LOSARTAN POTASSIUM 50 MG PO TABS
50.0000 mg | ORAL_TABLET | Freq: Every day | ORAL | 0 refills | Status: DC
  Filled 2024-02-29: qty 30, 30d supply, fill #0

## 2024-03-03 ENCOUNTER — Other Ambulatory Visit: Payer: Self-pay

## 2024-03-13 ENCOUNTER — Other Ambulatory Visit: Payer: Self-pay

## 2024-03-27 ENCOUNTER — Other Ambulatory Visit: Payer: Self-pay | Admitting: Internal Medicine

## 2024-03-27 ENCOUNTER — Other Ambulatory Visit: Payer: Self-pay | Admitting: Physician Assistant

## 2024-03-27 ENCOUNTER — Other Ambulatory Visit: Payer: Self-pay

## 2024-03-27 DIAGNOSIS — I10 Essential (primary) hypertension: Secondary | ICD-10-CM

## 2024-03-27 DIAGNOSIS — E782 Mixed hyperlipidemia: Secondary | ICD-10-CM

## 2024-03-28 ENCOUNTER — Other Ambulatory Visit: Payer: Self-pay

## 2024-03-28 MED ORDER — ATORVASTATIN CALCIUM 10 MG PO TABS
10.0000 mg | ORAL_TABLET | Freq: Every day | ORAL | 2 refills | Status: AC
  Filled 2024-03-28 – 2024-03-30 (×2): qty 90, 90d supply, fill #0
  Filled 2024-06-30: qty 90, 90d supply, fill #1
  Filled 2024-09-26 (×2): qty 90, 90d supply, fill #2
  Filled 2024-09-26: qty 90, 90d supply, fill #0

## 2024-03-29 ENCOUNTER — Other Ambulatory Visit: Payer: Self-pay

## 2024-03-29 MED ORDER — LOSARTAN POTASSIUM 50 MG PO TABS
50.0000 mg | ORAL_TABLET | Freq: Every day | ORAL | 0 refills | Status: DC
  Filled 2024-03-29 – 2024-03-30 (×2): qty 90, 90d supply, fill #0

## 2024-03-29 MED ORDER — AMLODIPINE BESYLATE 10 MG PO TABS
10.0000 mg | ORAL_TABLET | Freq: Every day | ORAL | 0 refills | Status: DC
  Filled 2024-03-29 – 2024-03-30 (×2): qty 90, 90d supply, fill #0

## 2024-03-30 ENCOUNTER — Other Ambulatory Visit (HOSPITAL_COMMUNITY): Payer: Self-pay

## 2024-03-30 ENCOUNTER — Other Ambulatory Visit: Payer: Self-pay

## 2024-04-03 ENCOUNTER — Other Ambulatory Visit: Payer: Self-pay

## 2024-04-19 ENCOUNTER — Other Ambulatory Visit: Payer: Self-pay

## 2024-04-19 ENCOUNTER — Encounter: Payer: Self-pay | Admitting: Pharmacist

## 2024-04-19 ENCOUNTER — Other Ambulatory Visit: Payer: Self-pay | Admitting: Internal Medicine

## 2024-04-24 ENCOUNTER — Other Ambulatory Visit: Payer: Self-pay

## 2024-04-24 ENCOUNTER — Telehealth: Payer: Self-pay | Admitting: Internal Medicine

## 2024-04-24 ENCOUNTER — Other Ambulatory Visit: Payer: Self-pay | Admitting: Internal Medicine

## 2024-04-24 NOTE — Telephone Encounter (Signed)
 Copied from CRM [id number]. Topic: Clinical - Medication Question >> Apr 24, 2024 12:16 PM Precious C wrote:  Reason for CRM: Patient called in to request a medication refill after being advised by the pharmacy that he would need to set an appointment with his provider before receiving the refill. The patient expressed frustration, stating he did not want to schedule an appointment and incur unnecessary costs, as he believes his provider is already aware of his condition. I contacted [facility] to inquire about scheduling options and was advised to send a CRM to admin. Patient requested a follow-up and can be reached at [phone].

## 2024-04-24 NOTE — Telephone Encounter (Signed)
 Called & spoke to the patient. Verified name & DOB. Informed that a visit is necessary for any medication refills per Dr.Johnson. Patient expressed frustration but is understanding. Scheduled visit for 04/25/2024. Patient confirmed appointment and expressed verbal understanding of all discussed.

## 2024-04-24 NOTE — Telephone Encounter (Signed)
 Can his medication be refilled?

## 2024-04-25 ENCOUNTER — Other Ambulatory Visit: Payer: Self-pay

## 2024-04-25 ENCOUNTER — Encounter: Payer: Self-pay | Admitting: Pharmacist

## 2024-04-25 ENCOUNTER — Encounter: Payer: Self-pay | Admitting: Internal Medicine

## 2024-04-25 ENCOUNTER — Ambulatory Visit: Attending: Internal Medicine | Admitting: Internal Medicine

## 2024-04-25 VITALS — BP 117/73 | HR 65 | Temp 97.9°F | Ht 64.0 in | Wt 188.0 lb

## 2024-04-25 DIAGNOSIS — E782 Mixed hyperlipidemia: Secondary | ICD-10-CM

## 2024-04-25 DIAGNOSIS — I10 Essential (primary) hypertension: Secondary | ICD-10-CM | POA: Diagnosis not present

## 2024-04-25 DIAGNOSIS — E039 Hypothyroidism, unspecified: Secondary | ICD-10-CM | POA: Diagnosis not present

## 2024-04-25 DIAGNOSIS — G8929 Other chronic pain: Secondary | ICD-10-CM

## 2024-04-25 DIAGNOSIS — Z1211 Encounter for screening for malignant neoplasm of colon: Secondary | ICD-10-CM

## 2024-04-25 DIAGNOSIS — K439 Ventral hernia without obstruction or gangrene: Secondary | ICD-10-CM

## 2024-04-25 DIAGNOSIS — J439 Emphysema, unspecified: Secondary | ICD-10-CM

## 2024-04-25 DIAGNOSIS — M545 Low back pain, unspecified: Secondary | ICD-10-CM

## 2024-04-25 DIAGNOSIS — K219 Gastro-esophageal reflux disease without esophagitis: Secondary | ICD-10-CM

## 2024-04-25 DIAGNOSIS — D170 Benign lipomatous neoplasm of skin and subcutaneous tissue of head, face and neck: Secondary | ICD-10-CM

## 2024-04-25 MED ORDER — PANTOPRAZOLE SODIUM 40 MG PO TBEC
40.0000 mg | DELAYED_RELEASE_TABLET | Freq: Every day | ORAL | 1 refills | Status: AC
  Filled 2024-04-25 – 2024-06-09 (×2): qty 90, 90d supply, fill #0
  Filled 2024-09-11: qty 90, 90d supply, fill #1

## 2024-04-25 MED ORDER — LIDOCAINE 5 % EX PTCH
1.0000 | MEDICATED_PATCH | CUTANEOUS | 5 refills | Status: AC
  Filled 2024-04-25: qty 30, 30d supply, fill #0
  Filled 2024-06-30: qty 30, 30d supply, fill #1
  Filled 2024-08-03: qty 30, 30d supply, fill #2
  Filled 2024-09-26: qty 30, 30d supply, fill #3

## 2024-04-25 MED ORDER — AMLODIPINE BESYLATE 10 MG PO TABS
10.0000 mg | ORAL_TABLET | Freq: Every day | ORAL | 1 refills | Status: AC
  Filled 2024-04-25 – 2024-06-30 (×2): qty 90, 90d supply, fill #0
  Filled 2024-09-26 (×3): qty 90, 90d supply, fill #1

## 2024-04-25 MED ORDER — LOSARTAN POTASSIUM 50 MG PO TABS
50.0000 mg | ORAL_TABLET | Freq: Every day | ORAL | 1 refills | Status: AC
  Filled 2024-04-25 – 2024-06-30 (×2): qty 90, 90d supply, fill #0
  Filled 2024-09-26 (×3): qty 90, 90d supply, fill #1

## 2024-04-25 NOTE — Patient Instructions (Signed)
 VISIT SUMMARY:  Today, you came in for a follow-up visit to discuss your chronic conditions. We reviewed your lipoma, ventral hernia, blood pressure, cholesterol, thyroid  function, back pain, and COPD. We also discussed your financial concerns regarding the removal of the lipoma.  YOUR PLAN:  -LIPOMA OF POSTERIOR NECK: A lipoma is a benign fatty lump that can grow under your skin. Your lipoma has increased slightly in size and is causing occasional discomfort. We measured the size today and discussed your financial concerns about the cost of removal. If financially feasible, we may consider a CT scan to get a better look at it.  -VENTRAL HERNIA WITHOUT OBSTRUCTION OR GANGRENE: A ventral hernia is a bulge of tissues through an opening in the muscles of your abdomen. Your hernia is not causing pain or symptoms but is cosmetically bothersome. You should avoid heavy lifting, pushing, or pulling to prevent complications. Be aware of signs of bowel obstruction and seek emergency care if they occur.  -ESSENTIAL HYPERTENSION: Hypertension is high blood pressure. Your blood pressure is well-controlled with your current medications. Continue taking amlodipine  10 mg daily and losartan  50 mg daily as prescribed.  -HYPERLIPIDEMIA: Hyperlipidemia means having high levels of fats (lipids) in your blood. You are managing this condition well with atorvastatin . Continue taking atorvastatin  as prescribed.  -HYPOTHYROIDISM: Hypothyroidism is when your thyroid  gland doesn't produce enough thyroid  hormone. You are taking levothyroxine  for this condition, but your thyroid  levels have not been checked in over a year. We will order a thyroid  function test along with tests for cholesterol, kidney, and liver function.  -CHRONIC LOW BACK PAIN: Chronic low back pain is long-term pain in your lower back. You are using lidocaine  patches for relief and should continue to avoid heavy lifting to prevent worsening of the pain. We will  refill your prescription for lidocaine  patches.  -CHRONIC OBSTRUCTIVE PULMONARY DISEASE (COPD): COPD is a chronic lung condition that makes it hard to breathe. Your symptoms worsen in hot weather, and you use an inhaler as needed. Keep your inhaler available, especially in hot weather.  INSTRUCTIONS:  Please follow up with the case worker to discuss your financial concerns regarding the lipoma removal. We will also need you to complete the thyroid  function test and other lab tests for cholesterol, kidney, and liver function. Use the new stool kit provided for health maintenance.

## 2024-04-25 NOTE — Progress Notes (Signed)
 Patient ID: Darren [name], male    DOB: 1961-07-29  MRN: [id number]  CC: Hypertension (HTN f/u. Layvonne rx for Voltaren  gel /Mass on back of neck growing in size /Hernia on abdomen )   Subjective: Darren [name] is a 63 y.o. male who presents for chronic ds management. His concerns today include:  Pt with hx of GERD, hypothyroid, HTN, HL, CAD as suggested by coronary CT 06/2023, dilation of thoracic aorta, centrilobular emphysema, CLL followed by Dr. Clayborne, ascending aortic aneurysm (followed by vascular), chronic LBP and Barrett's esophagus.   Hx of COVID infection.   Discussed the use of AI scribe software for clinical note transcription with the patient, who gave verbal consent to proceed.  History of Present Illness Darren [name] is a 63 year old male who presents for follow-up of his chronic conditions.  He has a lipoma on the back of his neck, which he feels may have increased in size since his last visit in February. It is more noticeable, especially when driving or sitting, though it causes only occasional discomfort.   He has a ventral hernia in his abdomen, which he finds cosmetically bothersome but does not cause pain or interfere with eating. He is cautious about heavy lifting due to awareness of potential complications.  HTN: He continues to take amlodipine  10 mg and Cozaar  50 mg for blood pressure control. No CP; occasional SOB.   He also takes levothyroxine  for his thyroid  condition, although it has been over a year since his thyroid  levels were checked.   He is on atorvastatin  for cholesterol management. Tolerating the med okay.  He experiences shortness of breath primarily when exposed to hot weather, which he attributes to COPD. He uses his albuterol  inhaler as needed, especially in hot conditions.  He uses lidocaine  patches for lower back pain, which provide some relief. He has a history of back pain exacerbated by a past accident and has received chiropractic  care and injections in the past, which provided temporary relief. No radiation of pain down the legs.  He requests refill on lidocaine  patches.  HM: He misplaced a stool kit for health maintenance and is willing to use a new one provided during the visit.    Patient Active Problem List   Diagnosis Date Noted   Financial difficulties 12/07/2023   Preop cardiovascular exam 04/01/2023   Precordial pain 03/29/2023   Prediabetes 02/11/2022   Infective otitis externa of both ears 10/08/2021   Seasonal allergies 10/08/2021   Chronic lymphocytic leukemia (HCC) 09/05/2021   Ascending aortic aneurysm (HCC) 01/16/2021   Hyperlipidemia 12/09/2020   Lipoma of neck 11/24/2019   Class 1 obesity due to excess calories without serious comorbidity with body mass index (BMI) of 31.0 to 31.9 in adult 11/04/2017   Disorder of left sacroiliac joint 12/21/2016   Spondylosis without myelopathy or radiculopathy, lumbar region 12/21/2016   Lumbar degenerative disc disease 12/21/2016   COPD (chronic obstructive pulmonary disease) with emphysema (HCC) 07/31/2016   Essential hypertension 07/31/2016   Dilation of thoracic aorta (HCC) 07/31/2016   Esophageal dysphagia 07/09/2016   Chronic midline low back pain 07/09/2016   Sensorineural hearing loss (SNHL), bilateral 03/30/2016   Vitamin D  deficiency 04/06/2014   Hypothyroidism 10/31/2013   GERD (gastroesophageal reflux disease) 11/25/2011   Barrett esophagus 09/29/1999     Current Outpatient Medications on File Prior to Visit  Medication Sig Dispense Refill   albuterol  (VENTOLIN  HFA) 108 (90 Base) MCG/ACT inhaler Inhale 2 puffs into  the lungs every 6 (six) hours as needed for wheezing or shortness of breath. 8.5 g 2   atorvastatin  (LIPITOR) 10 MG tablet Take 1 tablet (10 mg total) by mouth daily. 90 tablet 2   levothyroxine  (SYNTHROID ) 150 MCG tablet Take 1 tablet (150 mcg total) by mouth daily. 90 tablet 1   tamsulosin  (FLOMAX ) 0.4 MG CAPS capsule Take 1  capsule (0.4 mg total) by mouth daily. 30 capsule 3   cetirizine  (ZYRTEC ) 10 MG tablet Take 1 tablet (10 mg total) by mouth daily. (Patient not taking: Reported on 04/25/2024) 90 tablet 0   meloxicam  (MOBIC ) 7.5 MG tablet Take 1 tablet (7.5 mg total) by mouth daily. (Patient not taking: Reported on 04/25/2024) 30 tablet 1   No current facility-administered medications on file prior to visit.    No Known Allergies  Social History   Socioeconomic History   Marital status: Divorced    Spouse name: Not on file   Number of children: 2   Years of education: Not on file   Highest education level: Not on file  Occupational History   Occupation: Administrator, Civil Service  Tobacco Use   Smoking status: Former    Current packs/day: 0.00    Average packs/day: 1 pack/day for 26.0 years (26.0 ttl pk-yrs)    Types: Cigarettes    Start date: 09/15/1978    Quit date: 09/15/2004    Years since quitting: 19.6   Smokeless tobacco: Never   Tobacco comments:    Quit in 2005  Vaping Use   Vaping status: Never Used  Substance and Sexual Activity   Alcohol use: Yes    Comment: ON WEEKENDS   Drug use: No   Sexual activity: Not Currently    Birth control/protection: None  Other Topics Concern   Not on file  Social History Narrative   Lives alone.     Social Drivers of Corporate investment banker Strain: Low Risk  (04/25/2024)   Overall Financial Resource Strain (CARDIA)    Difficulty of Paying Living Expenses: Not very hard  Food Insecurity: No Food Insecurity (04/25/2024)   Hunger Vital Sign    Worried About Running Out of Food in the Last Year: Never true    Ran Out of Food in the Last Year: Never true  Transportation Needs: No Transportation Needs (04/25/2024)   PRAPARE - Administrator, Civil Service (Medical): No    Lack of Transportation (Non-Medical): No  Physical Activity: Insufficiently Active (04/25/2024)   Exercise Vital Sign    Days of Exercise per Week: 2 days    Minutes of  Exercise per Session: 30 min  Stress: No Stress Concern Present (04/25/2024)   [employer] of Occupational Health - Occupational Stress Questionnaire    Feeling of Stress: Not at all  Social Connections: Socially Isolated (04/25/2024)   Social Connection and Isolation Panel    Frequency of Communication with Friends and Family: Twice a week    Frequency of Social Gatherings with Friends and Family: Once a week    Attends Religious Services: Never    Database administrator or Organizations: No    Attends Banker Meetings: Never    Marital Status: Divorced  Catering manager Violence: Not At Risk (04/25/2024)   Humiliation, Afraid, Rape, and Kick questionnaire    Fear of Current or Ex-Partner: No    Emotionally Abused: No    Physically Abused: No    Sexually Abused: No    Family History  Problem Relation Age of Onset   Cancer Mother        lung cancer   Lung cancer Mother    Cancer Father        lung   Stroke Father    Heart attack Father 20   Thyroid  disease Sister    Cancer Maternal Aunt        stomach   Colon cancer Neg Hx    Esophageal cancer Neg Hx     Past Surgical History:  Procedure Laterality Date   BIOPSY  09/18/2016   Procedure: BIOPSY;  Surgeon: Lamar CHRISTELLA Hollingshead, MD;  Location: [facility];  Service: Endoscopy;;  biopsy of distal esophagus   ESOPHAGOGASTRODUODENOSCOPY  2001   barrett's per path   ESOPHAGOGASTRODUODENOSCOPY (EGD) WITH PROPOFOL  N/A 09/18/2016   Procedure: ESOPHAGOGASTRODUODENOSCOPY (EGD) WITH PROPOFOL ;  Surgeon: Lamar CHRISTELLA Hollingshead, MD;  Location: [facility];  Service: Endoscopy;  Laterality: N/A;  1200   GRADUATED EXERCISE TREADMILL STRESS TEST  09/2016   Exercised 6:33 min.  Reached 117 bpm (7% MPHR 165 bpm).  7.8 METS.  No ischemic EKG changes.  Stopped due to leg fatigue from back pain.  Noted dyspnea.  No chest pain. => Negative symptom limited ETT.   HEMORRHOID SURGERY     1982   MULTIPLE EXTRACTIONS WITH ALVEOLOPLASTY   11/30/2011   Procedure: MULTIPLE EXTRACION WITH ALVEOLOPLASTY;  Surgeon: Glendia CHRISTELLA Primrose, DDS;  Location: [facility] OR;  Service: Oral Surgery;  Laterality: Bilateral;   TRANSTHORACIC ECHOCARDIOGRAM  09/2016   Normal LV size and function.  EF 55 to 60%.  No RWMA.  Normal valves.  No comment on aorta.    ROS: Review of Systems Negative except as stated above  PHYSICAL EXAM: BP 117/73 (BP Location: Left Arm, Patient Position: Sitting, Cuff Size: Normal)   Pulse 65   Temp 97.9 F (36.6 C) (Oral)   Ht 5' 4 (1.626 m)   Wt 188 lb (85.3 kg)   SpO2 95%   BMI 32.27 kg/m   Physical Exam   General appearance - alert, well appearing, and in no distress Mental status -patient wears hearing aids.  I have to speak in a loud voice.  He is alert and oriented and answers questions appropriately. Neck - supple, no significant adenopathy Chest - clear to auscultation, no wheezes, rales or rhonchi, symmetric air entry Heart - normal rate, regular rhythm, normal S1, S2, no murmurs, rubs, clicks or gallops Abdomen -soft, nontender.  Again noted is some bulging of the abdominal wall between the epigastric and umbilical region when he goes from laying down to sitting up Extremities -trace lower extremity edema MSK: He has a soft raised tumor at the back of the neck that is measuring 7.5  x 7 cm     Latest Ref Rng & Units 03/29/2023    4:20 PM 09/15/2022   11:05 AM 09/16/2021    3:51 PM  CMP  Glucose 70 - 99 mg/dL 80  893  94   BUN 8 - 27 mg/dL 14  13  15    Creatinine 0.76 - 1.27 mg/dL 9.34  9.26  9.17   Sodium 134 - 144 mmol/L 143  139  137   Potassium 3.5 - 5.2 mmol/L 4.0  4.4  3.9   Chloride 96 - 106 mmol/L 107  99  102   CO2 20 - 29 mmol/L 24  24  28    Calcium  8.6 - 10.2 mg/dL 9.3  9.5  9.6  Total Protein 6.0 - 8.5 g/dL  6.9  7.1   Total Bilirubin 0.0 - 1.2 mg/dL  0.4  0.5   Alkaline Phos 44 - 121 IU/L  85  77   AST 0 - 40 IU/L  22  19   ALT 0 - 44 IU/L  32  29    Lipid Panel     Component  Value Date/Time   CHOL 150 09/15/2022 1105   TRIG 95 09/15/2022 1105   HDL 52 09/15/2022 1105   CHOLHDL 2.9 09/15/2022 1105   CHOLHDL 4.1 10/31/2013 1633   VLDL 34 10/31/2013 1633   LDLCALC 80 09/15/2022 1105    CBC    Component Value Date/Time   WBC 17.6 (H) 01/18/2023 1517   WBC 17.6 (H) 09/16/2021 1551   WBC 9.9 11/23/2016 1331   RBC 5.46 01/18/2023 1517   RBC 5.24 09/16/2021 1551   HGB 15.0 01/18/2023 1517   HCT 44.7 01/18/2023 1517   PLT 178 01/18/2023 1517   MCV 82 01/18/2023 1517   MCH 27.5 01/18/2023 1517   MCH 27.5 09/16/2021 1551   MCHC 33.6 01/18/2023 1517   MCHC 33.6 09/16/2021 1551   RDW 13.0 01/18/2023 1517   LYMPHSABS 12.5 (H) 01/18/2023 1517   MONOABS 0.6 09/16/2021 1551   EOSABS 0.2 01/18/2023 1517   BASOSABS 0.1 01/18/2023 1517    ASSESSMENT AND PLAN: 1. Essential hypertension (Primary) At goal.  Continue amlodipine  and Cozaar . - amLODipine  (NORVASC ) 10 MG tablet; Take 1 tablet (10 mg total) by mouth daily.Must have office visit for refills  Dispense: 90 tablet; Refill: 1 - losartan  (COZAAR ) 50 MG tablet; Take 1 tablet (50 mg total) by mouth daily.Must have office visit for refills  Dispense: 90 tablet; Refill: 1 - CBC; Future - Comprehensive metabolic panel with GFR; Future  2. Acquired hypothyroidism Continue levothyroxine .  Strongly request that he returns to the lab sometime this week to have his blood test done including TSH level. - TSH; Future  3. Mixed hyperlipidemia Continue atorvastatin  10 mg daily - Lipid panel; Future  4. Chronic midline low back pain without sciatica - lidocaine  (LIDODERM ) 5 %; Place 1 patch onto the skin daily. Leave patch on for 12 hrs daily.Must have office visit for refills  Dispense: 30 patch; Refill: 5  5. Pulmonary emphysema, unspecified emphysema type (HCC) Continue albuterol  as needed  6. Lipoma of neck There has been minimal increase in size.  I inquired whether he would like to have this removed.  If so  we would need to formally do a CAT scan or MRI and then refer to a general surgeon.  Patient states he would like to have it removed but his deductible for his insurance is $1700 which he cannot afford at this time.  He is wondering if there is some programs that can assist with paying the deductible.  I told him that we do not have any program to assist for insured patients but I can certainly ask our case worker.  7. Ventral hernia without obstruction or gangrene We agreed to continue to observe for now.  Went over signs and symptoms that would suggest bowel blockage and need for emergency evaluation.  Advised to avoid heavy lifting, pushing and pulling  8. Gastroesophageal reflux disease without esophagitis Refill sent on pantoprazole  - pantoprazole  (PROTONIX ) 40 MG tablet; Take 1 tablet (40 mg total) by mouth daily.  Dispense: 90 tablet; Refill: 1  9. Screening for colon cancer - Fecal occult blood, imunochemical([facility])  Patient was given the opportunity to ask questions.  Patient verbalized understanding of the plan and was able to repeat key elements of the plan.   This documentation was completed using Paediatric nurse.  Any transcriptional errors are unintentional.  Orders Placed This Encounter  Procedures   Fecal occult blood, imunochemical([facility])   CBC   Comprehensive metabolic panel with GFR   Lipid panel   TSH     Requested Prescriptions   Signed Prescriptions Disp Refills   lidocaine  (LIDODERM ) 5 % 30 patch 5    Sig: Place 1 patch onto the skin daily. Leave patch on for 12 hrs daily.Must have office visit for refills   amLODipine  (NORVASC ) 10 MG tablet 90 tablet 1    Sig: Take 1 tablet (10 mg total) by mouth daily.Must have office visit for refills   losartan  (COZAAR ) 50 MG tablet 90 tablet 1    Sig: Take 1 tablet (50 mg total) by mouth daily.Must have office visit for refills   pantoprazole  (PROTONIX ) 40 MG tablet 90 tablet 1     Sig: Take 1 tablet (40 mg total) by mouth daily.    Return in about 4 months (around 08/26/2024) for Give lab appt for later this week or next week.  Barnie Louder, MD, FACP

## 2024-04-28 DIAGNOSIS — Z1211 Encounter for screening for malignant neoplasm of colon: Secondary | ICD-10-CM | POA: Diagnosis not present

## 2024-04-29 ENCOUNTER — Ambulatory Visit: Payer: Self-pay | Admitting: Internal Medicine

## 2024-04-29 LAB — FECAL OCCULT BLOOD, IMMUNOCHEMICAL: Fecal Occult Bld: NEGATIVE

## 2024-05-03 ENCOUNTER — Telehealth: Payer: Self-pay

## 2024-05-03 ENCOUNTER — Ambulatory Visit: Payer: Self-pay

## 2024-05-03 NOTE — Telephone Encounter (Signed)
 Message received that the patient returned my call.  I tried calling him back and had to leave another message requesting a call back.

## 2024-05-03 NOTE — Telephone Encounter (Signed)
 Message received stating:   Pt states that he spoke with Dr Vicci about scheduling an x-ray and possible surgery and asked about resources to help pay for it due to his high deductible. Please f/u with pt.        I returned the call the the patient : [phone]  and had to leave a message requesting a call back

## 2024-05-03 NOTE — Telephone Encounter (Signed)
 FYI Only or Action Required?: Action required by provider: lab or test result follow-up needed.  Patient was last seen in primary care on 04/25/2024 by Vicci Barnie NOVAK, MD.  Called Nurse Triage reporting Advice Only.  Symptoms began today.  Interventions attempted: Nothing.  Symptoms are: unchanged.  Triage Disposition: No disposition on file.  Patient/caregiver understands and will follow disposition?:     Copied from CRM [id number]. Topic: Clinical - Lab/Test Results >> May 03, 2024 11:22 AM Antwanette L wrote: Reason for CRM: Pt had a missed from [facility] about lab results. Clarissa is not in the office. Reason for Disposition . Caller requesting routine or non-urgent lab result  Answer Assessment - Initial Assessment Questions 1. REASON FOR CALL or QUESTION: What is your reason for calling today? or How can I best     Had a missed call about lab results, relayed message that his FIT test came back negative and for him to repeat in 1 year.   2. CALLER: Document the source of call. (e.g., laboratory staff, caregiver or patient).     pt  Protocols used: PCP Call - No Triage-A-AH

## 2024-05-03 NOTE — Telephone Encounter (Signed)
 Pt states that he spoke with Dr Vicci about scheduling an x-ray and possible surgery and asked about resources to help pay for it due to his high deductible. Please f/u with pt.

## 2024-05-03 NOTE — Telephone Encounter (Signed)
**Note De-identified  [place] Obfuscation** Please advise 

## 2024-05-04 NOTE — Telephone Encounter (Signed)
 I tried to reach the patient again:  [phone]  and had to leave a message requesting a call back

## 2024-05-10 NOTE — Telephone Encounter (Signed)
 I tried to reach the patient again:  [phone]  and had to leave a message requesting a call back

## 2024-05-11 ENCOUNTER — Encounter (HOSPITAL_COMMUNITY): Payer: Self-pay | Admitting: General Practice

## 2024-06-09 ENCOUNTER — Other Ambulatory Visit: Payer: Self-pay

## 2024-06-30 ENCOUNTER — Other Ambulatory Visit: Payer: Self-pay

## 2024-07-03 ENCOUNTER — Other Ambulatory Visit (HOSPITAL_COMMUNITY): Payer: Self-pay

## 2024-07-04 ENCOUNTER — Other Ambulatory Visit: Payer: Self-pay

## 2024-07-31 ENCOUNTER — Other Ambulatory Visit: Payer: Self-pay

## 2024-07-31 ENCOUNTER — Other Ambulatory Visit: Payer: Self-pay | Admitting: Internal Medicine

## 2024-07-31 DIAGNOSIS — E039 Hypothyroidism, unspecified: Secondary | ICD-10-CM

## 2024-08-01 ENCOUNTER — Other Ambulatory Visit: Payer: Self-pay | Admitting: Internal Medicine

## 2024-08-01 ENCOUNTER — Other Ambulatory Visit (HOSPITAL_COMMUNITY): Payer: Self-pay

## 2024-08-01 ENCOUNTER — Other Ambulatory Visit: Payer: Self-pay

## 2024-08-01 DIAGNOSIS — E039 Hypothyroidism, unspecified: Secondary | ICD-10-CM

## 2024-08-01 NOTE — Telephone Encounter (Signed)
 Copied from CRM [id number]. Topic: Clinical - Medication Question >> Aug 01, 2024  3:51 PM Dedra B wrote:  Reason for CRM: Pt called to follow up refill request for levothyroxine . Pt has two left. Pt said if something happens to him because he doesn't have his meds, he will sue.

## 2024-08-01 NOTE — Telephone Encounter (Unsigned)
 Copied from CRM [id number]. Topic: Clinical - Medication Refill >> Aug 01, 2024  1:31 PM Darren [name] wrote: Medication: levothyroxine  (SYNTHROID ) 150 MCG tablet  Has the patient contacted their pharmacy? Yes (Agent: If no, request that the patient contact the pharmacy for the refill. If patient does not wish to contact the pharmacy document the reason why and proceed with request.) (Agent: If yes, when and what did the pharmacy advise?)  This is the patient'[name] preferred pharmacy:  [facility] [address] [place] [address] Phone: [phone] Fax: [phone]    Is this the correct pharmacy for this prescription? Yes If no, delete pharmacy and type the correct one.   Has the prescription been filled recently? No  Is the patient out of the medication? Yes  Has the patient been seen for an appointment in the last year OR does the patient have an upcoming appointment? Yes  Can we respond through MyChart? No  Agent: Please be advised that Rx refills may take up to 3 business days. We ask that you follow-up with your pharmacy.

## 2024-08-02 ENCOUNTER — Other Ambulatory Visit: Payer: Self-pay

## 2024-08-02 MED ORDER — LEVOTHYROXINE SODIUM 150 MCG PO TABS
150.0000 ug | ORAL_TABLET | Freq: Every day | ORAL | 1 refills | Status: AC
  Filled 2024-08-02: qty 90, 90d supply, fill #0
  Filled 2024-10-30: qty 90, 90d supply, fill #1

## 2024-08-02 NOTE — Telephone Encounter (Signed)
 Sent!

## 2024-08-02 NOTE — Telephone Encounter (Signed)
 Called but no answer. LVM informing patient that requested medication has been sent to the pharmacy.

## 2024-08-03 ENCOUNTER — Other Ambulatory Visit: Payer: Self-pay

## 2024-08-04 ENCOUNTER — Other Ambulatory Visit: Payer: Self-pay

## 2024-09-11 ENCOUNTER — Other Ambulatory Visit: Payer: Self-pay

## 2024-09-12 ENCOUNTER — Other Ambulatory Visit: Payer: Self-pay

## 2024-09-26 ENCOUNTER — Other Ambulatory Visit (HOSPITAL_COMMUNITY): Payer: Self-pay

## 2024-09-26 ENCOUNTER — Other Ambulatory Visit: Payer: Self-pay

## 2024-10-30 ENCOUNTER — Other Ambulatory Visit: Payer: Self-pay

## 2024-10-31 ENCOUNTER — Telehealth: Payer: Self-pay | Admitting: Internal Medicine

## 2024-10-31 NOTE — Telephone Encounter (Signed)
 Copied from CRM [id number]. Topic: Clinical - Medication Refill >> Oct 30, 2024  2:40 PM   Myrick T wrote:  Pharmacist called stated manufacturer has changed from [employer] for [employer] and they need authorization to fill the script  Medication: levothyroxine  (SYNTHROID ) 150 MCG tablet  Has the patient contacted their pharmacy? No  This is the patient's preferred pharmacy:  [facility] [address] [place] [address] Phone: [phone] Fax: [phone]  Is this the correct pharmacy for this prescription? Yes  Has the prescription been filled recently? Yes  Is the patient out of the medication? Unknown  Has the patient been seen for an appointment in the last year OR does the patient have an upcoming appointment? Yes  Can we respond through MyChart? unknown  Agent: Please be advised that Rx refills may take up to 3 business days. We ask that you follow-up with your pharmacy.

## 2024-10-31 NOTE — Telephone Encounter (Signed)
 Called but no answer. LVM to call back.

## 2024-10-31 NOTE — Telephone Encounter (Signed)
 Okay to make the change. Let pt know that the manufacturer that his pharmacy normally uses for his thyroid  medication has changed. We will need to recheck Thyroid  level after being on it for 6 weeks. Will be due for OV at that time. So please schedule for OV in mid March.

## 2024-11-01 ENCOUNTER — Other Ambulatory Visit: Payer: Self-pay

## 2024-11-01 MED ORDER — MELOXICAM 7.5 MG PO TABS
7.5000 mg | ORAL_TABLET | Freq: Every day | ORAL | 1 refills | Status: AC
  Filled 2024-11-01: qty 30, 30d supply, fill #0

## 2024-11-01 NOTE — Telephone Encounter (Signed)
 Called & spoke to the patient. Verified name & DOB. Informed that requested prescription was sent to the preferred pharmacy. Patient expressed verbal understanding of all discussed.

## 2024-11-01 NOTE — Telephone Encounter (Signed)
 Called & spoke to the patient. Verified name & DOB. Informed that the manufacturer that his pharmacy normally uses for his thyroid  medication has changed. Will need to recheck thyroid  level after being on it for 6 weeks & will be due for OV visit at that time. Appointment scheduled for 12/18/24 per patient work scheduled.   Patient is complaining of arthritis pain in his hands but unable to come to additional appointment due to cost. Please advise if patient can receive a refill on Meloxicam  to help with inflammation. Patient would like to make sure that it does not cause drowsiness due to his job.

## 2024-11-01 NOTE — Addendum Note (Signed)
 Addended by: VICCI SOBER B on: 11/01/2024 12:21 PM   Modules accepted: Orders

## 2024-11-01 NOTE — Telephone Encounter (Signed)
 RF sent on Meloxicam  to our pharmacy.

## 2024-11-02 ENCOUNTER — Other Ambulatory Visit: Payer: Self-pay

## 2024-11-02 ENCOUNTER — Other Ambulatory Visit (HOSPITAL_COMMUNITY): Payer: Self-pay

## 2024-12-18 ENCOUNTER — Ambulatory Visit: Payer: Self-pay | Admitting: Internal Medicine
